# Patient Record
Sex: Male | Born: 1945 | Race: White | Hispanic: No | Smoking: Never smoker
Health system: Southern US, Community
[De-identification: ages and names within clinical notes are randomized; demographics above are authoritative.]

## PROBLEM LIST (undated history)

## (undated) DIAGNOSIS — F32A Depression, unspecified: Secondary | ICD-10-CM

## (undated) DIAGNOSIS — Z22322 Carrier or suspected carrier of Methicillin resistant Staphylococcus aureus: Secondary | ICD-10-CM

## (undated) DIAGNOSIS — R3911 Hesitancy of micturition: Secondary | ICD-10-CM

## (undated) DIAGNOSIS — R600 Localized edema: Secondary | ICD-10-CM

## (undated) DIAGNOSIS — F329 Major depressive disorder, single episode, unspecified: Secondary | ICD-10-CM

## (undated) DIAGNOSIS — F419 Anxiety disorder, unspecified: Secondary | ICD-10-CM

## (undated) DIAGNOSIS — N400 Enlarged prostate without lower urinary tract symptoms: Secondary | ICD-10-CM

## (undated) DIAGNOSIS — E876 Hypokalemia: Secondary | ICD-10-CM

## (undated) DIAGNOSIS — R5381 Other malaise: Secondary | ICD-10-CM

## (undated) DIAGNOSIS — E46 Unspecified protein-calorie malnutrition: Secondary | ICD-10-CM

## (undated) DIAGNOSIS — K5901 Slow transit constipation: Secondary | ICD-10-CM

## (undated) DIAGNOSIS — F1090 Alcohol use, unspecified, uncomplicated: Secondary | ICD-10-CM

## (undated) DIAGNOSIS — D638 Anemia in other chronic diseases classified elsewhere: Secondary | ICD-10-CM

## (undated) DIAGNOSIS — M1990 Unspecified osteoarthritis, unspecified site: Secondary | ICD-10-CM

## (undated) DIAGNOSIS — C801 Malignant (primary) neoplasm, unspecified: Secondary | ICD-10-CM

## (undated) DIAGNOSIS — IMO0002 Reserved for concepts with insufficient information to code with codable children: Secondary | ICD-10-CM

## (undated) DIAGNOSIS — K219 Gastro-esophageal reflux disease without esophagitis: Secondary | ICD-10-CM

## (undated) DIAGNOSIS — Z8739 Personal history of other diseases of the musculoskeletal system and connective tissue: Secondary | ICD-10-CM

## (undated) HISTORY — DX: Anemia in other chronic diseases classified elsewhere: D63.8

## (undated) HISTORY — PX: TONSILLECTOMY: SUR1361

## (undated) HISTORY — DX: Slow transit constipation: K59.01

## (undated) HISTORY — PX: TRIGGER FINGER RELEASE: SHX641

## (undated) HISTORY — DX: Other malaise: R53.81

## (undated) HISTORY — PX: APPENDECTOMY: SHX54

## (undated) HISTORY — DX: Carrier or suspected carrier of methicillin resistant Staphylococcus aureus: Z22.322

## (undated) HISTORY — DX: Unspecified protein-calorie malnutrition: E46

## (undated) HISTORY — PX: KNEE ARTHROSCOPY: SHX127

## (undated) HISTORY — DX: Reserved for concepts with insufficient information to code with codable children: IMO0002

## (undated) HISTORY — PX: CARPAL TUNNEL RELEASE: SHX101

## (undated) HISTORY — PX: PILONIDAL CYST / SINUS EXCISION: SUR543

## (undated) HISTORY — DX: Alcohol use, unspecified, uncomplicated: F10.90

## (undated) HISTORY — PX: KNEE SURGERY: SHX244

## (undated) HISTORY — DX: Benign prostatic hyperplasia without lower urinary tract symptoms: N40.0

## (undated) HISTORY — DX: Localized edema: R60.0

## (undated) HISTORY — DX: Hypokalemia: E87.6

## (undated) HISTORY — PX: JOINT REPLACEMENT: SHX530

## (undated) HISTORY — PX: ENUCLEATION: SHX628

---

## 2002-12-14 ENCOUNTER — Ambulatory Visit (HOSPITAL_BASED_OUTPATIENT_CLINIC_OR_DEPARTMENT_OTHER): Admission: RE | Admit: 2002-12-14 | Discharge: 2002-12-14 | Payer: Self-pay | Admitting: Orthopedic Surgery

## 2002-12-29 ENCOUNTER — Ambulatory Visit (HOSPITAL_BASED_OUTPATIENT_CLINIC_OR_DEPARTMENT_OTHER): Admission: RE | Admit: 2002-12-29 | Discharge: 2002-12-29 | Payer: Self-pay | Admitting: Orthopedic Surgery

## 2003-01-20 ENCOUNTER — Inpatient Hospital Stay (HOSPITAL_COMMUNITY): Admission: RE | Admit: 2003-01-20 | Discharge: 2003-01-26 | Payer: Self-pay | Admitting: Orthopedic Surgery

## 2003-01-20 ENCOUNTER — Encounter (INDEPENDENT_AMBULATORY_CARE_PROVIDER_SITE_OTHER): Payer: Self-pay | Admitting: *Deleted

## 2003-01-20 ENCOUNTER — Encounter: Payer: Self-pay | Admitting: Orthopedic Surgery

## 2003-01-25 ENCOUNTER — Encounter: Payer: Self-pay | Admitting: Orthopedic Surgery

## 2004-09-20 ENCOUNTER — Ambulatory Visit (HOSPITAL_COMMUNITY): Admission: RE | Admit: 2004-09-20 | Discharge: 2004-09-20 | Payer: Self-pay | Admitting: Emergency Medicine

## 2004-11-16 ENCOUNTER — Encounter: Admission: RE | Admit: 2004-11-16 | Discharge: 2004-11-16 | Payer: Self-pay | Admitting: Neurosurgery

## 2005-03-08 ENCOUNTER — Encounter
Admission: RE | Admit: 2005-03-08 | Discharge: 2005-06-06 | Payer: Self-pay | Admitting: Physical Medicine & Rehabilitation

## 2005-03-12 ENCOUNTER — Ambulatory Visit: Payer: Self-pay | Admitting: Physical Medicine & Rehabilitation

## 2005-05-15 ENCOUNTER — Ambulatory Visit: Payer: Self-pay | Admitting: Physical Medicine & Rehabilitation

## 2006-05-08 ENCOUNTER — Ambulatory Visit (HOSPITAL_BASED_OUTPATIENT_CLINIC_OR_DEPARTMENT_OTHER): Admission: RE | Admit: 2006-05-08 | Discharge: 2006-05-08 | Payer: Self-pay | Admitting: Orthopedic Surgery

## 2009-11-15 ENCOUNTER — Encounter: Admission: RE | Admit: 2009-11-15 | Discharge: 2009-11-15 | Payer: Self-pay | Admitting: Emergency Medicine

## 2011-04-26 NOTE — Op Note (Signed)
Jesus Daniel, FITZHENRY                ACCOUNT NO.:  [id number]   MEDICAL RECORD NO.:  [id number]          PATIENT TYPE:  AMB   LOCATION:  DSC                          FACILITY:  [facility]   PHYSICIAN:  Loreta Ave, M.D. DATE OF BIRTH:  07-28-1946   DATE OF PROCEDURE:  05/08/2006  DATE OF DISCHARGE:                                 OPERATIVE REPORT   PREOPERATIVE DIAGNOSIS:  Right knee medial meniscal tear.   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  Grade 2 chondral changes medial and lateral compartment.   OPERATION PERFORMED:  Right knee examination under anesthesia, arthroscopy,  partial medial meniscectomy.  Extensive chondromalacia patella  patellofemoral joint, removal of chondral loose bodies.   SURGEON:  Loreta Ave, M.D.   ASSISTANT:  Genene Churn. Denton Meek.   ANESTHESIA:  General.   BLOOD LOSS:  Minimal.   TOURNIQUET:  Not employed.   SPECIMENS:  None.   CULTURES:  None.   COMPLICATIONS:  None.   DRESSING:  Soft compressive.   DESCRIPTION OF PROCEDURE:  The patient was brought to the operating room and  after adequate anesthesia had been obtained, the right knee examined.  Patellofemoral crepitus but good tracking, no instability, ligaments stable.  Tourniquet and leg holder applied.  Leg prepped and draped in the usual  sterile fashion.  Three portals were created, one superolateral, one each  medial and lateral parapatellar.  Inflow catheter introduced.  Knee  distended.  Arthroscope introduced, knee inspected.  Good patellofemoral  tracking.  Grade 3 change on the patella at the __________ patella debrided.  The trochlea had extensive grade 3 and even some grade 4 changes throughout  full motion.  Chondroplasty to a stable surface.  This was so diffuse that  microfracture is really not indicated.  Hypertrophic synovial tissue,  chondral loose bodies  throughout removed.  Cruciate ligaments intact.  Medial compartment only grade 1 and 2 changes.  Marked complex tearing  entire posterior half medial meniscus.  Really looking at the entire  meniscus was there and had a complex tear.  Posterior half removed.  His  size and the tightness of his knee made this difficult but I could get a  good visualization and removal of posterior half back to stable tissue.  Tapered into remaining meniscus.  Lateral compartment, lateral meniscus all  otherwise looked excellent.  At  completion, the entire knee examined.  No other findings appreciated.  Instruments and fluid removed.  Portals of the knee injected with Marcaine.  Portals were closed with 4-0 nylon.  Sterile compressive dressing applied.  Anesthesia reversed.  Brought to recovery room.  Tolerated surgery well.  No  complications.      Loreta Ave, M.D.  Electronically Signed     DFM/MEDQ  D:  05/08/2006  T:  05/08/2006  Job:  [id number]

## 2011-04-26 NOTE — Procedures (Signed)
Jesus Daniel, Jesus Daniel                ACCOUNT NO.:  [id number]   MEDICAL RECORD NO.:  [id number]          PATIENT TYPE:  REC   LOCATION:  TPC                          FACILITY:  [facility]   PHYSICIAN:  Erick Colace, M.D.DATE OF BIRTH:  Oct 16, 1946   DATE OF PROCEDURE:  04/01/2005  DATE OF DISCHARGE:                                 OPERATIVE REPORT   DATE OF SERVICE:  April 01, 2005.   MEDICAL RECORD NUMBER:  [id number].   DATE OF BIRTH:  1946/08/21.   PROCEDURE:  Right L4 transforaminal epidural sternoid injection under  fluoroscopic guidance.   INDICATION:  Right greater than left-sided low back pain and right thigh  pain.   Only 40% relief with medications, 7/10 pain.  He has had extensive  conservative care.   Informed consent was obtained after describing the risks and benefits of the  procedure to the patient.  These included bleeding, bruising, infection,  loss of bowel and bladder function and temporary or permanent paralysis.  The patient elected to proceed.   DESCRIPTION OF PROCEDURE:  The patient was placed prone on the fluoroscopy  table.  Betadine prep and sterile drape.  A 25 gauge 1-1/4 inch needle was  used to anesthetize the skin and subcutaneous tissues with 1% lidocaine x 2  mL.  Then an 18 gauge 1-1/2 inch introducer needle was utilized and through  this a 22 gauge 5 inch spinal needle was inserted using a bull's eye view in  oblique position.  The needle was advanced and adjusted into the right L4-5  foramen.  The patient experienced some of his typical thigh pain.  The  needle was slightly withdrawn.  AP and lateral views confirmed proper  location.  Omnipaque 180 x 0.5 mL demonstrated good outline of the nerve, as  well as travel up into the epidural area.  Then a solution containing 1 mL  of 40 mg/mL Kenalog plus 1% methylparaben-free lidocaine x 2 mL were  injected.  The patient tolerated the procedure well.  Post injection  instructions given.  The  patient will follow up in three weeks.  If still  good pain relief, will go ahead and not reinject.  If he does have temporary  pain relief, will reinject the same site and if he has suboptimal pain  relief, will have to go down to the L5 level as this appears to be involved  on the MRI.      AEK/MEDQ  D:  04/01/2005 10:32:11  T:  04/01/2005 12:39:11  Job:  [id number]

## 2011-04-26 NOTE — Op Note (Signed)
NAME:  Jesus Daniel, Jesus Daniel                          ACCOUNT NO.:  [id number]   MEDICAL RECORD NO.:  [id number]                   PATIENT TYPE:  OIB   LOCATION:  5015                                 FACILITY:  [facility]   PHYSICIAN:  Dionne Ano. [name], M.D.         DATE OF BIRTH:  [birth year]   DATE OF PROCEDURE:  01/22/2003  DATE OF DISCHARGE:                                 OPERATIVE REPORT   PREOPERATIVE DIAGNOSIS:  Right ring finger peroneal flexor tenosynovitis  with draining sinus, status post incision and drainage January 20, 2003.  The patient presents for secondary wash out and debridement is necessary.   PROCEDURE:  1. Incision and drainage, skin and subcutaneous tissue and flexor tendon     tissue as well as sheath, right ring finger.  2. Synovectomy, flexor digitorum profundus, in the form of a tenosynovectomy     with removal of large amounts of gouty deposits, intratendinous and in     the soft tissues.   SURGEON:  Dionne Ano. Amanda Pea, M.D.   ASSISTANT:  None.   COMPLICATIONS:  None.   ANESTHESIA:  LMA general.   TOURNIQUET TIME:  0.   DRAINS:  Two.   INDICATIONS FOR PROCEDURE:  This patient presents for secondary wash out.  He was incised and drained 48 hours ago secondary to peroneal flexor  tenosynovitis as described in my office note and operative note January 20, 2003.  I have discussed with him all risks and benefits and he desires to  proceed.   OPERATIVE FINDINGS:  This patient had a gouty exudate in the soft tissues  and flexor digitorum profundus.  He had a tense tenosynovitis noted as well.  This was debrided to a clean wound.  I took the tourniquet down during this  and the finger maintained refill.  He still had soft tissue swelling that  was rather exuberant but did not have any signs of extending infection at  this juncture.  The patient does have intratendinous, gouty investments in  the flexor digitorum profundus distally which is worrisome.   The flexor  digitorum profundus is incompetent and was debrided at the first I&D.   OPERATIVE PROCEDURE:  The patient was seen by myself and anesthesia in the  preop holding area.  After a thorough discussion, he was taken to the  operative suite and underwent a smooth induction of general LMA anesthetic.  Following this, the patient was placed supine, appropriately padded, and  prepped and draped in the usual sterile fashion with Betadine scrub and  paint.  Once this was done, I opened the loosely tacked skin and took  intraoperative cultures.  Conditions were more favorable than prior I&D 48  hours ago.  The patient had at this time debridement of skin and  subcutaneous tissue and tendinous tissue.  This was done without difficulty.  Following this, he underwent a tenosynovectomy and tenolysis with removal of  intratendinous gouty deposits distally about the FDP tendon.  Thus, an I&D  was performed as well as a tenosynovectomy with removal of gouty deposits.  I used a combination curette, Therapist, nutritional, and pick ups as well as  scissor tips for the debridement.  I ensured neurovascular bundles were  protected at all times.  The patient tolerated this well and there were no  complications.  He maintained good refill but still had significant swelling  within the soft tissues as expected.  Copious irrigation was applied to the  wound in the form of saline, greater than four liters was placed.  Following  this, I loosely tacked the wound and placed two vessel loop drains.  The  patient was splinted in a position of function and following this, was  splinted with a volar plaster splint.  He was extubated and taken to the  recovery room in stable condition.  He will be continued on IV antibiotics,  elevation, and close observation.  We will plan the final wash out Monday  with definitive closure of a loose nature if conditions are favorable.  We  will continue aggressive approach to try and  return this gentleman to a  quiescent state in terms of the infection and to try and maximize the  potential for use of his ring finger.  All questions have been encouraged  and answered.                                               Dionne Ano. [name], M.D.    [facility]  D:  01/22/2003  T:  01/22/2003  Job:  [id number]   cc:   Almedia Balls. Ranell Patrick, M.D.  [address]  [place]  [place]  [address]  Fax: [phone]

## 2011-04-26 NOTE — Op Note (Signed)
NAME:  LENY, MOROZOV                          ACCOUNT NO.:  [id number]   MEDICAL RECORD NO.:  [id number]                   PATIENT TYPE:  AMB   LOCATION:  DSC                                  FACILITY:  [facility]   PHYSICIAN:  Almedia Balls. Ranell Patrick, M.D.              DATE OF BIRTH:  Apr 29, 1946   DATE OF PROCEDURE:  DATE OF DISCHARGE:  12/29/2002                                 OPERATIVE REPORT   PREOPERATIVE DIAGNOSIS:  Right ring finger infection.   POSTOPERATIVE DIAGNOSIS:  Right ring finger infection.   PROCEDURE:  Right finger irrigation and drainage utilizing Bruner incision  and A-1 pulley incision.   SURGEON:  Almedia Balls. Ranell Patrick, M.D.   FIRST ASSISTANT:  Clarene Reamer, P.A.-C.   ANESTHESIA:  Regional anesthesia with a carpal tunnel block and MAC was  utilized.   ESTIMATED BLOOD LOSS:  None.   TOURNIQUET TIME:  20 minutes.   INSTRUMENT COUNT:  Correct.   COMPLICATIONS:  There were no complications.   ANTIBIOTICS:  Antibiotics were given after cultures were obtained.  Cultures  were sent.   INDICATIONS:  The patient is a 65 year old gentleman who underwent carpal  tunnel release, ring finger and small finger A1 trigger release  approximately one week ago.  The patient presented to the occupational  therapist with some swelling and persistent drainage from his ring finger A1  incision.  The patient also has swelling distally in that finger as well.  He is seen back in orthopedics where he is felt to have an acute infection.  The patient was brought to the operating room for I&D.   DESCRIPTION OF PROCEDURE:  After an adequate level of anesthesia was  achieved, the patient was placed supine on the operating table.  A  nonsterile tourniquet was placed on the forearm.  The right hand was prepped  and draped in the usual sterile fashion.  The hand was elevated for two to  three minutes.  The tourniquet was inflated to 275 mmHg.  A Bruner incision  was created out over the  middle phalanx where there was noted to be redness  and swelling.  There was immediate pus noted.  This was culture, both  anaerobic an aerobic and Gram stain.  Dissection was carried down to the  flexor sheath.  The infection seemed to go directly down to the area just  distal to the A-4 pulley where the tendon was noted to be in good condition  and intact.  The sheath was opened slightly to allow an angiocatheter to be  placed into the sheath for thorough irrigation.  The A-1 pulley incision was  recreated utilizing the knife and blunt dissection with the hemostat.  The  tendon was inspected. There was noted to be some suture material remaining  from the tendon that was repaired.  It was done due to calcific deposits  which developed inside the tendon which  were removed at the initial surgery.  There was noted to be significant purulence noted in the incision which was  thoroughly irrigated out using double antibiotic irrigation.  The tendon  sheath was irrigated using the catheter technique.  Once a complete  irrigation and removal of all foreign material and necrotic material was  performed.  The wounds looked benign.  They were sutured using 4-0 nylon  suture.  The patient was immobilized in a short arm splint, and the  tourniquet was deflated after 25 minutes.  The patient tolerated the surgery  well and was given IV antibiotics, Ancef, following the cultures and will be  placed on oral antibiotics.                                                Almedia Balls. Ranell Patrick, M.D.    SRN/MEDQ  D:  12/31/2002  T:  01/01/2003  Job:  [id number]

## 2011-04-26 NOTE — Op Note (Signed)
NAME:  Jesus Daniel, Jesus Daniel                          ACCOUNT NO.:  [id number]   MEDICAL RECORD NO.:  [id number]                   PATIENT TYPE:  AMB   LOCATION:  DSC                                  FACILITY:  [facility]   PHYSICIAN:  Almedia Balls. Ranell Patrick, M.D.              DATE OF BIRTH:  [birth year]   DATE OF PROCEDURE:  12/14/2002  DATE OF DISCHARGE:                                 OPERATIVE REPORT   PREOPERATIVE DIAGNOSES:  Right hand carpal tunnel syndrome and triggering of  ring finger and small finger.Marland Kitchen   POSTOPERATIVE DIAGNOSES:  Right hand carpal tunnel syndrome and triggering  of ring finger and small finger.   PROCEDURES:  Right carpal tunnel release and A1 pulley release, ring finger  and small finger.   SURGEON:  Almedia Balls. Ranell Patrick, M.D.   ANESTHESIA:  Local anesthesia plus MAC was used.   ESTIMATED BLOOD LOSS:  Minimal.   TOURNIQUET TIME:  41 minutes.   COUNTS:  Instrument count was correct.   COMPLICATIONS:  There were no complications.   MEDICATIONS:  Preoperative antibiotics were given.   INDICATIONS:  The patient is a 65 year old male who presents complaining of  worsening numbness and pain in the right hand.  The patient has a positive  EMG and nerve conduction studies documenting carpal tunnel syndrome.  Despite conservative management, the patient's condition is worse and the  patient desires operative carpal tunnel release.  Informed consent was  obtained.  Risks and benefits were discussed.  Options for management were  discussed.  In addition, the patient has noted worsening triggering in his  ring finger to the point where he cannot fully extend his PIP joint.  He has  developed a slight contracture there.  Occupational therapy has helped  somewhat with that.  In addition, he has had trouble fully flexing the  finger and making a grip.  He has had some small finger triggering as well  and is concerned about that.  In discussing with the patient, he elected to  proceed with A1 pulley release to relieve his triggering in both the ring  and small fingers.  This will all be done at the same procedure.   DESCRIPTION OF PROCEDURE:  After an adequate level of anesthesia was  achieved, the patient was positioned in the supine position and a nonsterile  tourniquet was placed on the right forearm.  The right hand was then prepped  and draped in its entirety in the usual sterile fashion.  A longitudinal  skin incision was made in line with the fourth ray in the palm.  This was  done under loupe magnification.  Dissection was carried sharply down through  the subcutaneous tissues.  The superficial palmar fascia was identified and  divided in line with the skin incision.  Dissection was carried down to the  transverse carpal ligament, which was identified and incised sharply under  direct  loupe magnification.  Care was taken to not injure the median nerve,  which was easily identifiable underneath the transverse carpal ligament and  protected using a Kelly clamp.  Dissection was carried into the midpalmar  space and into the superficial forearm fascia, which was also divided, thus  completing the carpal tunnel release.  The nerve was inspected and noted to  be intact.  Some small fibrous bands were released overlying the nerve,  which seemed to be pressing on the nerve as well.  At this point attention  was directed toward the A1 pulleys of both the ring and small fingers.  Transverse skin incisions were made directly overlying the A1 pulleys.  Again loupe magnification was utilized.  Blunt dissection was carried out  below the skin and the A1 pulleys were visualized and divided under direct  visualization.  There as noted to be extensive tendon degeneration and  fraying at the level of the A1 pulley.  There appeared to be some calcific  deposits present within the tendon itself, and there was a breach in the  tendon on the ring finger.  The calcium was  lavaged out and the rent in the  tendon was repaired using 2-0 Vicryl suture.  The patient was asked while  awake to range his ring and small fingers.  No triggering was noted.  The  wounds were sutured using 4-0 nylon.  Sterile dressings were applied,  followed by a short-arm splint.  The patient was taken to the recovery room,  having tolerated the surgery well.                                               Almedia Balls. Ranell Patrick, M.D.    SRN/MEDQ  D:  12/14/2002  T:  12/14/2002  Job:  [id number]
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
## 2011-04-26 NOTE — Op Note (Signed)
NAME:  Jesus Daniel, Jesus Daniel                          ACCOUNT NO.:  [id number]   MEDICAL RECORD NO.:  [id number]                   PATIENT TYPE:  AMB   LOCATION:  DSC                                  FACILITY:  [facility]   PHYSICIAN:  Almedia Balls. Ranell Patrick, M.D.              DATE OF BIRTH:  Mar 13, 1946   DATE OF PROCEDURE:  12/29/2002  DATE OF DISCHARGE:  12/29/2002                                 OPERATIVE REPORT   PREOPERATIVE DIAGNOSIS:  Infection, right ring finger, post A1 trigger  release.   POSTOPERATIVE DIAGNOSES:  1. Infection, right ring finger.  2. Tophaceous gouty deposits, right ring finger.   PROCEDURE:  Incision and drainage of right ring finger.   SURGEON:  Almedia Balls. Ranell Patrick, M.D.   ASSISTANT:  Clarene Reamer, P.A.-C.   ANESTHESIA:  Regional anesthesia plus MAC was utilized.   ESTIMATED BLOOD LOSS:  Minimal.   TOURNIQUET TIME:  32 minutes.   Instrument count was correct.  There were no complications.   Antibiotics were given after cultures were sent.   INDICATIONS:  The patient is a 65 year old male who is now 2-1/2 weeks out  from carpal tunnel release and A1 release at the right ring finger and small  finger.  The patient has had a postoperative course complicated by drainage  from his A1 incision to the ring finger as well as swelling present along  the flexor sheath near the middle phalanx of his ring finger.  The patient  presents afebrile, complaining of mild-to-moderate pain and swelling in the  right ring finger.  Due to concerns over likely infection with persistent  drainage from his A1 incision and failure of this incision to seal as well  as the swelling and erythema distally, he has decided to proceed to the  operating room for irrigation and debridement of his finger for presumed  infection.   DESCRIPTION OF PROCEDURE:  After an adequate level of anesthesia was  achieved, the patient was positioned supine on the operating table.  A non-  sterile  tourniquet was placed on the right proximal arm.  The right arm was  then prepped and draped in its entirety in the usual sterile fashion.  The  patient's prior A1 incision was re-opened utilizing a sharp knife.  A  hemostat was then used to spread down to the level of the A1 pulley.  Care  was taken to be wary of the digital artery and nerve bundles, passing both  radially and ulnarly to the incision.  Blunt retractors were utilized.  There was noted to be an area of necrosis and significant tophaceous  deposits present in the A1 incision.  These were all debrided sharply  utilizing tenotomy scissors under loupe magnification.  A thorough  debridement of all devitalized tissue was performed.  At this point, a  Bruner incision was created overlying the middle phalanx distally on the  finger.  This was done utilizing a 15-blade scalpel.  Dissection was carried  sharply down through subcutaneous tissues using tenotomy scissors such that  a full-thickness flap could be opened over the flexor sheath.  Upon entering  the level of the flexor sheath, a large amount of tophaceous purulent  material was expressed which was sent for culture, both aerobic and  anaerobic as well as a stat Gram-stain.  The full-thickness flap was  retracted, giving good visualization of the middle phalanx.  Care was taken  to preserve the A4 pulley, and the sheath was opened up allowing for  thorough inspection of the tendon.  There was noted to be tophaceous  deposits within the tendon sheath and within the tendon, the flexor  digitorum superficialis tendon itself.  Some attritional damage to the  tendon was noted; however, the tendon was basically in continuity at this  point.  An 18-gauge Angiocath catheter was utilized to irrigate, with double-  antibiotic irrigation, the tendon sheath until the irrigation was noted to  be clear coming from the palmar incision.  Approximately two liters of  irrigation were utilized  during the surgery.  Again devitalized tissue and  tophaceous material were removed from both incisions.  At the completion of  the irrigation and debridement, the tendons appeared clean with no evidence  of purulent material or tophaceous material.  The patient's A4 pulley was  noted to be intact.  The opening in the patient's tendon sheath was left  open to drain.  The distal incision was closed using nylon suture, and a  gauze wick was placed into the palmar incision to facilitate drainage in the  palmar incision.  The patient's A1 incision was not closed but left open to  drain.  A short-arm splint was applied to immobilize the hand and the ring  finger.  The patient was then awakened and taken to the recovery room after  deflating the tourniquet at 32 minutes.  We will check the cultures and  place him on appropriate IV antibiotics.                                               Almedia Balls. Ranell Patrick, M.D.    SRN/MEDQ  D:  01/25/2003  T:  01/25/2003  Job:  [id number]

## 2011-04-26 NOTE — Op Note (Signed)
NAME:  Jesus Daniel, Jesus Daniel                          ACCOUNT NO.:  [id number]   MEDICAL RECORD NO.:  [id number]                   PATIENT TYPE:  OIB   LOCATION:  5015                                 FACILITY:  [facility]   PHYSICIAN:  Dionne Ano. [name], M.D.         DATE OF BIRTH:  1946/04/07   DATE OF PROCEDURE:  DATE OF DISCHARGE:                                 OPERATIVE REPORT   PREOPERATIVE DIAGNOSES:  1. Right ring finger purulent flexor tenosynovitis with draining sinus and     cultures less than 24 hours ago revealing Gram positive cocci.  2. History of previous A1 pulley release with postoperative course requiring     I&D, 15 days after the index procedure.   POSTOPERATIVE DIAGNOSES:  Purulent flexor tenosynovitis right ring finger  with abundant tophaceous and purulent material note.  Flexor digitorum  profundus was noted to have attritional tearing in it secondary to the  process.  The flexor digitorum superficialis was ruptured secondary to  attritional tearing to the process.   PROCEDURE:  1. Flexor digitorum profundus tenolysis and tenosynovectomy right ring     finger.  2. Flexor digitorum superficialis debridement and tenosynovectomy with     resection of the proximal stump which was allowed to retract.  This was     an incompetent tendon.  3. Irrigation and debridement right ring finger skin, subcutaneous tissue,     muscle and tendinous tissue.  4. Exploration of digital nerve and artery right ring finger.   SURGEON:  Dionne Ano. Amanda Pea, M.D.   ASSISTANT:  Karie Chimera, P.A.-C.   DRAINS:  Two.   COMPLICATIONS:  None.   ESTIMATED BLOOD LOSS:  Minimal.   ANESTHESIA:  Wrist block with heavy IV sedation.   TOURNIQUET TIME:  1 hour.   INDICATIONS:  This patient is a 65 year old male who  underwent carpal  tunnel release and ring finger A1 pulley release December 14, 2002.  His  course was complicated by infection which was noted and a drain December 29, 2002,  by Dr. Ranell Patrick.  The remainder of the procedure was done by Dr. Beverely Low as well.  Following this I&D on December 29, 2002, the patient was  followed by Dr. Ranell Patrick.  I was asked to see the patient today due to the  fact that this patient had greater than 24 hours of worsening swelling pain  and discomfort.  I have evaluated him in my office this morning and have  scheduled him for surgery this afternoon due to positive Kanavel's signs, a  draining sign and a culture taken by Leilani Able, P.A.-C. in the office at  [facility] that revealed Gram-positive cocci, less than 12  hours ago.  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  I have  discussed all issues with the patient and his  family.  The patient is  understandably anxious about his finger as I have discussed with him there  are risks of bleeding, infection, anesthesia, damage to normal structures  and failure of surgery to accomplish intended goals in relieving symptoms  and restoring function.  There is also a risk of finger amputation due to  the infectious process.  The patient and I also discussed possible poor  function in the finger due to the fact that he may have significant  attritional tearing along the flexor tendon system.  I went over risks and  benefits with him at length and all questions were encouraged and answered.  I also discussed all risks and benefits with his wife and family.  Our goal  is to try and eradicate this infection and give him the most functional  finger based upon conditions.  The patient understands that is absolutely no  guarantees.  I have had a very long and detailed conversation, greater than  one hour with the patient both in my office and preoperatively in the  holding area.  I have also singularly discussed all issues with his wife.   OPERATIVE FINDINGS:  This patient had purulent flexor  tenosynovitis.  He had  a large investment of tophaceous gouty deposits along the flexor tendon  sheath.  The FDS was attritionally torn and not functional and was debrided  and resected and allowed to retract proximally.  The flexor digitorum  profundus was intact proximally.  Distally it had a significant fraying,  however, the distal attachments appeared to be somewhat intact although  certainly not normal by any means.  The ulnar digital artery was therefore  explored and due to the proximity and prior incisions.  These were then  tagged carefully and I dissected this.  Multiple cultures were taken.   DESCRIPTION OF PROCEDURE:  The patient was seen by myself and anesthesia.  A  lengthy discussion ensued preoperatively followed by a wrist block by Dr.  Laverle Hobby.  Once this was done, the patient was taken to the operative  suite.  I did previously hang IV antibiotics on this gentleman, and he was  previously on Keflex for less than 16 hours prior to surgery.  The patient  and I discussed the procedure at length preoperatively.  Once in the  operative suite, he was laid supine on the table, appropriately padded,  prepped and draped in the usual sterile fashion with heavy IV sedation.  The  tourniquet was applied to the upper extremity.  Once this was done, I made  outlying marks and examined the finger.  The finger did have a draining  sinus tract overlying the A1 pulley.  Purulent material had drained, and  this was cultured.  Once this was done, the sterile field was secured and  incision was made.  The patient had a draining incision made up the finger.  I based this primarily ulnarly so that I could lengthen this if necessary.  The skin was rather tight and erythematous.  In addition to this, there was  a large amount of scar tissue and edema with proteinaceous exudate noted in  the soft tissue subcu.  I opened the finger proximally.  I first noted purulent and tophaceous  deposits.  I cultured this readily with aerobic and  anaerobic cultures.  Following this, skin flaps were elevated and I  identified the flexor tendon sheaths and the flexor tendons themselves from  the DIP crease and beyond to the mid  palm region well above the A1 pulley.  Given the proximity of the digital nerve and artery I identified these  structures and dissected them under loop magnification and protected them at  all times.  The radial digital nerve and artery were protected with soft  tissue.  There was a large amount of crusting necrotic tissue which I  removed in the A1 pulley region as well as throughout the tendon sheath.  There was a huge amount of synovitis and tophaceous deposits as well as  purulent fluid.  This was cultured to my satisfaction.  I obtained  additional cultures at this time.  Thus, three sets of cultures were taken.  The patient had the previous A1 pulley resection site identified.  This  resection site was filled with synovitis and tophaceous fluid.  This was  debrided and a A3 sheath incision was made and the A3 sheath was resected.  I took care to preserve the A2 and A4 pulleys.  Following this I performed  an extensive debridement of nonviable tissue.  This debridement included the  skin and subcutaneous tissue and muscle type tissue.  An extensive  tenosynovectomy and tenolysis of the SVS tendon was performed removing all  nonviable tissue from it.  There was noted significant fraying of the flexor  digitorum profundus distally.  The flexor digitorum superficialis was  attritionally torn and essentially ruptured.  Thus this was debrided and  allowed to retract proximally.  I pulled the tendon distally until healthy  appearing fibers were present and allowed this to retract proximally after  it was severed.  No purulent material was removed.  Following this, I  performed copious irrigation with greater than 4-5 liters of fluid.  This  was poured into the  wound.  I removed all infectious appearing tissue and  necrotic areas.  Once this was done, the tourniquet was deflated.  I sure  note that the tourniquet was deflated during the lateral half of the  irrigation and the finger pinked up well without difficulty.  The patient  did have some bleeding edges which were controlled with pressure and bipolar  electrocautery.  I then loosely closed the wound over a #17 TLS drain and  placed multiple vessel loops in the finger.  The patient tolerated this  well.  Following this, I then placed Xeroform over the proximal drain site  and Adaptic over the skin.  I made sure that the flexor tendon was covered  but left the wound fairly loosely closed to allow free egress of fluid.  The  patient had wet-to-dry dressing and Adaptic placed and following this a  splint was applied.  The drain was hooked up to suction, and the patient  awoke from anesthesia.  Drapes were removed and he was taken to the recovery room.  We will plan elevation, IV antibiotics, close management and return  him to the operative theatre Saturday for second I&D.  I have discussed with  him that this is likely going to be a rather drawn out process given the  infection and the intraoperative conditions noted.  I could not guarantee  him salvage of his finger, but I assured him that we will do everything in  our power to try and restore function and health to the finger and hand.  I  have discussed with he and the family all issues.  Postoperative plans were  written in the chart.  All questions have been encouraged and answered.  Dionne Ano. [name], M.D.    [facility]  D:  01/20/2003  T:  01/21/2003  Job:  [id number]   cc:   Almedia Balls. Ranell Patrick, M.D.  [address]  [place]  [place]  [address]  Fax: [phone]

## 2011-04-26 NOTE — Procedures (Signed)
[name], Jesus Daniel                ACCOUNT NO.:  [id number]   MEDICAL RECORD NO.:  [id number]          PATIENT TYPE:  REC   LOCATION:  TPC                          FACILITY:  [facility]   PHYSICIAN:  Erick Colace, M.D.DATE OF BIRTH:  March 06, 1946   DATE OF PROCEDURE:  04/22/2005  DATE OF DISCHARGE:                                 OPERATIVE REPORT   PROCEDURE:  Right L4 transforaminal epidural steroid injection under  fluoroscopic guidance.   INDICATIONS:  Right L4 radiculopathy with MRI findings showing L4-5 disk  compressing L4 and to a lesser extent L5 nerve root.   Informed consent was obtained after describing risks and benefits of the  procedure.  These include bleeding, bruising, infection, loss of bowel and  bladder function, death, seizures, with potential benefits of improvement of  pain.  Had previous improvement for several days after the last injection  approximately one month ago.   The patient placed prone on the fluoroscopy table, Betadine prep, sterile  drape.  A 25-gauge 1-1/2 inch needle was used to anesthetize skin and subcu  tissue with 1% lidocaine x2 mL.  Then a 22-gauge five-inch spinal needle was  inserted under fluoroscopic guidance into the right L4-5 foramen on the  right side.  Both AP, lateral and oblique images were utilized.  Once proper  needle position was obtained, then Omnipaque 180 with IV extension tubing  was injected under live fluoroscopy.  Good epidural and nerve root flow was  seen and then using IV extension tubing and keeping needle in the same  position, 1 mL of 40 mg/mL Kenalog with 2 mL 1% methylparaben-free lidocaine  were injected.  The patient tolerated the procedure well.  Post-injection  instructions given.      AEK/MEDQ  D:  04/22/2005 17:15:32  T:  04/23/2005 06:12:14  Job:  [id number]   cc:   Hilda Lias, M.D.  [address]  [place], [place] [address]  Fax: [phone]

## 2011-04-26 NOTE — Procedures (Signed)
Jesus Daniel, Jesus Daniel                ACCOUNT NO.:  [id number]   MEDICAL RECORD NO.:  [id number]          PATIENT TYPE:  REC   LOCATION:  TPC                          FACILITY:  [facility]   PHYSICIAN:  Erick Colace, M.D.DATE OF BIRTH:  1946/04/26   DATE OF PROCEDURE:  05/16/2005  DATE OF DISCHARGE:                                 OPERATIVE REPORT   MEDICAL RECORD NUMBER:  [id number].   PROCEDURE:  Right L5 transforaminal epidural steroid injection under  fluoroscopic guidance.   INDICATIONS:  Right L4 and L5 radiculopathy, MRI findings showing L4-5 disk  compressing L4 and L5 nerve root. These only had minimal relief with  previous two L4 transforaminal epidural steroid injections.   Informed consent was obtained after describing risks and benefits of the  procedure to the patient. These include bleeding, bruising, infection, loss  of bowel and bladder function, or no improvement in pain. The previous  improvement as noted above which was temporary after the infection April 01, 2005.   The patient placed prone on fluoroscopy table, Betadine prep, sterile drape.  A 25-gauge 1-1/4-inch needle was used to anesthetize skin and subcu tissues  with 1% lidocaine x2 cc. A 22-gauge 5-inch spinal needle was inserted under  fluoroscopic guidance into the right L5-S1 foramen. Both AP, lateral, and  oblique images were utilized. Once proper needle position was obtained, then  Omnipaque 180 using IV extension tubing was injected under live fluoroscopy  x0.5 cc. Good epidural nerve root flow was seen, and keeping the needle in  the same position using IV extension tubing, 1 mL of 40 mg/mL of Kenalog and  2 mL of 1% methylparaben-free lidocaine were injected. The patient tolerated  the procedure well. Post injection instructions were given.       AEK/MEDQ  D:  05/16/2005 16:45:43  T:  05/17/2005 07:48:15  Job:  [id number]   cc:   Brett Canales A. Cleta Alberts, M.D.  [address]  [place]  [place] [address]  Fax: [phone]

## 2011-04-26 NOTE — Op Note (Signed)
NAME:  Jesus Daniel, Jesus Daniel                          ACCOUNT NO.:  [id number]   MEDICAL RECORD NO.:  [id number]                   PATIENT TYPE:  OIB   LOCATION:  5015                                 FACILITY:  [facility]   PHYSICIAN:  Dionne Ano. [name], M.D.         DATE OF BIRTH:  1946-01-21   DATE OF PROCEDURE:  01/24/2003  DATE OF DISCHARGE:                                 OPERATIVE REPORT   PREOPERATIVE DIAGNOSIS:  Status post incision and drainage of right ring  finger tenosynovitis with gouty deposition presents for third washout.   POSTOPERATIVE DIAGNOSIS:  Status post incision and drainage of right ring  finger tenosynovitis with gouty deposition presents for third washout.   OPERATION PERFORMED:  1. Incision and drainage of skin and subcutaneous, tendinous tissue, and     sheath tissue of right ring finger.  2. Removal of tophaceous deposits/mass flexor digitorum profundus, with     debridement of flexor digitorum profundus.  3. Repair of flexor digitorum profundus, longitudinal defect with 4-0     Ethibond sutures.   SURGEON:  Dionne Ano. Amanda Pea, M.D.   ASSISTANT:  Karie Chimera, P.A.-C.   ANESTHESIA:  General.   TOURNIQUET TIME:  Zero.   COMPLICATIONS:  None.   CULTURES:  One.   DRAINS:  Two vessel loop drains.   INDICATIONS FOR PROCEDURE:  The patient is a 65 year old male who presents  with the above mentioned diagnosis who presents for a third and hopefully a  final washout if conditions permit.  He understands the risks and benefits  of surgery including the risk of infection, bleeding, anesthesia, damage to  normal structures and failure of surgery to accomplish its intended goals of  relieving symptoms and restoring function.  The patient has been growing  Staphylococcus aureus, which is oxacillin sensitive.  The patient has been  on appropriate Ancef antibiotics.  Lung conditions have continued to  improve.  He unfortunately had significant gouty  arthropathy and is being  maximized in terms of his management for this at this time.  The operative  procedure was discussed with the patient and he understands all risks and  benefits.   OPERATIVE FINDINGS:  The patient had gouty deposition with significantly  less re-accumulation.  I performed a debridement of the FDP tendon where a  longitudinal tophaceous deposit deposition process had been aggressively  treated on the first and second washouts.  I removed diseased tissue back to  normal tendinous tissue and then repaired the tendon about the longitudinal  rent.  The transverse competency was not compromised.  I did perform a  traction check or the flexor tendons and noted that the patient did have A-2  pulley preserved and did have excursion capabilities to the flexor digitorum  profundus.  The flexor digitorum superficialis of course, was incompetent  and previously debrided.   DESCRIPTION OF PROCEDURE:  The patient was seen by myself and anesthesia,  taken to the operative suite and had smooth induction of general anesthetic  and following this was laid supine, appropriately padded and prepped and  draped with Betadine scrub and paint and appropriate sterile field was  secured.  The patient had the wound opened and conditions looked well.  There was no purulence obvious purulent discharge.  The infection seemed to  have cleared nicely.  The tophaceous deposits were much improved in terms of  their accumulation.  At this point, I performed incision and drainage of the  subcutaneous tissue, tendinous tissue, and tendon sheath tissue  about the  finger.  Once this was done, I then debrided tophaceous deposits in the  skin, subcu and intratendinous region.  The longitudinal split in the FDP  previously debrided was re-debrided.  It was meticulously debrided until  adequate tendon ends were available and appeared to viable.   At this point, we then performed a repair with 4-0  Ethibond about the  longitudinal split.  This was a flexor tendon repair in zone-2 for the most  part given longitudinal abnormality.  I then performed a flexor check at the  palm level indicating the A-2 pulley was intact.  The FDP did have good  excursion and FDS, of course, was previously debrided.  Following this, and  performing a manipulation of the finger, I then irrigated the wound once  again copiously.  A total of greater than 3L was used for irrigation.  Following this, the wound was then closed over vessel loop drains.  I closed  this so that the tendon would be totally covered.  The patient maintained  excellent refill, no tourniquet was needed.  We will plan for this to be the  final washout and we will proceed with continued IV antibiotics for four  weeks in the form of Ancef, followed by a p.o. regimen for two months.  I  have asked infectious disease to see this patient and they have kindly seen  him and left their recommendations per Dr. Roxan Hockey.  We will continue an  aggressive approach to this gentleman's care.  All questions were encouraged  and answered.                                                Dionne Ano. [name], M.D.    [facility]  D:  01/24/2003  T:  01/24/2003  Job:  [id number]   cc:   Almedia Balls. Ranell Patrick, M.D.  [address]  [place]  [place]  [address]  Fax: [phone]   Rockey Situ. Flavia Shipper., M.D.  [address]  [place]  [place] [address]  Fax: [phone]

## 2011-04-26 NOTE — Discharge Summary (Signed)
NAME:  Jesus Daniel, Jesus Daniel                          ACCOUNT NO.:  [id number]   MEDICAL RECORD NO.:  [id number]                   PATIENT TYPE:  INP   LOCATION:  5015                                 FACILITY:  [facility]   PHYSICIAN:  Dionne Ano. Amanda Pea, M.D.             DATE OF BIRTH:  12-21-1945   DATE OF ADMISSION:  01/20/2003  DATE OF DISCHARGE:  01/26/2003                                 DISCHARGE SUMMARY   ADMISSION DIAGNOSES:  1. Right ring finger purulent flexor tenosynovitis.  2. History of tophaceous gout.   DISCHARGE DIAGNOSES:  1. Right ring finger purulent flexor tenosynovitis.  2. History of tophaceous gout.   PROCEDURE:  I&D x 3 to right hand.  Please see operative reports for further  details.   SURGEON:  Dionne Ano. Amanda Pea, M.D.   ASSISTANT:  Karie Chimera, P.A.-C.   CONSULTATIONS:  Infectious disease, Dr. Cliffton Asters, M.D.   BRIEF HISTORY:  (Per the patient's family)  The patient is a pleasant 65-  year-old gentleman who presented for evaluation to Dr. Dionne Ano. Gramig for  his right upper extremity predicament.  The patient was noted to have  underwent an A1 pulley release on December 14, 2002 as well as carpal tunnel  release.  Subsequently, he developed infection and underwent an I&D per Dr.  Almedia Balls. Ranell Patrick on December 29, 2002.  He was his original Paramedic.   The patient's right upper extremity unfortunately progressed with swelling,  pain, and discomfort.  He was diagnosed with a cellulitis as well as  purulent flexor tenosynovitis.  Cultures were taken and revealed gram-  positive cocci.  Therefore, the patient was admitted to undergo these  necessary IV antibiotics, close observation, and appropriate antibiotic  regimen with consultation with infectious disease.   HOSPITAL COURSE:  The patient was admitted on January 20, 2003 and  underwent a series of three I&Ds (please see operative reports for details  per Dr. Dionne Ano. Gramig).  The patient  was prophylactically started on an  antibiotic treatment regimen consisting of IV Keflex until final cultures  were obtained.  Infectious disease was consulted and then followed closely  along monitoring the patient's progress as well as his laboratory results.  He did note that group B streptococcus was obtained from the culture as well  as staphylococcus aureus.  The patient continued on Ancef throughout his  postoperative course.   He underwent a total of three I&Ds with loose closure on the third  procedure.  The patient's condition continued to improve.  His cellulitis  did resolve.  The patient was known to have a history of tophaceous gout and  was treated appropriately throughout his hospital course.  Final cultures  did reveal group B streptococcus as well as Staphylococcus aureus.  Appropriate treatment regimen as determined by infectious disease consisted  of Ancef during the hospital course.  Upon discharge, he would have  a PICC  line placed for Rocephin IV q.d. 1 gm for four weeks and then continue a  dose of Tequin 400 mg p.o. q.d. for four weeks.   On January 26, 2003, the patient was progressing quite well.  He was very  eager to go home.  His temperature was 99.1, blood pressure was stable and  vital signs were stable.  His wounds were still without signs of any active  infection.  His cellulitis had resolved.  Due to his course of antibiotics,  home health care was consulted for management of this.  A PICC line was  placed prior to his discharge with appropriate teaching.  Again, his  evaluation of his right hand once the drains and dressing removed, showed  the incision was clean, dry, and intact.  The sutures were intact.  There  was no active discharge or erythema.  His edema had greatly improved.  Sensation was intact and neurovascularly he was intact.  Due to his improved  condition and his final culture results, as well as a home treatment  regimen, the patient  was discharged in an improved condition.   ASSESSMENT:  1. Status post multiple incision and drainages to the right hand x 3     secondary to right ring finger flexor tenosynovitis and history of     tophaceous gout.   CONDITION ON DISCHARGE:  Improved.   DISCHARGE INSTRUCTIONS:  Home health care is consulted for PICC education.  His diet will be a regular diet.  He is to avoid any alcoholic beverages due  to his history of gout.  He will keep his dressing clean, dry, and intact.  He will elevate his upper extremity frequently and move his fingers  frequently as well.  Home health care will provide wound care.   DISCHARGE MEDICATIONS:  1. Rocephin 1 gm IV q.d. per PICC line x 4 weeks.  2. Then he will get a course of Tequin 400 mg for 4 weeks p.o. q.d.  3. Percocet 1-2 p.o. q.4-6h. p.r.n. #40.  4. Robaxin 1 p.o. q.6-8h. p.r.n. spasm #40 dose.  5. Peri-Colace 100 mg p.o. b.i.d. #60.   FOLLOW UP:  The patient will follow up with Dr. Dionne Ano. Gramig the  following Friday.  He is to call [phone] for an appointment.  He will see a  therapist one hour after the above appointment and call [phone] and come by  to schedule this.  He will follow up with Dr. Cliffton Asters per his  recommendations.     Karie Chimera, P.A.-C.                   Dionne Ano. Amanda Pea, M.D.    BB/MEDQ  D:  03/22/2003  T:  03/22/2003  Job:  [id number]

## 2011-05-11 ENCOUNTER — Emergency Department (HOSPITAL_COMMUNITY)
Admission: EM | Admit: 2011-05-11 | Discharge: 2011-05-11 | Disposition: A | Discharge status: Home / Self Care | Payer: 59 | Attending: Emergency Medicine | Admitting: Emergency Medicine

## 2011-05-11 ENCOUNTER — Emergency Department (HOSPITAL_COMMUNITY): Payer: 59

## 2011-05-11 DIAGNOSIS — I10 Essential (primary) hypertension: Secondary | ICD-10-CM | POA: Insufficient documentation

## 2011-05-11 DIAGNOSIS — R002 Palpitations: Secondary | ICD-10-CM | POA: Insufficient documentation

## 2011-05-11 DIAGNOSIS — E7800 Pure hypercholesterolemia, unspecified: Secondary | ICD-10-CM | POA: Insufficient documentation

## 2011-05-11 LAB — COMPREHENSIVE METABOLIC PANEL
BUN: 5 mg/dL — ABNORMAL LOW (ref 6–23)
CO2: 30 mEq/L (ref 19–32)
Chloride: 102 mEq/L (ref 96–112)
Creatinine, Ser: 0.77 mg/dL (ref 0.4–1.5)
GFR calc non Af Amer: >60 mL/min (ref >60)
Total Bilirubin: 0.6 mg/dL (ref 0.3–1.2)

## 2011-05-11 LAB — DIFFERENTIAL
Basophils Absolute: 0 10*3/uL (ref 0.0–0.1)
Basophils Relative: 0 % (ref 0–1)
Eosinophils Absolute: 0.1 10*3/uL (ref 0.0–0.7)
Eosinophils Relative: 1 % (ref 0–5)
Lymphs Abs: 3.7 10*3/uL (ref 0.7–4.0)
Monocytes Absolute: 0.7 10*3/uL (ref 0.1–1.0)
Monocytes Relative: 9 % (ref 3–12)

## 2011-05-11 LAB — CBC
HCT: 44.5 % (ref 39.0–52.0)
Hemoglobin: 14.7 g/dL (ref 13.0–17.0)
MCHC: 33 g/dL (ref 30.0–36.0)
MCV: 103.5 fL — ABNORMAL HIGH (ref 78.0–100.0)
Platelets: 225 10*3/uL (ref 150–400)
RBC: 4.3 MIL/uL (ref 4.22–5.81)
RDW: 12.9 % (ref 11.5–15.5)

## 2012-01-21 ENCOUNTER — Ambulatory Visit: Payer: 59 | Attending: Diagnostic Neuroimaging | Admitting: *Deleted

## 2012-01-21 DIAGNOSIS — M545 Low back pain, unspecified: Secondary | ICD-10-CM | POA: Insufficient documentation

## 2012-01-21 DIAGNOSIS — IMO0001 Reserved for inherently not codable concepts without codable children: Secondary | ICD-10-CM | POA: Insufficient documentation

## 2012-01-28 ENCOUNTER — Ambulatory Visit: Payer: 59 | Admitting: *Deleted

## 2012-01-30 ENCOUNTER — Ambulatory Visit: Payer: 59 | Admitting: *Deleted

## 2012-02-04 ENCOUNTER — Ambulatory Visit: Payer: 59 | Admitting: *Deleted

## 2012-02-06 ENCOUNTER — Ambulatory Visit: Payer: 59 | Admitting: *Deleted

## 2012-02-11 ENCOUNTER — Ambulatory Visit: Payer: 59 | Attending: Diagnostic Neuroimaging | Admitting: *Deleted

## 2012-02-11 DIAGNOSIS — M545 Low back pain, unspecified: Secondary | ICD-10-CM | POA: Insufficient documentation

## 2012-02-11 DIAGNOSIS — IMO0001 Reserved for inherently not codable concepts without codable children: Secondary | ICD-10-CM | POA: Insufficient documentation

## 2012-02-13 ENCOUNTER — Ambulatory Visit: Payer: 59 | Admitting: *Deleted

## 2012-02-18 ENCOUNTER — Ambulatory Visit: Payer: 59 | Admitting: *Deleted

## 2012-02-20 ENCOUNTER — Ambulatory Visit: Payer: 59 | Admitting: *Deleted

## 2012-04-05 ENCOUNTER — Ambulatory Visit (INDEPENDENT_AMBULATORY_CARE_PROVIDER_SITE_OTHER): Payer: 59 | Admitting: Internal Medicine

## 2012-04-05 VITALS — BP 143/84 | HR 103 | Temp 97.6°F | Resp 18 | Ht 71.0 in | Wt 254.0 lb

## 2012-04-05 DIAGNOSIS — I10 Essential (primary) hypertension: Secondary | ICD-10-CM

## 2012-04-05 DIAGNOSIS — S61209A Unspecified open wound of unspecified finger without damage to nail, initial encounter: Secondary | ICD-10-CM

## 2012-04-05 DIAGNOSIS — Z23 Encounter for immunization: Secondary | ICD-10-CM

## 2012-04-05 DIAGNOSIS — S61009A Unspecified open wound of unspecified thumb without damage to nail, initial encounter: Secondary | ICD-10-CM

## 2012-04-05 NOTE — Progress Notes (Signed)
  Subjective:    Patient ID: Margaretha Seeds, male    DOB: Dec 09, 1946, 66 y.o.   MRN: [id number]  HPI At home and cut end of thumb pad, avulsion wound distal   Review of Systems     Objective:   Physical Exam Wound thumb needs repair No NMV loss       Assessment & Plan:  Wound repair Tdap update

## 2012-04-05 NOTE — Progress Notes (Signed)
   Patient ID: MOISES TERPSTRA MRN: [id number], DOB: March 31, 1946, 66 y.o. Date of Encounter: 04/05/2012, 12:45 PM   PROCEDURE NOTE: Verbal consent obtained. Sterile technique employed. Numbing: Anesthesia obtained with 2% plain lidocaine  Cleansed with soap and water. Irrigated. Betadine prep per usual protocol.  Wound explored, no deep structures involved, no foreign bodies.   Wound repaired with # 4.0 prolene 6 simple interrupted sutures Hemostasis obtained. Wound cleansed and dressed.  Wound care instructions including precautions covered with patient. Handout given.  Anticipate suture removal in 7 days  Procedure performed by Rhoderick Moody, PA-C Procedure observed and inspected by myself. Agree with above.  SignedEula Listen, PA-C 04/05/2012 12:45 PM

## 2012-04-05 NOTE — Patient Instructions (Signed)
Wound Care Wound care helps prevent pain and infection.  You may need a tetanus shot if:  You cannot remember when you had your last tetanus shot.   You have never had a tetanus shot.   The injury broke your skin.  If you need a tetanus shot and you choose not to have one, you may get tetanus. Sickness from tetanus can be serious. HOME CARE   Only take medicine as told by your doctor.   Clean the wound daily with mild soap and water.   Change any bandages (dressings) as told by your doctor.   Put medicated cream and a bandage on the wound as told by your doctor.   Change the bandage if it gets wet, dirty, or starts to smell.   Take showers. Do not take baths, swim, or do anything that puts your wound under water.   Rest and raise (elevate) the wound until the pain and puffiness (swelling) are better.   Keep all doctor visits as told.  GET HELP RIGHT AWAY IF:   Yellowish-white fluid (pus) comes from the wound.   Medicine does not lessen your pain.   There is a red streak going away from the wound.   You cannot move your finger or toe.   You have a fever.  MAKE SURE YOU:   Understand these instructions.   Will watch your condition.   Will get help right away if you are not doing well or get worse.  Document Released: 09/03/2008 Document Revised: 11/14/2011 Document Reviewed: 03/31/2011 [employer] Patient Information 2012 [employer]. 

## 2014-03-09 ENCOUNTER — Encounter: Payer: Self-pay | Admitting: Gastroenterology

## 2014-11-09 ENCOUNTER — Other Ambulatory Visit: Payer: Self-pay | Admitting: Orthopaedic Surgery

## 2014-11-09 DIAGNOSIS — W3301XA Accidental discharge of shotgun, initial encounter: Secondary | ICD-10-CM

## 2014-11-09 DIAGNOSIS — M419 Scoliosis, unspecified: Secondary | ICD-10-CM

## 2014-11-18 ENCOUNTER — Ambulatory Visit
Admission: RE | Admit: 2014-11-18 | Discharge: 2014-11-18 | Disposition: A | Discharge status: Home / Self Care | Payer: Commercial Managed Care - HMO | Source: Ambulatory Visit | Attending: Orthopaedic Surgery | Admitting: Orthopaedic Surgery

## 2014-11-18 DIAGNOSIS — W3301XA Accidental discharge of shotgun, initial encounter: Secondary | ICD-10-CM

## 2014-11-18 DIAGNOSIS — M419 Scoliosis, unspecified: Secondary | ICD-10-CM

## 2015-07-04 ENCOUNTER — Other Ambulatory Visit: Payer: Self-pay

## 2015-09-26 ENCOUNTER — Other Ambulatory Visit: Payer: Self-pay | Admitting: Physician Assistant

## 2015-09-26 NOTE — H&P (Signed)
TOTAL HIP ADMISSION H&P  Patient is admitted for right total hip arthroplasty.  Subjective:  Chief Complaint: right hip pain  HPI: Jesus Daniel, 69 y.o. male, has a history of pain and functional disability in the right hip(s) due to arthritis and patient has failed non-surgical conservative treatments for greater than 12 weeks to include NSAID's and/or analgesics and corticosteriod injections.  Onset of symptoms was gradual starting 2 years ago with rapidlly worsening course since that time.The patient noted no past surgery on the right hip(s).  Patient currently rates pain in the right hip at 10 out of 10 with activity. Patient has night pain, worsening of pain with activity and weight bearing, trendelenberg gait, pain that interfers with activities of daily living and pain with passive range of motion. Patient has evidence of subchondral cysts, subchondral sclerosis, periarticular osteophytes and joint space narrowing by imaging studies. This condition presents safety issues increasing the risk of falls.  There is no current active infection.  Patient Active Problem List   Diagnosis Date Noted  . HTN (hypertension) 04/05/2012   No past medical history on file.  No past surgical history on file.   (Not in a hospital admission) No Known Allergies  Social History  Substance Use Topics  . Smoking status: Never Smoker   . Smokeless tobacco: Not on file  . Alcohol Use: Not on file    No family history on file.   Review of Systems  Constitutional: Negative.   HENT: Negative.   Eyes: Negative.   Respiratory: Negative.   Cardiovascular: Negative.   Gastrointestinal: Negative.   Genitourinary: Negative.   Musculoskeletal: Positive for joint pain.  Skin: Negative.   Neurological: Negative.   Endo/Heme/Allergies: Negative.   Psychiatric/Behavioral: Negative.     Objective:  Physical Exam  Constitutional: He is oriented to person, place, and time. He appears well-developed and  well-nourished.  HENT:  Head: Normocephalic and atraumatic.  Eyes: EOM are normal. Pupils are equal, round, and reactive to light.  Neck: Normal range of motion. Neck supple.  Cardiovascular: Normal rate, regular rhythm and normal heart sounds.   Respiratory: Effort normal and breath sounds normal.  GI: Soft. Bowel sounds are normal.  Musculoskeletal:  Marked decrease in internal rotation.    Neurological: He is alert and oriented to person, place, and time.  Skin: Skin is warm and dry.  Psychiatric: He has a normal mood and affect. His behavior is normal. Judgment and thought content normal.    Vital signs in last 24 hours: @VSRANGES @  Labs:   Estimated body mass index is 35.44 kg/(m^2) as calculated from the following:   Height as of 04/05/12: 5\' 11"  (1.803 m).   Weight as of 04/05/12: 115.214 kg (254 lb).   Imaging Review Plain radiographs demonstrate severe degenerative joint disease of the right hip(s). The bone quality appears to be fair for age and reported activity level.  Assessment/Plan:  End stage arthritis, right hip(s)  The patient history, physical examination, clinical judgement of the provider and imaging studies are consistent with end stage degenerative joint disease of the right hip(s) and total hip arthroplasty is deemed medically necessary. The treatment options including medical management, injection therapy, arthroscopy and arthroplasty were discussed at length. The risks and benefits of total hip arthroplasty were presented and reviewed. The risks due to aseptic loosening, infection, stiffness, dislocation/subluxation,  thromboembolic complications and other imponderables were discussed.  The patient acknowledged the explanation, agreed to proceed with the plan and consent was signed.  Patient is being admitted for inpatient treatment for surgery, pain control, PT, OT, prophylactic antibiotics, VTE prophylaxis, progressive ambulation and ADL's and discharge  planning.The patient is planning to be discharged to skilled nursing facility

## 2015-10-07 NOTE — Pre-Procedure Instructions (Signed)
KOLTEN RYBACK  10/07/2015     Your procedure is scheduled on November 9.  Report to [facility] Admitting at 6:30 A.M.  Call this number if you have problems the morning of surgery:  [phone]   Remember:  Do not eat food or drink liquids after midnight.  Take these medicines the morning of surgery with A SIP OF WATER HYDROcodone-acetaminophen (NORCO) if needed, LORazepam (ATIVAN) if needed, pantoprazole (PROTONIX), tamsulosin (FLOMAX),     STOP Vitamin D, Folic Acid, Thiamine, Vitamin B12 November 2   STOP/ Do not take Aspirin, Aleve, Naproxen, Advil, Ibuprofen, Motrin, Vitamins, Herbs, or Supplements starting November 2    Do not wear jewelry, make-up or nail polish.  Do not wear lotions, powders, or perfumes.  You may wear deodorant.  Do not shave 48 hours prior to surgery.  Men may shave face and neck.  Do not bring valuables to the hospital.  [facility] is not responsible for any belongings or valuables.  Contacts, dentures or bridgework may not be worn into surgery.  Leave your suitcase in the car.  After surgery it may be brought to your room.  For patients admitted to the hospital, discharge time will be determined by your treatment team.  Patients discharged the day of surgery will not be allowed to drive home.   [place] - Preparing for Surgery  Before surgery, you can play an important role.  Because skin is not sterile, your skin needs to be as free of germs as possible.  You can reduce the number of germs on you skin by washing with CHG (chlorahexidine gluconate) soap before surgery.  CHG is an antiseptic cleaner which kills germs and bonds with the skin to continue killing germs even after washing.  Please DO NOT use if you have an allergy to CHG or antibacterial soaps.  If your skin becomes reddened/irritated stop using the CHG and inform your nurse when you arrive at Short Stay.  Do not shave (including legs and underarms) for at least 48  hours prior to the first CHG shower.  You may shave your face.  Please follow these instructions carefully:   1.  Shower with CHG Soap the night before surgery and the morning of Surgery.  2.  If you choose to wash your hair, wash your hair first as usual with your normal shampoo.  3.  After you shampoo, rinse your hair and body thoroughly to remove the shampoo.  4.  Use CHG as you would any other liquid soap.  You can apply CHG directly to the skin and wash gently with scrungie or a clean washcloth.  5.  Apply the CHG Soap to your body ONLY FROM THE NECK DOWN.  Do not use on open wounds or open sores.  Avoid contact with your eyes, ears, mouth and genitals (private parts).  Wash genitals (private parts) with your normal soap.  6.  Wash thoroughly, paying special attention to the area where your surgery will be performed.  7.  Thoroughly rinse your body with warm water from the neck down.  8.  DO NOT shower/wash with your normal soap after using and rinsing off the CHG Soap.  9.  Pat yourself dry with a clean towel.            10.  Wear clean pajamas.            11.  Place clean sheets on your bed the night of your  first shower and do not sleep with pets.  Day of Surgery  Do not apply any lotions the morning of surgery.  Please wear clean clothes to the hospital/surgery center.  Please read over the following fact sheets that you were given. Pain Booklet, Coughing and Deep Breathing, Blood Transfusion Information, Total Joint Packet and Surgical Site Infection Prevention

## 2015-10-09 ENCOUNTER — Encounter (HOSPITAL_COMMUNITY)
Admission: RE | Admit: 2015-10-09 | Discharge: 2015-10-09 | Disposition: A | Discharge status: Home / Self Care | Payer: Commercial Managed Care - HMO | Source: Ambulatory Visit | Attending: Orthopedic Surgery | Admitting: Orthopedic Surgery

## 2015-10-09 ENCOUNTER — Encounter (HOSPITAL_COMMUNITY): Payer: Self-pay

## 2015-10-09 DIAGNOSIS — R9431 Abnormal electrocardiogram [ECG] [EKG]: Secondary | ICD-10-CM | POA: Diagnosis not present

## 2015-10-09 DIAGNOSIS — Z01818 Encounter for other preprocedural examination: Secondary | ICD-10-CM | POA: Insufficient documentation

## 2015-10-09 DIAGNOSIS — M1611 Unilateral primary osteoarthritis, right hip: Secondary | ICD-10-CM | POA: Insufficient documentation

## 2015-10-09 DIAGNOSIS — Z79899 Other long term (current) drug therapy: Secondary | ICD-10-CM | POA: Diagnosis not present

## 2015-10-09 DIAGNOSIS — K219 Gastro-esophageal reflux disease without esophagitis: Secondary | ICD-10-CM | POA: Insufficient documentation

## 2015-10-09 DIAGNOSIS — Z0183 Encounter for blood typing: Secondary | ICD-10-CM | POA: Diagnosis not present

## 2015-10-09 DIAGNOSIS — Z01812 Encounter for preprocedural laboratory examination: Secondary | ICD-10-CM | POA: Insufficient documentation

## 2015-10-09 HISTORY — DX: Anxiety disorder, unspecified: F41.9

## 2015-10-09 HISTORY — DX: Gastro-esophageal reflux disease without esophagitis: K21.9

## 2015-10-09 HISTORY — DX: Major depressive disorder, single episode, unspecified: F32.9

## 2015-10-09 HISTORY — DX: Hesitancy of micturition: R39.11

## 2015-10-09 HISTORY — DX: Malignant (primary) neoplasm, unspecified: C80.1

## 2015-10-09 HISTORY — DX: Depression, unspecified: F32.A

## 2015-10-09 HISTORY — DX: Unspecified osteoarthritis, unspecified site: M19.90

## 2015-10-09 LAB — COMPREHENSIVE METABOLIC PANEL
ALBUMIN: 2.8 g/dL — AB (ref 3.5–5.0)
ALT: 11 U/L — AB (ref 17–63)
AST: 34 U/L (ref 15–41)
Alkaline Phosphatase: 101 U/L (ref 38–126)
Anion gap: 11 (ref 5–15)
BUN: 7 mg/dL (ref 6–20)
CHLORIDE: 97 mmol/L — AB (ref 101–111)
CO2: 29 mmol/L (ref 22–32)
CREATININE: 0.83 mg/dL (ref 0.61–1.24)
Calcium: 9.3 mg/dL (ref 8.9–10.3)
GFR calc Af Amer: >60 mL/min (ref >60)
GLUCOSE: 96 mg/dL (ref 65–99)
POTASSIUM: 4.6 mmol/L (ref 3.5–5.1)
SODIUM: 137 mmol/L (ref 135–145)
Total Bilirubin: 0.7 mg/dL (ref 0.3–1.2)
Total Protein: 7 g/dL (ref 6.5–8.1)

## 2015-10-09 LAB — ABO/RH: ABO/RH(D): A POS

## 2015-10-09 LAB — CBC WITH DIFFERENTIAL/PLATELET
BASOS ABS: 0 10*3/uL (ref 0.0–0.1)
BASOS PCT: 0 %
EOS PCT: 1 %
Eosinophils Absolute: 0.1 10*3/uL (ref 0.0–0.7)
HCT: 31.7 % — ABNORMAL LOW (ref 39.0–52.0)
Hemoglobin: 10.1 g/dL — ABNORMAL LOW (ref 13.0–17.0)
LYMPHS PCT: 18 %
Lymphs Abs: 1.4 10*3/uL (ref 0.7–4.0)
MCH: 31.6 pg (ref 26.0–34.0)
MCHC: 31.9 g/dL (ref 30.0–36.0)
MCV: 99.1 fL (ref 78.0–100.0)
MONO ABS: 0.9 10*3/uL (ref 0.1–1.0)
Monocytes Relative: 12 %
Neutro Abs: 5.1 10*3/uL (ref 1.7–7.7)
Neutrophils Relative %: 69 %
PLATELETS: 311 10*3/uL (ref 150–400)
RBC: 3.2 MIL/uL — AB (ref 4.22–5.81)
RDW: 14.4 % (ref 11.5–15.5)
WBC: 7.4 10*3/uL (ref 4.0–10.5)

## 2015-10-09 LAB — APTT: APTT: 39 s — AB (ref 24–37)

## 2015-10-09 LAB — SURGICAL PCR SCREEN
MRSA, PCR: POSITIVE — AB
STAPHYLOCOCCUS AUREUS: POSITIVE — AB

## 2015-10-09 LAB — PROTIME-INR
INR: 1.15 (ref 0.00–1.49)
PROTHROMBIN TIME: 14.8 s (ref 11.6–15.2)

## 2015-10-10 ENCOUNTER — Encounter (HOSPITAL_COMMUNITY): Payer: Self-pay

## 2015-10-10 LAB — URINE CULTURE: CULTURE: NO GROWTH

## 2015-10-10 NOTE — Progress Notes (Signed)
Anesthesia Chart Review:  Pt is 69 year old male scheduled for R total hip arthroplasty on 10/18/2015 with Dr. Maryla Morrow.   PMH includes: GERD. Never smoker. BMI 31.5.   Medications include: lasix, protonix, potassium.   Preoperative labs reviewed.  PTT 39. H/H 10.1/31.7  EKG 10/09/15: NSR. Possible Inferior infarct, age undetermined. No significant change since 05/11/11 per Dr. Kennon Holter interpretation.   If no changes, I anticipate pt can proceed with surgery as scheduled.   Willeen Cass, FNP-BC [facility] Short Stay Surgical Center/Anesthesiology Phone: [phone] 10/10/2015 12:33 PM

## 2015-10-16 ENCOUNTER — Other Ambulatory Visit: Payer: Self-pay | Admitting: Physician Assistant

## 2015-10-17 ENCOUNTER — Encounter (HOSPITAL_COMMUNITY): Payer: Self-pay | Admitting: Certified Registered Nurse Anesthetist

## 2015-10-17 DIAGNOSIS — K219 Gastro-esophageal reflux disease without esophagitis: Secondary | ICD-10-CM | POA: Insufficient documentation

## 2015-10-17 DIAGNOSIS — F32A Depression, unspecified: Secondary | ICD-10-CM | POA: Insufficient documentation

## 2015-10-17 DIAGNOSIS — F419 Anxiety disorder, unspecified: Secondary | ICD-10-CM | POA: Insufficient documentation

## 2015-10-17 DIAGNOSIS — M1990 Unspecified osteoarthritis, unspecified site: Secondary | ICD-10-CM | POA: Diagnosis present

## 2015-10-17 DIAGNOSIS — F329 Major depressive disorder, single episode, unspecified: Secondary | ICD-10-CM | POA: Insufficient documentation

## 2015-10-17 DIAGNOSIS — C439 Malignant melanoma of skin, unspecified: Secondary | ICD-10-CM | POA: Insufficient documentation

## 2015-10-17 MED ORDER — CHLORHEXIDINE GLUCONATE 4 % EX LIQD: 60.0000 mL | Freq: Once | CUTANEOUS | Status: DC

## 2015-10-17 MED ORDER — TRANEXAMIC ACID 1000 MG/10ML IV SOLN
2000.0000 mg | INTRAVENOUS | Status: DC
  Filled 2015-10-17: qty 20

## 2015-10-17 MED ORDER — VANCOMYCIN HCL 10 G IV SOLR
1500.0000 mg | INTRAVENOUS | Status: AC
  Administered 2015-10-18: 1500 mg via INTRAVENOUS
  Filled 2015-10-17: qty 1500

## 2015-10-17 MED ORDER — LACTATED RINGERS IV SOLN
INTRAVENOUS | Status: DC
  Administered 2015-10-18 (×2): via INTRAVENOUS

## 2015-10-17 NOTE — Anesthesia Preprocedure Evaluation (Addendum)
Anesthesia Evaluation  Patient identified by MRN, date of birth, ID band Patient awake  General Assessment Comment: [name] is a pleasant 69 y/o male with history significant for GERD, anxiety/depression, and a BMI of 31.6 who is scheduled for a right total hip arthroplasty (anterior approach) by Dr. Maryla Morrow d/t ongoing DJD.   Reviewed: Allergy & Precautions, NPO status , Patient's Chart, lab work & pertinent test results  History of Anesthesia Complications Negative for: history of anesthetic complications  Airway Mallampati: I  TM Distance: >3 FB Neck ROM: Full    Dental  (+) Missing, Poor Dentition, Dental Advisory Given   Pulmonary neg pulmonary ROS,    breath sounds clear to auscultation       Cardiovascular hypertension, Pt. on medications  Rhythm:Regular Rate:Normal     Neuro/Psych PSYCHIATRIC DISORDERS Anxiety Depression negative neurological ROS     GI/Hepatic Neg liver ROS, GERD  Medicated and Controlled,  Endo/Other  negative endocrine ROS  Renal/GU negative Renal ROS Bladder dysfunction  Hesitancy - taking flomax negative genitourinary   Musculoskeletal  (+) Arthritis , Osteoarthritis,    Abdominal (+) + obese,  Abdomen: soft.    Peds negative pediatric ROS (+)  Hematology negative hematology ROS (+)   Anesthesia Other Findings   Reproductive/Obstetrics negative OB ROS                          EKG 10/09/15: NSR. Possible Inferior infarct, age undetermined. No significant change since 05/11/11 per Dr. Kennon Holter interpretation.   Lab Results  Component Value Date   WBC 7.4 10/09/2015   HGB 10.1* 10/09/2015   HCT 31.7* 10/09/2015   MCV 99.1 10/09/2015   PLT 311 10/09/2015     Chemistry      Component Value Date/Time   NA 137 10/09/2015 1159   K 4.6 10/09/2015 1159   CL 97* 10/09/2015 1159   CO2 29 10/09/2015 1159   BUN 7 10/09/2015 1159   CREATININE 0.83  10/09/2015 1159      Component Value Date/Time   CALCIUM 9.3 10/09/2015 1159   ALKPHOS 101 10/09/2015 1159   AST 34 10/09/2015 1159   ALT 11* 10/09/2015 1159   BILITOT 0.7 10/09/2015 1159       Anesthesia Physical Anesthesia Plan  ASA: II  Anesthesia Plan: General   Post-op Pain Management:    Induction: Intravenous  Airway Management Planned: Oral ETT  Additional Equipment:   Intra-op Plan:   Post-operative Plan: Extubation in OR  Informed Consent: I have reviewed the patients History and Physical, chart, labs and discussed the procedure including the risks, benefits and alternatives for the proposed anesthesia with the patient or authorized representative who has indicated his/her understanding and acceptance.   Dental advisory given  Plan Discussed with: CRNA  Anesthesia Plan Comments:         Anesthesia Quick Evaluation

## 2015-10-18 ENCOUNTER — Encounter (HOSPITAL_COMMUNITY): Payer: Self-pay | Admitting: *Deleted

## 2015-10-18 ENCOUNTER — Inpatient Hospital Stay (HOSPITAL_COMMUNITY): Payer: Commercial Managed Care - HMO | Admitting: Emergency Medicine

## 2015-10-18 ENCOUNTER — Inpatient Hospital Stay (HOSPITAL_COMMUNITY)
Admission: AD | Admit: 2015-10-18 | Discharge: 2015-10-20 | DRG: 470 | Disposition: A | Discharge status: Skilled Nursing Facility | Payer: Commercial Managed Care - HMO | Source: Ambulatory Visit | Attending: Orthopedic Surgery | Admitting: Orthopedic Surgery

## 2015-10-18 ENCOUNTER — Encounter (HOSPITAL_COMMUNITY)
Admission: AD | Disposition: A | Discharge status: Skilled Nursing Facility | Payer: Self-pay | Source: Ambulatory Visit | Attending: Orthopedic Surgery

## 2015-10-18 ENCOUNTER — Inpatient Hospital Stay (HOSPITAL_COMMUNITY): Payer: Commercial Managed Care - HMO | Admitting: Certified Registered Nurse Anesthetist

## 2015-10-18 ENCOUNTER — Inpatient Hospital Stay (HOSPITAL_COMMUNITY): Payer: Commercial Managed Care - HMO

## 2015-10-18 DIAGNOSIS — F419 Anxiety disorder, unspecified: Secondary | ICD-10-CM | POA: Diagnosis present

## 2015-10-18 DIAGNOSIS — K219 Gastro-esophageal reflux disease without esophagitis: Secondary | ICD-10-CM | POA: Diagnosis present

## 2015-10-18 DIAGNOSIS — F329 Major depressive disorder, single episode, unspecified: Secondary | ICD-10-CM | POA: Diagnosis present

## 2015-10-18 DIAGNOSIS — I10 Essential (primary) hypertension: Secondary | ICD-10-CM | POA: Diagnosis present

## 2015-10-18 DIAGNOSIS — D62 Acute posthemorrhagic anemia: Secondary | ICD-10-CM | POA: Diagnosis not present

## 2015-10-18 DIAGNOSIS — M1611 Unilateral primary osteoarthritis, right hip: Principal | ICD-10-CM | POA: Diagnosis present

## 2015-10-18 DIAGNOSIS — M1990 Unspecified osteoarthritis, unspecified site: Secondary | ICD-10-CM | POA: Diagnosis present

## 2015-10-18 DIAGNOSIS — Z96649 Presence of unspecified artificial hip joint: Secondary | ICD-10-CM

## 2015-10-18 DIAGNOSIS — M25551 Pain in right hip: Secondary | ICD-10-CM | POA: Diagnosis present

## 2015-10-18 DIAGNOSIS — M109 Gout, unspecified: Secondary | ICD-10-CM | POA: Diagnosis present

## 2015-10-18 DIAGNOSIS — M1610 Unilateral primary osteoarthritis, unspecified hip: Secondary | ICD-10-CM | POA: Diagnosis present

## 2015-10-18 DIAGNOSIS — Z85828 Personal history of other malignant neoplasm of skin: Secondary | ICD-10-CM | POA: Diagnosis not present

## 2015-10-18 HISTORY — DX: Personal history of other diseases of the musculoskeletal system and connective tissue: Z87.39

## 2015-10-18 HISTORY — PX: TOTAL HIP ARTHROPLASTY: SHX124

## 2015-10-18 LAB — POCT I-STAT 4, (NA,K, GLUC, HGB,HCT)
Glucose, Bld: 127 mg/dL — ABNORMAL HIGH (ref 65–99)
HCT: 30 % — ABNORMAL LOW (ref 39.0–52.0)
Hemoglobin: 10.2 g/dL — ABNORMAL LOW (ref 13.0–17.0)
Potassium: 4.4 mmol/L (ref 3.5–5.1)
Sodium: 133 mmol/L — ABNORMAL LOW (ref 135–145)

## 2015-10-18 LAB — CBC
HCT: 26.7 % — ABNORMAL LOW (ref 39.0–52.0)
HEMOGLOBIN: 8.3 g/dL — AB (ref 13.0–17.0)
MCH: 30.1 pg (ref 26.0–34.0)
MCHC: 31.1 g/dL (ref 30.0–36.0)
MCV: 96.7 fL (ref 78.0–100.0)
PLATELETS: 303 10*3/uL (ref 150–400)
RBC: 2.76 MIL/uL — AB (ref 4.22–5.81)
RDW: 14.1 % (ref 11.5–15.5)
WBC: 12.7 10*3/uL — AB (ref 4.0–10.5)

## 2015-10-18 LAB — CREATININE, SERUM
CREATININE: 0.85 mg/dL (ref 0.61–1.24)
GFR calc non Af Amer: >60 mL/min (ref >60)

## 2015-10-18 SURGERY — ARTHROPLASTY, HIP, TOTAL, ANTERIOR APPROACH
Anesthesia: General | Laterality: Right

## 2015-10-18 MED ORDER — ONDANSETRON HCL 4 MG PO TABS: 4.0000 mg | ORAL_TABLET | Freq: Four times a day (QID) | ORAL | Status: DC | PRN

## 2015-10-18 MED ORDER — MENTHOL 3 MG MT LOZG: 1.0000 | LOZENGE | OROMUCOSAL | Status: DC | PRN

## 2015-10-18 MED ORDER — STERILE WATER FOR INJECTION IJ SOLN
INTRAMUSCULAR | Status: AC
  Filled 2015-10-18: qty 10

## 2015-10-18 MED ORDER — LIDOCAINE HCL (CARDIAC) 20 MG/ML IV SOLN
INTRAVENOUS | Status: DC | PRN
  Administered 2015-10-18: 80 mg via INTRAVENOUS

## 2015-10-18 MED ORDER — POTASSIUM CHLORIDE IN NACL 20-0.9 MEQ/L-% IV SOLN
INTRAVENOUS | Status: DC
  Administered 2015-10-18 – 2015-10-19 (×2): via INTRAVENOUS
  Filled 2015-10-18 (×2): qty 1000

## 2015-10-18 MED ORDER — GLYCOPYRROLATE 0.2 MG/ML IJ SOLN
INTRAMUSCULAR | Status: AC
  Filled 2015-10-18: qty 1

## 2015-10-18 MED ORDER — ONDANSETRON HCL 4 MG/2ML IJ SOLN
INTRAMUSCULAR | Status: AC
  Filled 2015-10-18: qty 2

## 2015-10-18 MED ORDER — ALBUMIN HUMAN 5 % IV SOLN
INTRAVENOUS | Status: DC | PRN
  Administered 2015-10-18: 11:00:00 via INTRAVENOUS

## 2015-10-18 MED ORDER — ONDANSETRON HCL 4 MG PO TABS: 4.0000 mg | ORAL_TABLET | Freq: Three times a day (TID) | ORAL | Status: DC | PRN

## 2015-10-18 MED ORDER — PHENYLEPHRINE HCL 10 MG/ML IJ SOLN
INTRAMUSCULAR | Status: DC | PRN
  Administered 2015-10-18: 80 ug via INTRAVENOUS
  Administered 2015-10-18: 120 ug via INTRAVENOUS
  Administered 2015-10-18: 200 ug via INTRAVENOUS

## 2015-10-18 MED ORDER — MIDAZOLAM HCL 2 MG/2ML IJ SOLN
INTRAMUSCULAR | Status: AC
  Filled 2015-10-18: qty 4

## 2015-10-18 MED ORDER — SENNOSIDES-DOCUSATE SODIUM 8.6-50 MG PO TABS: 1.0000 | ORAL_TABLET | Freq: Every evening | ORAL | Status: DC | PRN

## 2015-10-18 MED ORDER — SUCCINYLCHOLINE CHLORIDE 20 MG/ML IJ SOLN
INTRAMUSCULAR | Status: AC
  Filled 2015-10-18: qty 1

## 2015-10-18 MED ORDER — TRANEXAMIC ACID 1000 MG/10ML IV SOLN
2000.0000 mg | INTRAVENOUS | Status: DC | PRN
  Administered 2015-10-18: 2000 mg via INTRAVENOUS

## 2015-10-18 MED ORDER — FENTANYL CITRATE (PF) 250 MCG/5ML IJ SOLN
INTRAMUSCULAR | Status: AC
  Filled 2015-10-18: qty 5

## 2015-10-18 MED ORDER — ROCURONIUM BROMIDE 100 MG/10ML IV SOLN
INTRAVENOUS | Status: DC | PRN
  Administered 2015-10-18: 5 mg via INTRAVENOUS
  Administered 2015-10-18: 45 mg via INTRAVENOUS

## 2015-10-18 MED ORDER — OXYCODONE HCL 5 MG PO TABS
5.0000 mg | ORAL_TABLET | ORAL | Status: DC | PRN
  Administered 2015-10-18 – 2015-10-20 (×12): 10 mg via ORAL
  Filled 2015-10-18 (×11): qty 2

## 2015-10-18 MED ORDER — METHOCARBAMOL 500 MG PO TABS
ORAL_TABLET | ORAL | Status: AC
  Filled 2015-10-18: qty 1

## 2015-10-18 MED ORDER — MIDAZOLAM HCL 5 MG/5ML IJ SOLN
INTRAMUSCULAR | Status: DC | PRN
  Administered 2015-10-18 (×2): 1 mg via INTRAVENOUS

## 2015-10-18 MED ORDER — HYDROMORPHONE HCL 1 MG/ML IJ SOLN
0.5000 mg | INTRAMUSCULAR | Status: DC | PRN
  Administered 2015-10-18 (×2): 1 mg via INTRAVENOUS
  Filled 2015-10-18 (×2): qty 1

## 2015-10-18 MED ORDER — METHOCARBAMOL 500 MG PO TABS
500.0000 mg | ORAL_TABLET | Freq: Four times a day (QID) | ORAL | Status: DC | PRN
  Administered 2015-10-18 – 2015-10-20 (×2): 500 mg via ORAL
  Filled 2015-10-18: qty 1

## 2015-10-18 MED ORDER — ENOXAPARIN SODIUM 40 MG/0.4ML ~~LOC~~ SOLN
40.0000 mg | SUBCUTANEOUS | Status: DC
  Administered 2015-10-19 – 2015-10-20 (×2): 40 mg via SUBCUTANEOUS
  Filled 2015-10-18 (×2): qty 0.4

## 2015-10-18 MED ORDER — TAMSULOSIN HCL 0.4 MG PO CAPS
0.4000 mg | ORAL_CAPSULE | Freq: Every day | ORAL | Status: DC
  Administered 2015-10-18 – 2015-10-20 (×3): 0.4 mg via ORAL
  Filled 2015-10-18 (×3): qty 1

## 2015-10-18 MED ORDER — PHENYLEPHRINE 40 MCG/ML (10ML) SYRINGE FOR IV PUSH (FOR BLOOD PRESSURE SUPPORT)
PREFILLED_SYRINGE | INTRAVENOUS | Status: AC
  Filled 2015-10-18: qty 10

## 2015-10-18 MED ORDER — PHENYLEPHRINE HCL 10 MG/ML IJ SOLN
10.0000 mg | INTRAVENOUS | Status: DC | PRN
  Administered 2015-10-18: 20 ug/min via INTRAVENOUS

## 2015-10-18 MED ORDER — PROPOFOL 10 MG/ML IV BOLUS
INTRAVENOUS | Status: AC
  Filled 2015-10-18: qty 20

## 2015-10-18 MED ORDER — BUPIVACAINE LIPOSOME 1.3 % IJ SUSP
20.0000 mL | INTRAMUSCULAR | Status: DC
  Filled 2015-10-18: qty 20

## 2015-10-18 MED ORDER — OXYCODONE HCL 5 MG PO TABS
ORAL_TABLET | ORAL | Status: AC
  Filled 2015-10-18: qty 2

## 2015-10-18 MED ORDER — ZOLPIDEM TARTRATE 5 MG PO TABS: 5.0000 mg | ORAL_TABLET | Freq: Every evening | ORAL | Status: DC | PRN

## 2015-10-18 MED ORDER — LORAZEPAM 1 MG PO TABS
1.0000 mg | ORAL_TABLET | Freq: Every day | ORAL | Status: DC | PRN
Start: 2015-10-18 — End: 2015-10-20

## 2015-10-18 MED ORDER — FENTANYL CITRATE (PF) 100 MCG/2ML IJ SOLN
INTRAMUSCULAR | Status: AC
  Administered 2015-10-18: 50 ug via INTRAVENOUS
  Filled 2015-10-18: qty 2

## 2015-10-18 MED ORDER — PHENOL 1.4 % MT LIQD: 1.0000 | OROMUCOSAL | Status: DC | PRN

## 2015-10-18 MED ORDER — BUPIVACAINE LIPOSOME 1.3 % IJ SUSP
INTRAMUSCULAR | Status: DC | PRN
  Administered 2015-10-18: 20 mL

## 2015-10-18 MED ORDER — VITAMIN B-1 100 MG PO TABS
100.0000 mg | ORAL_TABLET | Freq: Every day | ORAL | Status: DC
  Administered 2015-10-19 – 2015-10-20 (×2): 100 mg via ORAL
  Filled 2015-10-18 (×3): qty 1

## 2015-10-18 MED ORDER — METOCLOPRAMIDE HCL 5 MG PO TABS: 5.0000 mg | ORAL_TABLET | Freq: Three times a day (TID) | ORAL | Status: DC | PRN

## 2015-10-18 MED ORDER — ROCURONIUM BROMIDE 50 MG/5ML IV SOLN
INTRAVENOUS | Status: AC
  Filled 2015-10-18: qty 1

## 2015-10-18 MED ORDER — LIDOCAINE HCL (CARDIAC) 20 MG/ML IV SOLN
INTRAVENOUS | Status: AC
  Filled 2015-10-18: qty 5

## 2015-10-18 MED ORDER — 0.9 % SODIUM CHLORIDE (POUR BTL) OPTIME
TOPICAL | Status: DC | PRN
  Administered 2015-10-18: 1000 mL

## 2015-10-18 MED ORDER — BISACODYL 5 MG PO TBEC: 5.0000 mg | DELAYED_RELEASE_TABLET | Freq: Every day | ORAL | Status: DC | PRN

## 2015-10-18 MED ORDER — ONDANSETRON HCL 4 MG/2ML IJ SOLN
INTRAMUSCULAR | Status: DC | PRN
  Administered 2015-10-18: 4 mg via INTRAVENOUS

## 2015-10-18 MED ORDER — FENTANYL CITRATE (PF) 100 MCG/2ML IJ SOLN
25.0000 ug | INTRAMUSCULAR | Status: DC | PRN
  Administered 2015-10-18 (×3): 50 ug via INTRAVENOUS

## 2015-10-18 MED ORDER — BISACODYL 10 MG RE SUPP: 10.0000 mg | Freq: Every day | RECTAL | Status: DC | PRN

## 2015-10-18 MED ORDER — FENTANYL CITRATE (PF) 100 MCG/2ML IJ SOLN
INTRAMUSCULAR | Status: DC | PRN
  Administered 2015-10-18 (×10): 50 ug via INTRAVENOUS

## 2015-10-18 MED ORDER — METOCLOPRAMIDE HCL 5 MG/ML IJ SOLN: 5.0000 mg | Freq: Three times a day (TID) | INTRAMUSCULAR | Status: DC | PRN

## 2015-10-18 MED ORDER — ONDANSETRON HCL 4 MG/2ML IJ SOLN: 4.0000 mg | Freq: Once | INTRAMUSCULAR | Status: DC | PRN

## 2015-10-18 MED ORDER — ENOXAPARIN SODIUM 40 MG/0.4ML ~~LOC~~ SOLN: 40.0000 mg | SUBCUTANEOUS | Status: DC

## 2015-10-18 MED ORDER — VANCOMYCIN HCL IN DEXTROSE 1-5 GM/200ML-% IV SOLN
1000.0000 mg | Freq: Two times a day (BID) | INTRAVENOUS | Status: AC
  Administered 2015-10-18: 1000 mg via INTRAVENOUS
  Filled 2015-10-18: qty 200

## 2015-10-18 MED ORDER — FENTANYL CITRATE (PF) 100 MCG/2ML IJ SOLN
INTRAMUSCULAR | Status: AC
  Filled 2015-10-18: qty 2

## 2015-10-18 MED ORDER — VECURONIUM BROMIDE 10 MG IV SOLR
INTRAVENOUS | Status: DC | PRN
  Administered 2015-10-18 (×2): 2 mg via INTRAVENOUS
  Administered 2015-10-18: 3 mg via INTRAVENOUS
  Administered 2015-10-18: 1 mg via INTRAVENOUS

## 2015-10-18 MED ORDER — FUROSEMIDE 40 MG PO TABS
40.0000 mg | ORAL_TABLET | Freq: Two times a day (BID) | ORAL | Status: DC
  Administered 2015-10-18 – 2015-10-20 (×4): 40 mg via ORAL
  Filled 2015-10-18 (×5): qty 1

## 2015-10-18 MED ORDER — FOLIC ACID 1 MG PO TABS
1.0000 mg | ORAL_TABLET | Freq: Every day | ORAL | Status: DC
  Administered 2015-10-19 – 2015-10-20 (×2): 1 mg via ORAL
  Filled 2015-10-18 (×3): qty 1

## 2015-10-18 MED ORDER — SUGAMMADEX SODIUM 200 MG/2ML IV SOLN
INTRAVENOUS | Status: AC
  Filled 2015-10-18: qty 2

## 2015-10-18 MED ORDER — DEXAMETHASONE SODIUM PHOSPHATE 10 MG/ML IJ SOLN
10.0000 mg | Freq: Once | INTRAMUSCULAR | Status: AC
  Administered 2015-10-19: 10 mg via INTRAVENOUS
  Filled 2015-10-18 (×2): qty 1

## 2015-10-18 MED ORDER — ONDANSETRON HCL 4 MG/2ML IJ SOLN: 4.0000 mg | Freq: Four times a day (QID) | INTRAMUSCULAR | Status: DC | PRN

## 2015-10-18 MED ORDER — METHOCARBAMOL 1000 MG/10ML IJ SOLN
500.0000 mg | Freq: Four times a day (QID) | INTRAVENOUS | Status: DC | PRN
  Filled 2015-10-18: qty 5

## 2015-10-18 MED ORDER — DIPHENHYDRAMINE HCL 12.5 MG/5ML PO ELIX: 12.5000 mg | ORAL_SOLUTION | ORAL | Status: DC | PRN

## 2015-10-18 MED ORDER — SUGAMMADEX SODIUM 200 MG/2ML IV SOLN
INTRAVENOUS | Status: DC | PRN
  Administered 2015-10-18: 200 mg via INTRAVENOUS

## 2015-10-18 MED ORDER — CELECOXIB 200 MG PO CAPS
200.0000 mg | ORAL_CAPSULE | Freq: Two times a day (BID) | ORAL | Status: DC
  Administered 2015-10-18 – 2015-10-20 (×5): 200 mg via ORAL
  Filled 2015-10-18 (×6): qty 1

## 2015-10-18 MED ORDER — PROPOFOL 10 MG/ML IV BOLUS
INTRAVENOUS | Status: DC | PRN
  Administered 2015-10-18: 120 mg via INTRAVENOUS

## 2015-10-18 MED ORDER — DOCUSATE SODIUM 100 MG PO CAPS
100.0000 mg | ORAL_CAPSULE | Freq: Two times a day (BID) | ORAL | Status: DC
  Administered 2015-10-18 – 2015-10-20 (×5): 100 mg via ORAL
  Filled 2015-10-18 (×5): qty 1

## 2015-10-18 MED ORDER — EPHEDRINE SULFATE 50 MG/ML IJ SOLN
INTRAMUSCULAR | Status: AC
  Filled 2015-10-18: qty 1

## 2015-10-18 MED ORDER — MAGNESIUM CITRATE PO SOLN: 1.0000 | Freq: Once | ORAL | Status: DC | PRN

## 2015-10-18 MED ORDER — PANTOPRAZOLE SODIUM 40 MG PO TBEC
40.0000 mg | DELAYED_RELEASE_TABLET | Freq: Every day | ORAL | Status: DC
  Administered 2015-10-18 – 2015-10-20 (×3): 40 mg via ORAL
  Filled 2015-10-18 (×3): qty 1

## 2015-10-18 MED ORDER — POTASSIUM CHLORIDE CRYS ER 20 MEQ PO TBCR
20.0000 meq | EXTENDED_RELEASE_TABLET | Freq: Every day | ORAL | Status: DC
  Administered 2015-10-19 – 2015-10-20 (×2): 20 meq via ORAL
  Filled 2015-10-18 (×3): qty 1

## 2015-10-18 MED ORDER — OXYCODONE HCL 5 MG PO TABS: ORAL_TABLET | ORAL | Status: DC

## 2015-10-18 MED ORDER — VITAMIN B-12 1000 MCG PO TABS
1000.0000 ug | ORAL_TABLET | Freq: Every day | ORAL | Status: DC
  Administered 2015-10-19 – 2015-10-20 (×2): 1000 ug via ORAL
  Filled 2015-10-18 (×3): qty 1

## 2015-10-18 SURGICAL SUPPLY — 55 items
APL SKNCLS STERI-STRIP NONHPOA (GAUZE/BANDAGES/DRESSINGS) ×1
BENZOIN TINCTURE PRP APPL 2/3 (GAUZE/BANDAGES/DRESSINGS) ×2 IMPLANT
BLADE SAW SGTL 18X1.27X75 (BLADE) ×2 IMPLANT
BLADE SAW SGTL 18X1.27X75MM (BLADE) ×1
BLADE SURG ROTATE 9660 (MISCELLANEOUS) IMPLANT
CAPT HIP TOTAL 2 ×2 IMPLANT
CLOSURE WOUND 1/2 X4 (GAUZE/BANDAGES/DRESSINGS) ×1
COVER SURGICAL LIGHT HANDLE (MISCELLANEOUS) ×3 IMPLANT
DRAPE IMP U-DRAPE 54X76 (DRAPES) ×9 IMPLANT
DRAPE INCISE IOBAN 66X45 STRL (DRAPES) ×3 IMPLANT
DRAPE ORTHO SPLIT 77X108 STRL (DRAPES) ×6
DRAPE PROXIMA HALF (DRAPES) ×3 IMPLANT
DRAPE SURG 17X23 STRL (DRAPES) ×3 IMPLANT
DRAPE SURG ORHT 6 SPLT 77X108 (DRAPES) ×2 IMPLANT
DRAPE U-SHAPE 47X51 STRL (DRAPES) ×3 IMPLANT
DRSG AQUACEL AG ADV 3.5X10 (GAUZE/BANDAGES/DRESSINGS) ×3 IMPLANT
DURAPREP 26ML APPLICATOR (WOUND CARE) ×3 IMPLANT
ELECT BLADE 4.0 EZ CLEAN MEGAD (MISCELLANEOUS)
ELECT CAUTERY BLADE 6.4 (BLADE) ×3 IMPLANT
ELECT REM PT RETURN 9FT ADLT (ELECTROSURGICAL) ×3
ELECTRODE BLDE 4.0 EZ CLN MEGD (MISCELLANEOUS) IMPLANT
ELECTRODE REM PT RTRN 9FT ADLT (ELECTROSURGICAL) ×1 IMPLANT
FACESHIELD WRAPAROUND (MASK) ×6 IMPLANT
FACESHIELD WRAPAROUND OR TEAM (MASK) ×2 IMPLANT
GLOVE BIOGEL PI IND STRL 7.0 (GLOVE) IMPLANT
GLOVE BIOGEL PI INDICATOR 7.0 (GLOVE) ×4
GLOVE ECLIPSE 7.0 STRL STRAW (GLOVE) ×6 IMPLANT
GLOVE ORTHO TXT STRL SZ7.5 (GLOVE) ×6 IMPLANT
GOWN STRL REUS W/ TWL LRG LVL3 (GOWN DISPOSABLE) ×3 IMPLANT
GOWN STRL REUS W/ TWL XL LVL3 (GOWN DISPOSABLE) ×1 IMPLANT
GOWN STRL REUS W/TWL LRG LVL3 (GOWN DISPOSABLE) ×9
GOWN STRL REUS W/TWL XL LVL3 (GOWN DISPOSABLE) ×3
KIT BASIN OR (CUSTOM PROCEDURE TRAY) ×3 IMPLANT
KIT ROOM TURNOVER OR (KITS) ×3 IMPLANT
MANIFOLD NEPTUNE II (INSTRUMENTS) ×3 IMPLANT
NDL SAFETY ECLIPSE 18X1.5 (NEEDLE) ×1 IMPLANT
NEEDLE HYPO 18GX1.5 SHARP (NEEDLE) ×3
NS IRRIG 1000ML POUR BTL (IV SOLUTION) ×3 IMPLANT
PACK TOTAL JOINT (CUSTOM PROCEDURE TRAY) ×3 IMPLANT
PACK UNIVERSAL I (CUSTOM PROCEDURE TRAY) ×3 IMPLANT
PAD ARMBOARD 7.5X6 YLW CONV (MISCELLANEOUS) ×6 IMPLANT
STRIP CLOSURE SKIN 1/2X4 (GAUZE/BANDAGES/DRESSINGS) ×1 IMPLANT
SUCTION FRAZIER TIP 10 FR DISP (SUCTIONS) ×3 IMPLANT
SUT MNCRL AB 4-0 PS2 18 (SUTURE) ×3 IMPLANT
SUT VIC AB 0 CT1 27 (SUTURE) ×3
SUT VIC AB 0 CT1 27XBRD ANBCTR (SUTURE) ×1 IMPLANT
SUT VIC AB 1 CT1 27 (SUTURE) ×9
SUT VIC AB 1 CT1 27XBRD ANBCTR (SUTURE) IMPLANT
SUT VIC AB 2-0 CT1 27 (SUTURE) ×6
SUT VIC AB 2-0 CT1 TAPERPNT 27 (SUTURE) ×2 IMPLANT
SYR 50ML LL SCALE MARK (SYRINGE) ×3 IMPLANT
TOWEL OR 17X24 6PK STRL BLUE (TOWEL DISPOSABLE) ×3 IMPLANT
TOWEL OR 17X26 10 PK STRL BLUE (TOWEL DISPOSABLE) ×3 IMPLANT
TRAY FOLEY CATH 14FR (SET/KITS/TRAYS/PACK) IMPLANT
WATER STERILE IRR 1000ML POUR (IV SOLUTION) ×1 IMPLANT

## 2015-10-18 NOTE — Progress Notes (Signed)
Pt arrived from PACU at 1245; Pt oriented to the unit room; IV intact and transfusing; right hip incision dsg slightly stained marked and no new drainage noted; pt dsg remains dry and intact; pt sitting up in chair with family at bedside and call light within reach. Reported off to oncoming RN. Delia Heady RN

## 2015-10-18 NOTE — Evaluation (Signed)
Physical Therapy Evaluation Patient Details Name: Jesus Daniel MRN: [id number] DOB: June 16, 1946 Today's Date: 10/18/2015   History of Present Illness  69 y.o. male s/p Rt. anterior THA  Clinical Impression  Pt is s/p Rt anterior THA resulting in the deficits listed below (see PT Problem List). Pt will benefit from skilled PT to increase their independence and safety with mobility to allow discharge to the venue listed below. Patient requiring +1 moderate assistance with bed mobility and transfers. Patient able to ambulate 5 feet with min assistance. Will continue to progress mobility as tolerated. Based upon the patient's current mobility, anticipate need for SNF at D/C.      Follow Up Recommendations SNF;Supervision for mobility/OOB    Equipment Recommendations  None recommended by PT;Other (comment) (reports having rw at home)    Recommendations for Other Services       Precautions / Restrictions Precautions Precautions: Anterior Hip;Fall Precaution Booklet Issued: Yes (comment) Precaution Comments: reviewed and posted Restrictions Weight Bearing Restrictions: Yes RLE Weight Bearing: Weight bearing as tolerated      Mobility  Bed Mobility Overal bed mobility: Needs Assistance Bed Mobility: Supine to Sit     Supine to sit: Mod assist;HOB elevated (with Rt LE and trunk)     General bed mobility comments: pivot turn to edge of bed, using rail to assist, cues for hip precautions  Transfers Overall transfer level: Needs assistance Equipment used: Rolling walker (2 wheeled) Transfers: Sit to/from Stand Sit to Stand: Mod assist;From elevated surface         General transfer comment: cues for hand placement, surface elevated.   Ambulation/Gait Ambulation/Gait assistance: Min assist Ambulation Distance (Feet): 5 Feet Assistive device: Rolling walker (2 wheeled) Gait Pattern/deviations: Step-to pattern;Decreased weight shift to right;Decreased stance time - right;Trunk  flexed Gait velocity: decreased   General Gait Details: slow step-to pattern, cues for weight bearing through Rt LE  Stairs            Wheelchair Mobility    Modified Rankin (Stroke Patients Only)       Balance Overall balance assessment: Needs assistance Sitting-balance support: Single extremity supported Sitting balance-Leahy Scale: Poor     Standing balance support: Bilateral upper extremity supported Standing balance-Leahy Scale: Poor Standing balance comment: using rw                             Pertinent Vitals/Pain Pain Assessment: 0-10 Pain Score: 6  Pain Location: Rt hip Pain Descriptors / Indicators:  (pain) Pain Intervention(s): Limited activity within patient's tolerance;Monitored during session    Home Living Family/patient expects to be discharged to:: Skilled nursing facility Living Arrangements: Alone Available Help at Discharge: Other (Comment) (uncertain) Type of Home: House Home Access: Stairs to enter Entrance Stairs-Rails: Right Entrance Stairs-Number of Steps: 5-6 Home Layout: One level Home Equipment: Walker - 2 wheels;Cane - single point Additional Comments: states he has slept in his lift chair for months    Prior Function Level of Independence: Independent with assistive device(s)         Comments: reports using SPC     Hand Dominance        Extremity/Trunk Assessment               Lower Extremity Assessment: RLE deficits/detail RLE Deficits / Details: min assist needed for moving LE with bed mobility       Communication   Communication: No difficulties  Cognition Arousal/Alertness: Awake/alert Behavior  During Therapy: WFL for tasks assessed/performed Overall Cognitive Status: Within Functional Limits for tasks assessed                      General Comments General comments (skin integrity, edema, etc.): Patient reports a history of multiple falls.     Exercises         Assessment/Plan    PT Assessment Patient needs continued PT services  PT Diagnosis Difficulty walking;Acute pain   PT Problem List Decreased strength;Decreased range of motion;Decreased activity tolerance;Decreased balance;Decreased mobility  PT Treatment Interventions DME instruction;Gait training;Stair training;Functional mobility training;Therapeutic activities;Therapeutic exercise;Balance training;Patient/family education   PT Goals (Current goals can be found in the Care Plan section) Acute Rehab PT Goals Patient Stated Goal: go for more rehab before going home PT Goal Formulation: With patient Time For Goal Achievement: 11/01/15 Potential to Achieve Goals: Good    Frequency 7X/week   Barriers to discharge Decreased caregiver support      Co-evaluation               End of Session Equipment Utilized During Treatment: Gait belt Activity Tolerance: Patient tolerated treatment well Patient left: in chair;with call bell/phone within reach Nurse Communication: Mobility status;Precautions;Weight bearing status         Time: 2706-2376 PT Time Calculation (min) (ACUTE ONLY): 29 min   Charges:   PT Evaluation $Initial PT Evaluation Tier I: 1 Procedure PT Treatments $Therapeutic Activity: 8-22 mins   PT G Codes:        Cassell Clement, PT, CSCS Pager [phone] Office [phone]  10/18/2015, 4:31 PM

## 2015-10-18 NOTE — Discharge Instructions (Signed)
INSTRUCTIONS AFTER JOINT REPLACEMENT   o Remove items at home which could result in a fall. This includes throw rugs or furniture in walking pathways o ICE to the affected joint every three hours while awake for 30 minutes at a time, for at least the first 3-5 days, and then as needed for pain and swelling.  Continue to use ice for pain and swelling. You may notice swelling that will progress down to the foot and ankle.  This is normal after surgery.  Elevate your leg when you are not up walking on it.   o Continue to use the breathing machine you got in the hospital (incentive spirometer) which will help keep your temperature down.  It is common for your temperature to cycle up and down following surgery, especially at night when you are not up moving around and exerting yourself.  The breathing machine keeps your lungs expanded and your temperature down.  BLOOD THINNER: USE LOVENOX INJECTIONS AS DIRECTED FOR A TOTAL OF 14 DAYS FOLLOWING SURGERY.  ONCE FINISHED WITH THIS, TAKE ASPIRIN 325 MG ONE TABLET ONCE DAILY FOR THE NEXT 14 DAYS.  THESE MEDICATIONS ARE USED TO PREVENT BLOOD CLOTS.  DIET:  As you were doing prior to hospitalization, we recommend a well-balanced diet.  DRESSING / WOUND CARE / SHOWERING  You may change your dressing 3-5 days after surgery.  Then change the dressing every day with sterile gauze.  Please use good hand washing techniques before changing the dressing.  Do not use any lotions or creams on the incision until instructed by your surgeon. and You may shower 3 days after surgery, but keep the wounds dry during showering.  You may use an occlusive plastic wrap (Press'n Seal for example), NO SOAKING/SUBMERGING IN THE BATHTUB.  If the bandage gets wet, change with a clean dry gauze.  If the incision gets wet, pat the wound dry with a clean towel.  ACTIVITY  o Increase activity slowly as tolerated, but follow the weight bearing instructions below.   o No driving for 6 weeks or  until further direction given by your physician.  You cannot drive while taking narcotics.  o No lifting or carrying greater than 10 lbs. until further directed by your surgeon. o Avoid periods of inactivity such as sitting longer than an hour when not asleep. This helps prevent blood clots.  o You may return to work once you are authorized by your doctor.     WEIGHT BEARING   Weight bearing as tolerated with assist device (walker, cane, etc) as directed, use it as long as suggested by your surgeon or therapist, typically at least 4-6 weeks.       CONSTIPATION  Constipation is defined medically as fewer than three stools per week and severe constipation as less than one stool per week.  Even if you have a regular bowel pattern at home, your normal regimen is likely to be disrupted due to multiple reasons following surgery.  Combination of anesthesia, postoperative narcotics, change in appetite and fluid intake all can affect your bowels.   YOU MUST use at least one of the following options; they are listed in order of increasing strength to get the job done.  They are all available over the counter, and you may need to use some, POSSIBLY even all of these options:    Drink plenty of fluids (prune juice may be helpful) and high fiber foods Colace 100 mg by mouth twice a day  Senokot for  constipation as directed and as needed Dulcolax (bisacodyl), take with full glass of water  Miralax (polyethylene glycol) once or twice a day as needed.  If you have tried all these things and are unable to have a bowel movement in the first 3-4 days after surgery call either your surgeon or your primary doctor.    If you experience loose stools or diarrhea, hold the medications until you stool forms back up.  If your symptoms do not get better within 1 week or if they get worse, check with your doctor.  If you experience "the worst abdominal pain ever" or develop nausea or vomiting, please contact the  office immediately for further recommendations for treatment.   ITCHING:  If you experience itching with your medications, try taking only a single pain pill, or even half a pain pill at a time.  You can also use Benadryl over the counter for itching or also to help with sleep.   TED HOSE STOCKINGS:  Use stockings on both legs until for at least 2 weeks or as directed by physician office. They may be removed at night for sleeping.  MEDICATIONS:  See your medication summary on the After Visit Summary that nursing will review with you.  You may have some home medications which will be placed on hold until you complete the course of blood thinner medication.  It is important for you to complete the blood thinner medication as prescribed.  PRECAUTIONS:  If you experience chest pain or shortness of breath - call 911 immediately for transfer to the hospital emergency department.   If you develop a fever greater that 101 F, purulent drainage from wound, increased redness or drainage from wound, foul odor from the wound/dressing, or calf pain - CONTACT YOUR SURGEON.                                                   FOLLOW-UP APPOINTMENTS:  If you do not already have a post-op appointment, please call the office for an appointment to be seen by your surgeon.  Guidelines for how soon to be seen are listed in your After Visit Summary, but are typically between 1-4 weeks after surgery.  OTHER INSTRUCTIONS:   Knee Replacement:  Do not place pillow under knee, focus on keeping the knee straight while resting. CPM instructions: 0-90 degrees, 2 hours in the morning, 2 hours in the afternoon, and 2 hours in the evening. Place foam block, curve side up under heel at all times except when in CPM or when walking.  DO NOT modify, tear, cut, or change the foam block in any way.  MAKE SURE YOU:   Understand these instructions.   Get help right away if you are not doing well or get worse.    Thank you for  letting us be a part of your medical care team.  It is a privilege we respect greatly.  We hope these instructions will help you stay on track for a fast and full recovery!

## 2015-10-18 NOTE — Progress Notes (Signed)
Utilization review completed.  

## 2015-10-18 NOTE — Interval H&P Note (Signed)
History and Physical Interval Note:  10/18/2015 8:28 AM  Jesus Daniel  has presented today for surgery, with the diagnosis of djd right hip  The various methods of treatment have been discussed with the patient and family. After consideration of risks, benefits and other options for treatment, the patient has consented to  Procedure(s): TOTAL HIP ARTHROPLASTY ANTERIOR APPROACH (Right) as a surgical intervention .  The patient's history has been reviewed, patient examined, no change in status, stable for surgery.  I have reviewed the patient's chart and labs.  Questions were answered to the patient's satisfaction.     Ninetta Lights

## 2015-10-18 NOTE — H&P (View-Only) (Signed)
TOTAL HIP ADMISSION H&P  Patient is admitted for right total hip arthroplasty.  Subjective:  Chief Complaint: right hip pain  HPI: Jesus Daniel, 69 y.o. male, has a history of pain and functional disability in the right hip(s) due to arthritis and patient has failed non-surgical conservative treatments for greater than 12 weeks to include NSAID's and/or analgesics and corticosteriod injections.  Onset of symptoms was gradual starting 2 years ago with rapidlly worsening course since that time.The patient noted no past surgery on the right hip(s).  Patient currently rates pain in the right hip at 10 out of 10 with activity. Patient has night pain, worsening of pain with activity and weight bearing, trendelenberg gait, pain that interfers with activities of daily living and pain with passive range of motion. Patient has evidence of subchondral cysts, subchondral sclerosis, periarticular osteophytes and joint space narrowing by imaging studies. This condition presents safety issues increasing the risk of falls.  There is no current active infection.  Patient Active Problem List   Diagnosis Date Noted  . HTN (hypertension) 04/05/2012   No past medical history on file.  No past surgical history on file.   (Not in a hospital admission) No Known Allergies  Social History  Substance Use Topics  . Smoking status: Never Smoker   . Smokeless tobacco: Not on file  . Alcohol Use: Not on file    No family history on file.   Review of Systems  Constitutional: Negative.   HENT: Negative.   Eyes: Negative.   Respiratory: Negative.   Cardiovascular: Negative.   Gastrointestinal: Negative.   Genitourinary: Negative.   Musculoskeletal: Positive for joint pain.  Skin: Negative.   Neurological: Negative.   Endo/Heme/Allergies: Negative.   Psychiatric/Behavioral: Negative.     Objective:  Physical Exam  Constitutional: He is oriented to person, place, and time. He appears well-developed and  well-nourished.  HENT:  Head: Normocephalic and atraumatic.  Eyes: EOM are normal. Pupils are equal, round, and reactive to light.  Neck: Normal range of motion. Neck supple.  Cardiovascular: Normal rate, regular rhythm and normal heart sounds.   Respiratory: Effort normal and breath sounds normal.  GI: Soft. Bowel sounds are normal.  Musculoskeletal:  Marked decrease in internal rotation.    Neurological: He is alert and oriented to person, place, and time.  Skin: Skin is warm and dry.  Psychiatric: He has a normal mood and affect. His behavior is normal. Judgment and thought content normal.    Vital signs in last 24 hours: @VSRANGES @  Labs:   Estimated body mass index is 35.44 kg/(m^2) as calculated from the following:   Height as of 04/05/12: 5\' 11"  (1.803 m).   Weight as of 04/05/12: 115.214 kg (254 lb).   Imaging Review Plain radiographs demonstrate severe degenerative joint disease of the right hip(s). The bone quality appears to be fair for age and reported activity level.  Assessment/Plan:  End stage arthritis, right hip(s)  The patient history, physical examination, clinical judgement of the provider and imaging studies are consistent with end stage degenerative joint disease of the right hip(s) and total hip arthroplasty is deemed medically necessary. The treatment options including medical management, injection therapy, arthroscopy and arthroplasty were discussed at length. The risks and benefits of total hip arthroplasty were presented and reviewed. The risks due to aseptic loosening, infection, stiffness, dislocation/subluxation,  thromboembolic complications and other imponderables were discussed.  The patient acknowledged the explanation, agreed to proceed with the plan and consent was signed.  Patient is being admitted for inpatient treatment for surgery, pain control, PT, OT, prophylactic antibiotics, VTE prophylaxis, progressive ambulation and ADL's and discharge  planning.The patient is planning to be discharged to skilled nursing facility

## 2015-10-18 NOTE — Transfer of Care (Signed)
Immediate Anesthesia Transfer of Care Note  Patient: Jesus Daniel  Procedure(s) Performed: Procedure(s): TOTAL HIP ARTHROPLASTY ANTERIOR APPROACH (Right)  Patient Location: PACU  Anesthesia Type:General  Level of Consciousness: awake, alert , oriented and patient cooperative  Airway & Oxygen Therapy: Patient Spontanous Breathing and Patient connected to face mask oxygen  Post-op Assessment: Report given to RN and Post -op Vital signs reviewed and stable  Post vital signs: Reviewed and stable  Last Vitals:  Filed Vitals:   10/18/15 0650  BP: 145/71  Pulse: 82  Temp: 36.6 C  Resp: 18    Complications: No apparent anesthesia complications

## 2015-10-18 NOTE — Anesthesia Procedure Notes (Signed)
Procedure Name: Intubation Date/Time: 10/18/2015 8:46 AM Performed by: Layla Maw Pre-anesthesia Checklist: Patient identified, Patient being monitored, Timeout performed, Emergency Drugs available and Suction available Patient Re-evaluated:Patient Re-evaluated prior to inductionOxygen Delivery Method: Circle System Utilized Preoxygenation: Pre-oxygenation with 100% oxygen Intubation Type: IV induction Ventilation: Mask ventilation without difficulty Laryngoscope Size: Miller and 3 Grade View: Grade I Tube type: Oral Tube size: 8.0 mm Number of attempts: 1 Airway Equipment and Method: Stylet Placement Confirmation: ETT inserted through vocal cords under direct vision,  positive ETCO2 and breath sounds checked- equal and bilateral Secured at: 23 cm Tube secured with: Tape Dental Injury: Teeth and Oropharynx as per pre-operative assessment

## 2015-10-18 NOTE — Discharge Summary (Addendum)
Patient ID: Jesus Daniel MRN: [id number] DOB/AGE: August 09, 1946 69 y.o.  Admit date: 10/18/2015 Discharge date: 10/20/2015  Admission Diagnoses:  Principal Problem:   DJD (degenerative joint disease) Active Problems:   Arthritis   Primary localized osteoarthrosis of pelvic region   Discharge Diagnoses:  Same  Past Medical History  Diagnosis Date  . GERD (gastroesophageal reflux disease)   . Depression   . Anxiety   . Cancer ([place])     skin  . Urinary hesitancy   . Arthritis     "probably in my knees" (10/18/2015)  . History of gout     Surgeries: Procedure(s): TOTAL HIP ARTHROPLASTY ANTERIOR APPROACH on 10/18/2015   Consultants:    Discharged Condition: Improved  Hospital Course: Jesus Daniel is an 69 y.o. male who was admitted 10/18/2015 for operative treatment ofDJD (degenerative joint disease) of the right knee. Patient has severe unremitting pain that affects sleep, daily activities, and work/hobbies. After pre-op clearance the patient was taken to the operating room on 10/18/2015 and underwent  Procedure(s): TOTAL HIP ARTHROPLASTY ANTERIOR APPROACH.  Patient with a  Pre-op Hb of 10.2 developed ABLA on pod#1 with a Hb of 7.0. We transfused with 2 units of prbc on pod#1, and on pod#2, Hb trended up to 9.3.  Patient is currently asymptomatic but we will continue to follow.  Patient was given perioperative antibiotics:      Anti-infectives    Start     Dose/Rate Route Frequency Ordered Stop   10/18/15 2000  vancomycin (VANCOCIN) IVPB 1000 mg/200 mL premix     1,000 mg 200 mL/hr over 60 Minutes Intravenous Every 12 hours 10/18/15 1248 10/18/15 2145   10/18/15 0700  vancomycin (VANCOCIN) 1,500 mg in sodium chloride 0.9 % 500 mL IVPB     1,500 mg 250 mL/hr over 120 Minutes Intravenous To [facility] 10/17/15 1117 10/18/15 0915       Patient was given sequential compression devices, early ambulation, and chemoprophylaxis to prevent DVT.  Patient benefited  maximally from hospital stay and there were no complications.    Recent vital signs:  Patient Vitals for the past 24 hrs:  BP Temp Temp src Pulse Resp SpO2  10/20/15 0504 112/63 mmHg 99.2 F (37.3 C) Oral 85 18 98 %  10/19/15 2037 131/68 mmHg 98.7 F (37.1 C) Oral (!) 101 16 99 %  10/19/15 1900 125/66 mmHg 97.8 F (36.6 C) Axillary (!) 107 16 98 %  10/19/15 1527 115/66 mmHg 98.4 F (36.9 C) Oral 96 16 98 %  10/19/15 1512 121/63 mmHg 98.5 F (36.9 C) Axillary 100 16 99 %  10/19/15 1413 112/62 mmHg 98.3 F (36.8 C) Oral 97 16 95 %  10/19/15 1024 107/60 mmHg 98.7 F (37.1 C) Axillary 96 16 100 %  10/19/15 1009 100/62 mmHg 98.7 F (37.1 C) Oral 98 14 100 %     Recent laboratory studies:   Recent Labs  10/19/15 0722 10/19/15 2035 10/20/15 0535  WBC 6.3 8.8 9.3  HGB 7.0* 9.0* 9.3*  HCT 21.6* 27.0* 28.5*  PLT 180 193 208  NA 130*  --  134*  K 3.7  --  3.5  CL 98*  --  97*  CO2 26  --  28  BUN 10  --  13  CREATININE 1.04  --  0.96  GLUCOSE 105*  --  109*  CALCIUM 8.1*  --  8.5*     Discharge Medications:     Medication List    STOP  taking these medications        HYDROcodone-acetaminophen 7.5-325 MG tablet  Commonly known as:  NORCO      TAKE these medications        bisacodyl 5 MG EC tablet  Commonly known as:  DULCOLAX  Take 1 tablet (5 mg total) by mouth daily as needed for moderate constipation.     cholecalciferol 1000 UNITS tablet  Commonly known as:  VITAMIN D  Take 1,000 Units by mouth daily.     enoxaparin 40 MG/0.4ML injection  Commonly known as:  LOVENOX  Inject 0.4 mLs (40 mg total) into the skin daily.     folic acid 1 MG tablet  Commonly known as:  FOLVITE  Take 1 mg by mouth daily.     furosemide 40 MG tablet  Commonly known as:  LASIX  Take 40 mg by mouth 2 (two) times daily.     LORazepam 1 MG tablet  Commonly known as:  ATIVAN  Take 1 mg by mouth daily as needed for anxiety.     ondansetron 4 MG tablet  Commonly known as:   ZOFRAN  Take 1 tablet (4 mg total) by mouth every 8 (eight) hours as needed for nausea or vomiting.     oxyCODONE 5 MG immediate release tablet  Commonly known as:  ROXICODONE  Take 1-2 tabs po q4-6 hours prn pain     pantoprazole 40 MG tablet  Commonly known as:  PROTONIX  Take 40 mg by mouth daily.     potassium chloride SA 20 MEQ tablet  Commonly known as:  K-DUR,KLOR-CON  Take 20 mEq by mouth daily.     tamsulosin 0.4 MG Caps capsule  Commonly known as:  FLOMAX  Take 0.4 mg by mouth daily.     thiamine 100 MG tablet  Commonly known as:  VITAMIN B-1  Take 100 mg by mouth daily.     vitamin B-12 1000 MCG tablet  Commonly known as:  CYANOCOBALAMIN  Take 1,000 mcg by mouth daily.        Diagnostic Studies: Dg Pelvis Portable  10/18/2015  CLINICAL DATA:  Status post right hip replacement EXAM: PORTABLE PELVIS 1-2 VIEWS COMPARISON:  09/18/2015 FINDINGS: Components of total right hip replacement in anticipated position. No acute abnormalities. IMPRESSION: Anticipated postoperative appearance Electronically Signed   By: Skipper Cliche M.D.   On: 10/18/2015 13:00    Disposition: 01-Home or Self Care    Follow-up Information    Follow up with Ninetta Lights, MD. Schedule an appointment as soon as possible for a visit in 2 weeks.   Specialty:  Orthopedic Surgery   Contact information:   [address] [place] [address] [phone]        Signed: Fannie Knee 10/20/2015, 7:25 AM

## 2015-10-19 ENCOUNTER — Encounter (HOSPITAL_COMMUNITY): Payer: Self-pay | Admitting: Orthopedic Surgery

## 2015-10-19 LAB — CBC
HCT: 21.6 % — ABNORMAL LOW (ref 39.0–52.0)
HCT: 27 % — ABNORMAL LOW (ref 39.0–52.0)
Hemoglobin: 7 g/dL — ABNORMAL LOW (ref 13.0–17.0)
Hemoglobin: 9 g/dL — ABNORMAL LOW (ref 13.0–17.0)
MCH: 31.3 pg (ref 26.0–34.0)
MCH: 30.4 pg (ref 26.0–34.0)
MCHC: 32.4 g/dL (ref 30.0–36.0)
MCHC: 33.3 g/dL (ref 30.0–36.0)
MCV: 96.4 fL (ref 78.0–100.0)
MCV: 91.2 fL (ref 78.0–100.0)
PLATELETS: 193 10*3/uL (ref 150–400)
Platelets: 180 10*3/uL (ref 150–400)
RBC: 2.24 MIL/uL — ABNORMAL LOW (ref 4.22–5.81)
RBC: 2.96 MIL/uL — ABNORMAL LOW (ref 4.22–5.81)
RDW: 14.1 % (ref 11.5–15.5)
RDW: 14.5 % (ref 11.5–15.5)
WBC: 6.3 10*3/uL (ref 4.0–10.5)
WBC: 8.8 10*3/uL (ref 4.0–10.5)

## 2015-10-19 LAB — BASIC METABOLIC PANEL
Anion gap: 6 (ref 5–15)
BUN: 10 mg/dL (ref 6–20)
CALCIUM: 8.1 mg/dL — AB (ref 8.9–10.3)
CO2: 26 mmol/L (ref 22–32)
CREATININE: 1.04 mg/dL (ref 0.61–1.24)
Chloride: 98 mmol/L — ABNORMAL LOW (ref 101–111)
Glucose, Bld: 105 mg/dL — ABNORMAL HIGH (ref 65–99)
Potassium: 3.7 mmol/L (ref 3.5–5.1)
SODIUM: 130 mmol/L — AB (ref 135–145)

## 2015-10-19 LAB — PREPARE RBC (CROSSMATCH)

## 2015-10-19 MED ORDER — FUROSEMIDE 10 MG/ML IJ SOLN
20.0000 mg | Freq: Once | INTRAMUSCULAR | Status: AC
  Administered 2015-10-19: 20 mg via INTRAVENOUS
  Filled 2015-10-19: qty 2

## 2015-10-19 MED ORDER — SODIUM CHLORIDE 0.9 % IV SOLN
Freq: Once | INTRAVENOUS | Status: AC
  Administered 2015-10-19: 09:00:00 via INTRAVENOUS

## 2015-10-19 NOTE — Care Management Note (Signed)
Case Management Note  Patient Details  Name: Jesus Daniel MRN: [id number] Date of Birth: 09-29-1946  Subjective/Objective:    Total right hip athroplasty                Action/Plan: Scheduled dc to SNF-rehab. CSW following for SNF placement. No NCM needs identified.   Expected Discharge Date:  10/21/2015               Expected Discharge Plan:  [place]  In-House Referral:  Clinical Social Work  Discharge planning Services  CM Consult   Status of Service:  Completed, signed off  Medicare Important Message Given:    Date Medicare IM Given:    Medicare IM give by:    Date Additional Medicare IM Given:    Additional Medicare Important Message give by:     If discussed at [place] of Stay Meetings, dates discussed:    Additional Comments:  Erenest Rasher, RN 10/19/2015, 1:16 PM

## 2015-10-19 NOTE — Progress Notes (Signed)
Subjective: 1 Day Post-Op Procedure(s) (LRB): TOTAL HIP ARTHROPLASTY ANTERIOR APPROACH (Right) Patient reports pain as mild.  No nausea/vomiting, lightheadedness/dizziness, chest pain/sob.  Positive flatus but no bm.  Mild urinary retention.  Objective: Vital signs in last 24 hours: Temp:  [97.7 F (36.5 C)-98.4 F (36.9 C)] 98.2 F (36.8 C) (11/10 0433) Pulse Rate:  [82-111] 100 (11/10 0433) Resp:  [16-22] 16 (11/10 0433) BP: (92-145)/(52-81) 117/66 mmHg (11/10 0433) SpO2:  [95 %-100 %] 98 % (11/10 0433) Weight:  [99.791 kg (220 lb)] 99.791 kg (220 lb) (11/09 0650)  Intake/Output from previous day: 11/09 0701 - 11/10 0700 In: 2950 [I.V.:2000; IV Piggyback:950] Out: 1050 [Urine:300; Blood:750] Intake/Output this shift: Total I/O In: 900 [I.V.:700; IV Piggyback:200] Out: -    Recent Labs  10/18/15 1032 10/18/15 1400  HGB 10.2* 8.3*    Recent Labs  10/18/15 1032 10/18/15 1400  WBC  --  12.7*  RBC  --  2.76*  HCT 30.0* 26.7*  PLT  --  303    Recent Labs  10/18/15 1032 10/18/15 1400  NA 133*  --   K 4.4  --   CREATININE  --  0.85  GLUCOSE 127*  --    No results for input(s): LABPT, INR in the last 72 hours.  Neurologically intact Neurovascular intact Sensation intact distally Intact pulses distally Dorsiflexion/Plantar flexion intact Incision: moderate drainage No cellulitis present Compartment soft  Dressing changed by me today  Assessment/Plan: 1 Day Post-Op Procedure(s) (LRB): TOTAL HIP ARTHROPLASTY ANTERIOR APPROACH (Right) Advance diet Up with therapy Discharge to SNF once approved by insurance WBAT RLE anterior hip precautions Dry dressing change prn ABLA-stable and asymptomatic.  Will continue to follow  Fannie Knee 10/19/2015, 6:42 AM

## 2015-10-19 NOTE — Clinical Social Work Placement (Signed)
   CLINICAL SOCIAL WORK PLACEMENT  NOTE  Date:  10/19/2015  Patient Details  Name: Jesus Daniel MRN: [id number] Date of Birth: 1946/01/09  Clinical Social Work is seeking post-discharge placement for this patient at the [place] level of care (*CSW will initial, date and re-position this form in  chart as items are completed):  Yes   Patient/family provided with [place] Work Department's list of facilities offering this level of care within the geographic area requested by the patient (or if unable, by the patient's family).  Yes   Patient/family informed of their freedom to choose among providers that offer the needed level of care, that participate in Medicare, Medicaid or managed care program needed by the patient, have an available bed and are willing to accept the patient.  Yes   Patient/family informed of [facility]'s ownership interest in [facility] and [facility], as well as of the fact that they are under no obligation to receive care at these facilities.  PASRR submitted to EDS on       PASRR number received on       Existing PASRR number confirmed on 10/19/15     FL2 transmitted to all facilities in geographic area requested by pt/family on       FL2 transmitted to all facilities within larger geographic area on 10/19/15     Patient informed that his/her managed care company has contracts with or will negotiate with certain facilities, including the following:            Patient/family informed of bed offers received.  Patient chooses bed at       Physician recommends and patient chooses bed at      Patient to be transferred to   on  .  Patient to be transferred to facility by       Patient family notified on   of transfer.  Name of family member notified:        PHYSICIAN Please sign FL2, Please prepare prescriptions     Additional Comment:    _______________________________________________ Dulcy Fanny, LCSW 10/19/2015, 11:07 AM

## 2015-10-19 NOTE — Clinical Social Work Note (Signed)
Clinical Social Work Assessment  Patient Details  Name: Jesus Daniel MRN: [id number] Date of Birth: September 05, 1946  Date of referral:  10/19/15               Reason for consult:  Facility Placement, Discharge Planning                Permission sought to share information with:  Chartered certified accountant granted to share information::  Yes, Verbal Permission Granted  Name::        Agency::  [facility] (prefers [employer])  Relationship::     Contact Information:     Housing/Transportation Living arrangements for the past 2 months:  [place] of Information:  Patient Patient Interpreter Needed:  None Criminal Activity/Legal Involvement Pertinent to Current Situation/Hospitalization:  No - Comment as needed Significant Relationships:  Adult Children Lives with:  Self Do you feel safe going back to the place where you live?  No (High fall risk.) Need for family participation in patient care:  No (Coment) (Patient able to make own decision.)  Care giving concerns:  Patient expressed no concerns at this time.   Social Worker assessment / plan:  CSW received referral for possible SNF placement at time of discharge. CSW met with patient to discuss discharge disposition. Patient informed CSW patient agreeable to PT recommendation for SNF placement at time of discharge. Patient expressed preference for [facility] once medically stable for discharge. CSW to continue to follow and assist with discharge planning needs.  Employment status:  Retired Glass blower/designer) PT Recommendations:  [place] / Referral to community resources:  [place]  Patient/Family's Response to care:  Patient understanding and agreeable to CSW plan of care.  Patient/Family's Understanding of and Emotional Response to Diagnosis, Current Treatment, and Prognosis:  Patient understanding and agreeable  to CSW plan of care.  Emotional Assessment Appearance:  Appears stated age Attitude/Demeanor/Rapport:  Other (Pleasant.) Affect (typically observed):  Accepting, Restless, Anxious, Pleasant Orientation:  Oriented to Self, Oriented to Place, Oriented to  Time, Oriented to Situation Alcohol / Substance use:  Not Applicable Psych involvement (Current and /or in the community):  No (Comment) (Not appropriate on this admission.)  Discharge Needs  Concerns to be addressed:  No discharge needs identified Readmission within the last 30 days:  No Current discharge risk:  None Barriers to Discharge:  No Barriers Identified   Caroline Sauger, LCSW 10/19/2015, 2:31 PM [phone]

## 2015-10-19 NOTE — NC FL2 (Signed)
[place] LEVEL OF CARE SCREENING TOOL     IDENTIFICATION  Patient Name: Jesus Daniel Birthdate: 1946-03-09 Sex: male Admission Date (Current Location): 10/18/2015  [facility] and [place] Number:     Facility and Address:  The [facility], [place] [address], [place], [place] [address]      Provider Number: [id number]  Attending Physician Name and Address:  Ninetta Lights, MD  Relative Name and Phone Number:       Current Level of Care: Hospital Recommended Level of Care: [place] Prior Approval Number:    Date Approved/Denied:   PASRR Number: [id number]  Discharge Plan: SNF    Current Diagnoses: Patient Active Problem List   Diagnosis Date Noted  . Primary localized osteoarthrosis of pelvic region 10/18/2015  . DJD (degenerative joint disease) 10/17/2015  . Anxiety 10/17/2015  . Depression 10/17/2015  . GERD (gastroesophageal reflux disease) 10/17/2015  . Arthritis 10/17/2015  . Melanoma of skin ([place]) 10/17/2015  . HTN (hypertension) 04/05/2012    Orientation ACTIVITIES/SOCIAL BLADDER RESPIRATION    Self, Time, Situation, Place    Continent Normal  BEHAVIORAL SYMPTOMS/MOOD NEUROLOGICAL BOWEL NUTRITION STATUS      Continent    PHYSICIAN VISITS COMMUNICATION OF NEEDS Height & Weight Skin    Verbally 5\' 10"  (177.8 cm) 220 lbs. Surgical wounds          AMBULATORY STATUS RESPIRATION    Assist independent Normal      Personal Care Assistance Level of Assistance  Bathing, Dressing Bathing Assistance: Limited assistance   Dressing Assistance: Limited assistance      Functional Limitations Info                SPECIAL CARE FACTORS FREQUENCY  PT (By licensed PT), OT (By licensed OT)     PT Frequency: daily OT Frequency: daily           Additional Factors Info  Isolation Precautions (MRSA)         Isolation Precautions Info: MRSA     Current Medications (10/19/2015): Current  Facility-Administered Medications  Medication Dose Route Frequency Provider Last Rate Last Dose  . 0.9 %  sodium chloride infusion   Intravenous Once Aundra Dubin, PA-C      . 0.9 % NaCl with KCl 20 mEq/ L  infusion   Intravenous Continuous Aundra Dubin, PA-C 100 mL/hr at 10/19/15 0129    . bisacodyl (DULCOLAX) suppository 10 mg  10 mg Rectal Daily PRN Aundra Dubin, PA-C      . celecoxib (CELEBREX) capsule 200 mg  200 mg Oral Q12H Aundra Dubin, PA-C   200 mg at 10/19/15 0957  . diphenhydrAMINE (BENADRYL) 12.5 MG/5ML elixir 12.5-25 mg  12.5-25 mg Oral Q4H PRN Aundra Dubin, PA-C      . docusate sodium (COLACE) capsule 100 mg  100 mg Oral BID Aundra Dubin, PA-C   100 mg at 10/19/15 0957  . enoxaparin (LOVENOX) injection 40 mg  40 mg Subcutaneous Q24H Aundra Dubin, PA-C   40 mg at 10/19/15 0855  . folic acid (FOLVITE) tablet 1 mg  1 mg Oral Daily Aundra Dubin, PA-C   1 mg at 10/19/15 [id number]  . furosemide (LASIX) injection 20 mg  20 mg Intravenous Once Aundra Dubin, PA-C      . furosemide (LASIX) tablet 40 mg  40 mg Oral BID Aundra Dubin, PA-C   40 mg at 10/19/15 0855  .  HYDROmorphone (DILAUDID) injection 0.5-1 mg  0.5-1 mg Intravenous Q2H PRN Aundra Dubin, PA-C   1 mg at 10/18/15 2045  . LORazepam (ATIVAN) tablet 1 mg  1 mg Oral Daily PRN Aundra Dubin, PA-C      . magnesium citrate solution 1 Bottle  1 Bottle Oral Once PRN Aundra Dubin, PA-C      . menthol-cetylpyridinium (CEPACOL) lozenge 3 mg  1 lozenge Oral PRN Aundra Dubin, PA-C       Or  . phenol (CHLORASEPTIC) mouth spray 1 spray  1 spray Mouth/Throat PRN Aundra Dubin, PA-C      . methocarbamol (ROBAXIN) tablet 500 mg  500 mg Oral Q6H PRN Aundra Dubin, PA-C   500 mg at 10/18/15 1130   Or  . methocarbamol (ROBAXIN) 500 mg in dextrose 5 % 50 mL IVPB  500 mg Intravenous Q6H PRN Aundra Dubin, PA-C      . metoCLOPramide (REGLAN) tablet 5-10 mg  5-10 mg Oral Q8H PRN Aundra Dubin, PA-C       Or  .  metoCLOPramide (REGLAN) injection 5-10 mg  5-10 mg Intravenous Q8H PRN Aundra Dubin, PA-C      . ondansetron [facility]) tablet 4 mg  4 mg Oral Q6H PRN Aundra Dubin, PA-C       Or  . ondansetron [facility]) injection 4 mg  4 mg Intravenous Q6H PRN Aundra Dubin, PA-C      . oxyCODONE (Oxy IR/ROXICODONE) immediate release tablet 5-10 mg  5-10 mg Oral Q3H PRN Aundra Dubin, PA-C   10 mg at 10/19/15 1024  . pantoprazole (PROTONIX) EC tablet 40 mg  40 mg Oral Daily Aundra Dubin, PA-C   40 mg at 10/19/15 0957  . potassium chloride SA (K-DUR,KLOR-CON) CR tablet 20 mEq  20 mEq Oral Daily Aundra Dubin, PA-C   20 mEq at 10/19/15 0957  . senna-docusate (Senokot-S) tablet 1 tablet  1 tablet Oral QHS PRN Aundra Dubin, PA-C      . tamsulosin [facility]) capsule 0.4 mg  0.4 mg Oral Daily Aundra Dubin, PA-C   0.4 mg at 10/19/15 0957  . thiamine (VITAMIN B-1) tablet 100 mg  100 mg Oral Daily Aundra Dubin, PA-C   100 mg at 10/19/15 0957  . vitamin B-12 (CYANOCOBALAMIN) tablet 1,000 mcg  1,000 mcg Oral Daily Aundra Dubin, PA-C   1,000 mcg at 10/19/15 0957  . zolpidem (AMBIEN) tablet 5 mg  5 mg Oral QHS PRN Aundra Dubin, PA-C       Do not use this list as official medication orders. Please verify with discharge summary.  Discharge Medications:   Medication List    STOP taking these medications        HYDROcodone-acetaminophen 7.5-325 MG tablet  Commonly known as:  NORCO      TAKE these medications        bisacodyl 5 MG EC tablet  Commonly known as:  DULCOLAX  Take 1 tablet (5 mg total) by mouth daily as needed for moderate constipation.     cholecalciferol 1000 UNITS tablet  Commonly known as:  VITAMIN D  Take 1,000 Units by mouth daily.     enoxaparin 40 MG/0.4ML injection  Commonly known as:  LOVENOX  Inject 0.4 mLs (40 mg total) into the skin daily.     folic acid 1 MG tablet  Commonly known as:  FOLVITE  Take 1 mg by mouth daily.  furosemide 40 MG tablet   Commonly known as:  LASIX  Take 40 mg by mouth 2 (two) times daily.     LORazepam 1 MG tablet  Commonly known as:  ATIVAN  Take 1 mg by mouth daily as needed for anxiety.     ondansetron 4 MG tablet  Commonly known as:  ZOFRAN  Take 1 tablet (4 mg total) by mouth every 8 (eight) hours as needed for nausea or vomiting.     oxyCODONE 5 MG immediate release tablet  Commonly known as:  ROXICODONE  Take 1-2 tabs po q4-6 hours prn pain     pantoprazole 40 MG tablet  Commonly known as:  PROTONIX  Take 40 mg by mouth daily.     potassium chloride SA 20 MEQ tablet  Commonly known as:  K-DUR,KLOR-CON  Take 20 mEq by mouth daily.     tamsulosin 0.4 MG Caps capsule  Commonly known as:  FLOMAX  Take 0.4 mg by mouth daily.     thiamine 100 MG tablet  Commonly known as:  VITAMIN B-1  Take 100 mg by mouth daily.     vitamin B-12 1000 MCG tablet  Commonly known as:  CYANOCOBALAMIN  Take 1,000 mcg by mouth daily.        Relevant Imaging Results:  Relevant Lab Results:  Recent Labs    Additional Information SSN [id number]  Dulcy Fanny, LCSW

## 2015-10-19 NOTE — Progress Notes (Signed)
Physical Therapy Treatment Patient Details Name: Jesus Daniel MRN: [id number] DOB: [birth year] Today's Date: 10/19/2015    History of Present Illness 69 y.o. male s/p Rt. anterior THA    PT Comments    Patient seen for PT session. Noted decreased blood labs today and patient receiving blood at this time. Session focusing on HEP. Will continue to follow and attempt ambulation as time allows. Continue to recommend SNF at D/C.   Follow Up Recommendations  SNF;Supervision for mobility/OOB     Equipment Recommendations  None recommended by PT    Recommendations for Other Services       Precautions / Restrictions Precautions Precautions: Anterior Hip;Fall Precaution Booklet Issued: Yes (comment) Precaution Comments: anterior precautions reviewed and posted, patient 0/3 recall Restrictions Weight Bearing Restrictions: Yes RLE Weight Bearing: Weight bearing as tolerated    Mobility  Bed Mobility               General bed mobility comments: session performed in bed.   Transfers                    Ambulation/Gait                 Stairs            Wheelchair Mobility    Modified Rankin (Stroke Patients Only)       Balance                                    Cognition Arousal/Alertness: Awake/alert Behavior During Therapy: WFL for tasks assessed/performed Overall Cognitive Status: Within Functional Limits for tasks assessed                      Exercises Total Joint Exercises Ankle Circles/Pumps: AROM;Both;15 reps Quad Sets: Right;Strengthening;10 reps Gluteal Sets: Strengthening;Both;10 reps Short Arc Quad: Strengthening;Right;10 reps Heel Slides: AAROM;Right;10 reps    General Comments        Pertinent Vitals/Pain Pain Assessment: 0-10 Pain Score: 5  Pain Location: Rt hip Pain Descriptors / Indicators: Sore Pain Intervention(s): Limited activity within patient's tolerance;Monitored during session     Home Living                      Prior Function            PT Goals (current goals can now be found in the care plan section) Acute Rehab PT Goals Patient Stated Goal: go for more rehab before going home PT Goal Formulation: With patient Time For Goal Achievement: 11/01/15 Potential to Achieve Goals: Good Progress towards PT goals: Progressing toward goals    Frequency  7X/week    PT Plan Current plan remains appropriate    Co-evaluation             End of Session   Activity Tolerance: Patient tolerated treatment well Patient left: in bed;with call bell/phone within reach;with SCD's reapplied     Time: [id number] PT Time Calculation (min) (ACUTE ONLY): 13 min  Charges:  $Therapeutic Exercise: 8-22 mins                    G Codes:      Cassell Clement, PT, CSCS Pager [phone] Office [phone]  10/19/2015, 12:32 PM

## 2015-10-19 NOTE — Evaluation (Signed)
Occupational Therapy Evaluation Patient Details Name: Jesus Daniel MRN: [id number] DOB: August 08, 1946 Today's Date: 10/19/2015    History of Present Illness 69 y.o. male s/p Rt. anterior THA   Clinical Impression   Pt reports he was independent with ADLs but required assist with mobility PTA. Currently pt is overall mod A for ADLs and mobility. Began ADL and safety education with pt; pt verbalized understanding. Recommending SNF for further rehab prior to returning home in order to maximize independence and safety with ADLs and functional mobility. All further OT needs can be met at the next venue of care; signing off at this time. Please re-consult if change in medical status occurs. Thank you for this referral.    Follow Up Recommendations  SNF;Supervision - Intermittent    Equipment Recommendations  Other (comment) (TBD at next venue )    Recommendations for Other Services       Precautions / Restrictions Precautions Precautions: Anterior Hip;Fall Precaution Booklet Issued: No Precaution Comments: Pt able to recall 2/3 precautions. Reviewed all precautions with pt.  Restrictions Weight Bearing Restrictions: Yes RLE Weight Bearing: Weight bearing as tolerated      Mobility Bed Mobility Overal bed mobility: Needs Assistance Bed Mobility: Supine to Sit;Sit to Supine     Supine to sit: Min assist Sit to supine: Mod assist   General bed mobility comments: Assist with R LE. Required stabalization at trunk when coming to sitting position  Transfers Overall transfer level: Needs assistance Equipment used: Rolling walker (2 wheeled) Transfers: Sit to/from Stand Sit to Stand: Mod assist;From elevated surface         General transfer comment: Mod A to boost up from EOB at elevated height. VC for hand placement and technique. Pt attempted to come to standing x 1 prior to achieving sit <> stand    Balance Overall balance assessment: Needs assistance Sitting-balance  support: Single extremity supported Sitting balance-Leahy Scale: Poor     Standing balance support: Bilateral upper extremity supported Standing balance-Leahy Scale: Poor Standing balance comment: RW for support                             ADL Overall ADL's : Needs assistance/impaired Eating/Feeding: Set up;Sitting   Grooming: Set up;Sitting       Lower Body Bathing: Moderate assistance;Sit to/from stand   Upper Body Dressing : Min guard;Sitting Upper Body Dressing Details (indicate cue type and reason): Able to doff/don hospital gown Lower Body Dressing: Moderate assistance;Sit to/from stand   Toilet Transfer: BSC;RW;Moderate Scientist, forensic Details (indicate cue type and reason): simulated toilet transfer   Toileting - Clothing Manipulation Details (indicate cue type and reason): Pt able to use urinal at bed level with set up     Functional mobility during ADLs: Rolling walker;Moderate assistance General ADL Comments: No family present for OT eval. Educated pt on precautions, safety with RW; pt verbalized understanding.     Vision     Perception     Praxis      Pertinent Vitals/Pain Pain Assessment: Faces Pain Score: 5  Faces Pain Scale: Hurts even more Pain Location: R hip with movement Pain Descriptors / Indicators: Grimacing;Guarding Pain Intervention(s): Limited activity within patient's tolerance;Monitored during session;Repositioned;Ice applied     Hand Dominance     Extremity/Trunk Assessment Upper Extremity Assessment Upper Extremity Assessment: Overall WFL for tasks assessed   Lower Extremity Assessment Lower Extremity Assessment: Defer to PT evaluation  Communication Communication Communication: No difficulties   Cognition Arousal/Alertness: Awake/alert Behavior During Therapy: WFL for tasks assessed/performed Overall Cognitive Status: Within Functional Limits for tasks assessed       Memory:  Decreased recall of precautions             General Comments       Exercises Exercises: Total Joint     Shoulder Instructions      Home Living Family/patient expects to be discharged to:: Skilled nursing facility                                        Prior Functioning/Environment Level of Independence: Independent with assistive device(s);Needs assistance    ADL's / Homemaking Assistance Needed: Pt reports he needed assistance getting in/out of the shower PTA and getting to the bathroom. He was able to complete ADLs on his own but required assist with functional mobility.   Comments: reports using SPC    OT Diagnosis: Generalized weakness;Acute pain   OT Problem List:     OT Treatment/Interventions:      OT Goals(Current goals can be found in the care plan section) Acute Rehab OT Goals Patient Stated Goal: to go to rehab before going home OT Goal Formulation: With patient  OT Frequency:     Barriers to D/C:            Co-evaluation              End of Session Equipment Utilized During Treatment: Gait belt;Rolling walker Nurse Communication: Other (comment) (check IV, emptied 100 urine )  Activity Tolerance: Patient tolerated treatment well Patient left: in bed;with call bell/phone within reach;with nursing/sitter in room   Time: 1321-1344 OT Time Calculation (min): 23 min Charges:  OT General Charges $OT Visit: 1 Procedure OT Evaluation $Initial OT Evaluation Tier I: 1 Procedure OT Treatments $Self Care/Home Management : 8-22 mins G-Codes:     Binnie Kand M.S., OTR/L Pager: [phone]  10/19/2015, 1:58 PM

## 2015-10-19 NOTE — Progress Notes (Signed)
Physical Therapy Treatment Patient Details Name: Jesus Daniel MRN: [id number] DOB: 12-09-1946 Today's Date: 10/19/2015    History of Present Illness 69 y.o. male s/p Rt. anterior THA    PT Comments    Jesus Daniel progressed well today, ambulating 40 ft w/ RW into hallway w/ min assist at times for managing RW. Pt will benefit from continued skilled PT services to increase functional independence and safety.  Follow Up Recommendations  SNF;Supervision for mobility/OOB     Equipment Recommendations  None recommended by PT    Recommendations for Other Services       Precautions / Restrictions Precautions Precautions: Anterior Hip;Fall Precaution Booklet Issued: Yes (comment) Precaution Comments: anterior precautions reviewed, patient 2/3 recall, omitting ER precaution Restrictions Weight Bearing Restrictions: Yes RLE Weight Bearing: Weight bearing as tolerated    Mobility  Bed Mobility Overal bed mobility: Needs Assistance Bed Mobility: Supine to Sit;Sit to Supine     Supine to sit: Min guard Sit to supine: Min assist   General bed mobility comments: Pt uses bed rail and HOB elevated for supine>sit w/ increased time.  Min assist to manage Rt LE w/ return to supine in bed.  Transfers Overall transfer level: Needs assistance Equipment used: Rolling walker (2 wheeled) Transfers: Sit to/from Stand Sit to Stand: Min assist;From elevated surface         General transfer comment: Min assist to power up to standing.  Cues for hand placement and to stand upright once up.  Ambulation/Gait Ambulation/Gait assistance: Min assist Ambulation Distance (Feet): 40 Feet Assistive device: Rolling walker (2 wheeled) Gait Pattern/deviations: Step-to pattern;Trunk flexed;Antalgic;Decreased weight shift to right;Decreased stride length Gait velocity: decreased Gait velocity interpretation: <1.8 ft/sec, indicative of risk for recurrent falls General Gait Details: Pt requires max  reminders to stand upright as he has tendency to push RW too far ahead of him.  Min assist at times to prevent RW from moving too far ahead.     Stairs            Wheelchair Mobility    Modified Rankin (Stroke Patients Only)       Balance Overall balance assessment: Needs assistance Sitting-balance support: Feet supported;Bilateral upper extremity supported Sitting balance-Leahy Scale: Good     Standing balance support: Bilateral upper extremity supported;During functional activity Standing balance-Leahy Scale: Poor Standing balance comment: Relies on RW and min assist at times for support                    Cognition Arousal/Alertness: Awake/alert Behavior During Therapy: WFL for tasks assessed/performed Overall Cognitive Status: Within Functional Limits for tasks assessed       Memory: Decreased recall of precautions              Exercises Total Joint Exercises Ankle Circles/Pumps: AROM;Both;Seated;10 reps General Exercises - Lower Extremity Long Arc Quad: AROM;Right;10 reps;Seated    General Comments        Pertinent Vitals/Pain Pain Assessment: 0-10 Pain Score: 5  Faces Pain Scale: Hurts even more Pain Location: Rt hip Pain Descriptors / Indicators: Aching;Discomfort Pain Intervention(s): Limited activity within patient's tolerance;Monitored during session    Home Living Family/patient expects to be discharged to:: Skilled nursing facility                    Prior Function Level of Independence: Independent with assistive device(s);Needs assistance    ADL's / Homemaking Assistance Needed: Pt reports he needed assistance getting in/out of the shower PTA  and getting to the bathroom. He was able to complete ADLs on his own but required assist with functional mobility. Comments: reports using SPC   PT Goals (current goals can now be found in the care plan section) Acute Rehab PT Goals Patient Stated Goal: go for more rehab before  going home PT Goal Formulation: With patient Time For Goal Achievement: 11/01/15 Potential to Achieve Goals: Good Progress towards PT goals: Progressing toward goals    Frequency  7X/week    PT Plan Current plan remains appropriate    Co-evaluation             End of Session Equipment Utilized During Treatment: Gait belt Activity Tolerance: Patient tolerated treatment well;Patient limited by fatigue Patient left: in bed;with call bell/phone within reach;with SCD's reapplied     Time: [id number] PT Time Calculation (min) (ACUTE ONLY): 22 min  Charges:  $Gait Training: 8-22 mins                    G Codes:      Joslyn Hy PT, [place] [id number] Pager: [phone] 10/19/2015, 5:12 PM

## 2015-10-19 NOTE — Op Note (Signed)
NAMEDERYCK, SWOPES NO.:  [id number]  MEDICAL RECORD NO.:  [id number]  LOCATION:  [id number]                        FACILITY:  [facility]  PHYSICIAN:  Ninetta Lights, M.D. DATE OF BIRTH:  27-May-1946  DATE OF PROCEDURE:  10/18/2015 DATE OF DISCHARGE:                              OPERATIVE REPORT   PREOPERATIVE DIAGNOSES:  Right hip end-stage arthritis, marked destructive changes, superior lateral migration of the femoral head.  POSTOPERATIVE DIAGNOSES:  Right hip end-stage arthritis, marked destructive changes, superior lateral migration of the femoral head with marked scarring adhesions, loss of motion.  PROCEDURE:  Direct anterior right total hip replacement for primary localized end-stage arthritis.  Stryker prosthesis.  Press-fit 56 mm acetabular component screw fixation x2.  A 36 mm internal diameter polyethylene liner.  Press-fit #6 Accolade stem 36+ 0 Bio-Lock head.  SURGEON:  Ninetta Lights, M.D.  ASSISTANT:  Elmyra Ricks, PA, present throughout the entire case and necessary for timely completion of procedure.  ANESTHESIA:  General.  BLOOD LOSS:  500 mL.  BLOOD GIVEN:  None.  SPECIMENS:  None.  COUNTS:  None.  COMPLICATIONS:  None.  DRESSINGS:  Soft compressive.  DESCRIPTION OF PROCEDURE:  The patient was brought to the operating room, placed on the operating table in supine position.  After adequate anesthesia had been obtained, prepped and draped in usual sterile fashion.  His hip had no extension about a 10 degree flexion contracture.  He had  external rotation contracture.  I could not even bring him to neutral.  Direct anterior approach overlying the tensor. Skin and subcutaneous tissue divided.  Fascia of the tensor incised, muscles retracted anteriorly.  I then did a large resection of the anterior capsule.  All adhesions and scar tissue throughout.  Hip exposed.  Femoral neck had 1 fingerbreadth above the lesser  trochanter. Napkin ring taken out, then the head removed.  Marked destructive changes throughout the acetabulum.  Redundant labrum debrided.  Brought up to good sizing and fitting for a 36 mm component which was seated at 40 degrees of abduction, minimal anteversion.  Good capturing and fixation, augmented with 2 screws through the cup.  36 mm internal diameter liner.  With a lot of work, the femur was then freed up.  The broaches and rasps were used to bring in good fitting.  After appropriate trials, I utilized a #6 Accolade stem.  36+ 0 Bio-Lock head. With this construct, the hip reduced, had great motion, equal leg lengths.  Good stability.  Wound thoroughly irrigated, injected with Exparel.  Deep fascia closed with Vicryl.  Subcutaneous and subcuticular closure.  Margins were injected with Marcaine.  Sterile compressive dressing applied.  Anesthesia reversed.  Brought to recovery room. Tolerated the surgery well.  No complications.     Ninetta Lights, M.D.     DFM/MEDQ  D:  10/18/2015  T:  10/19/2015  Job:  [id number]

## 2015-10-19 NOTE — Anesthesia Postprocedure Evaluation (Signed)
  Anesthesia Post-op Note  Patient: Jesus Daniel  Procedure(s) Performed: Procedure(s) (LRB): TOTAL HIP ARTHROPLASTY ANTERIOR APPROACH (Right)  Patient Location: PACU  Anesthesia Type: General  Level of Consciousness: awake and alert   Airway and Oxygen Therapy: Patient Spontanous Breathing  Post-op Pain: mild  Post-op Assessment: Post-op Vital signs reviewed, Patient's Cardiovascular Status Stable, Respiratory Function Stable, Patent Airway and No signs of Nausea or vomiting  Last Vitals:  Filed Vitals:   10/19/15 1024  BP: 107/60  Pulse: 96  Temp: 37.1 C  Resp: 16    Post-op Vital Signs: stable   Complications: No apparent anesthesia complications

## 2015-10-20 LAB — BASIC METABOLIC PANEL
ANION GAP: 9 (ref 5–15)
BUN: 13 mg/dL (ref 6–20)
CALCIUM: 8.5 mg/dL — AB (ref 8.9–10.3)
CO2: 28 mmol/L (ref 22–32)
Chloride: 97 mmol/L — ABNORMAL LOW (ref 101–111)
Creatinine, Ser: 0.96 mg/dL (ref 0.61–1.24)
GFR calc Af Amer: >60 mL/min (ref >60)
GFR calc non Af Amer: >60 mL/min (ref >60)
GLUCOSE: 109 mg/dL — AB (ref 65–99)
Potassium: 3.5 mmol/L (ref 3.5–5.1)
Sodium: 134 mmol/L — ABNORMAL LOW (ref 135–145)

## 2015-10-20 LAB — TYPE AND SCREEN
ABO/RH(D): A POS
ANTIBODY SCREEN: NEGATIVE
UNIT DIVISION: 0
Unit division: 0

## 2015-10-20 LAB — CBC
HCT: 28.5 % — ABNORMAL LOW (ref 39.0–52.0)
Hemoglobin: 9.3 g/dL — ABNORMAL LOW (ref 13.0–17.0)
MCH: 29.9 pg (ref 26.0–34.0)
MCHC: 32.6 g/dL (ref 30.0–36.0)
MCV: 91.6 fL (ref 78.0–100.0)
PLATELETS: 208 10*3/uL (ref 150–400)
RBC: 3.11 MIL/uL — ABNORMAL LOW (ref 4.22–5.81)
RDW: 15 % (ref 11.5–15.5)
WBC: 9.3 10*3/uL (ref 4.0–10.5)

## 2015-10-20 NOTE — Discharge Planning (Signed)
Patient to be discharged to [facility]. Patient updated at bedside.  [employer] authorization: [id number]  Facility: [facility] RN report number: [phone] Transportation: Patient's son via car  Lubertha Sayres, [facility]: [id number] Surgical: [phone]

## 2015-10-20 NOTE — Progress Notes (Signed)
Physical Therapy Treatment Patient Details Name: Jesus Daniel MRN: [id number] DOB: 1946/11/22 Today's Date: November 01, 2015    History of Present Illness 69 y.o. male s/p Rt. anterior THA    PT Comments    Pt very pleasant and able to recall 2/3 precautions. Pt with difficulty rising from low surfaces and had been using lift chair at home. Pt with natural tendency for external rotation of RLE with cues to prevent and blocked with foam roll in chair end of session. Educated for [employer] and encouraged. Will continue to follow.   Follow Up Recommendations  SNF;Supervision for mobility/OOB     Equipment Recommendations       Recommendations for Other Services       Precautions / Restrictions Precautions Precautions: Anterior Hip;Fall Precaution Comments: anterior precautions reviewed, patient 2/3 recall, omitting ER precaution Restrictions RLE Weight Bearing: Weight bearing as tolerated    Mobility  Bed Mobility Overal bed mobility: Needs Assistance Bed Mobility: Supine to Sit     Supine to sit: Supervision     General bed mobility comments: cues for sequence with supervision to maintain precautions  Transfers Overall transfer level: Needs assistance   Transfers: Sit to/from Stand Sit to Stand: Min assist         General transfer comment: cues for hand placement with difficulty rising from surface and assist for anterior translation  Ambulation/Gait Ambulation/Gait assistance: Min guard Ambulation Distance (Feet): 120 Feet Assistive device: Rolling walker (2 wheeled) Gait Pattern/deviations: Step-through pattern;Decreased stride length;Trunk flexed   Gait velocity interpretation: Below normal speed for age/gender General Gait Details: cues for posture and internal rotation of RLE. Baseline with bil ER    Stairs            Wheelchair Mobility    Modified Rankin (Stroke Patients Only)       Balance                                     Cognition Arousal/Alertness: Awake/alert Behavior During Therapy: WFL for tasks assessed/performed Overall Cognitive Status: Within Functional Limits for tasks assessed       Memory: Decreased recall of precautions              Exercises Total Joint Exercises Heel Slides: AROM;Seated;Right;15 reps Long Arc Quad: AROM;Seated;Right;15 reps Marching in Standing: AROM;Standing;Both;15 reps    General Comments        Pertinent Vitals/Pain Pain Assessment: No/denies pain    Home Living                      Prior Function            PT Goals (current goals can now be found in the care plan section) Progress towards PT goals: Progressing toward goals    Frequency       PT Plan Current plan remains appropriate    Co-evaluation             End of Session Equipment Utilized During Treatment: Gait belt Activity Tolerance: Patient tolerated treatment well Patient left: in chair;with call bell/phone within reach     Time: 1133-1153 PT Time Calculation (min) (ACUTE ONLY): 20 min  Charges:  $Gait Training: 8-22 mins                    G Codes:      Melford Aase 2015/11/01, 12:09 PM  Malichi Palardy  Pam Drown, [place]

## 2015-10-20 NOTE — Progress Notes (Signed)
Pt ready for discharge. Report called to Tanya--RN at [facility] and all questions answered/clarified. IV removed, and pt's belongings and hard copy of his prescriptions will be sent with his son who will be transporting him to [facility]. Pt will be transported out via wheelchair. Will continue to monitor. Zola Button, RN

## 2015-10-20 NOTE — Clinical Social Work Placement (Signed)
   CLINICAL SOCIAL WORK PLACEMENT  NOTE  Date:  10/20/2015  Patient Details  Name: Jesus Daniel MRN: [id number] Date of Birth: [birth year]  Clinical Social Work is seeking post-discharge placement for this patient at the [place] level of care (*CSW will initial, date and re-position this form in  chart as items are completed):  Yes   Patient/family provided with [facility] Work Department's list of facilities offering this level of care within the geographic area requested by the patient (or if unable, by the patient's family).  Yes   Patient/family informed of their freedom to choose among providers that offer the needed level of care, that participate in Medicare, Medicaid or managed care program needed by the patient, have an available bed and are willing to accept the patient.  Yes   Patient/family informed of [facility]'s ownership interest in [facility] and [facility], as well as of the fact that they are under no obligation to receive care at these facilities.  PASRR submitted to EDS on       PASRR number received on       Existing PASRR number confirmed on 10/19/15     FL2 transmitted to all facilities in geographic area requested by pt/family on       FL2 transmitted to all facilities within larger geographic area on 10/19/15     Patient informed that his/her managed care company has contracts with or will negotiate with certain facilities, including the following:        Yes   Patient/family informed of bed offers received.  Patient chooses bed at [facility]     Physician recommends and patient chooses bed at      Patient to be transferred to [facility] on 10/20/15.  Patient to be transferred to facility by Car     Patient family notified on 10/20/15 of transfer.  Name of family member notified:  Patient at bedside.     PHYSICIAN       Additional Comment:     _______________________________________________ Caroline Sauger, LCSW 10/20/2015, 2:10 PM

## 2015-10-20 NOTE — Progress Notes (Signed)
Subjective: 2 Days Post-Op Procedure(s) (LRB): TOTAL HIP ARTHROPLASTY ANTERIOR APPROACH (Right) Patient reports pain as mild.  No nausea/vomiting, lightheadedness/dizziness, chest pain/sob.  Positive flatus but no bm.  Tolerating diet.  Objective: Vital signs in last 24 hours: Temp:  [97.8 F (36.6 C)-99.2 F (37.3 C)] 99.2 F (37.3 C) (11/11 0504) Pulse Rate:  [85-107] 85 (11/11 0504) Resp:  [14-18] 18 (11/11 0504) BP: (100-131)/(60-68) 112/63 mmHg (11/11 0504) SpO2:  [95 %-100 %] 98 % (11/11 0504)  Intake/Output from previous day: 11/10 0701 - 11/11 0700 In: 1390 [P.O.:720; Blood:670] Out: 1700 [Urine:1650; Blood:50] Intake/Output this shift:     Recent Labs  10/18/15 1032 10/18/15 1400 10/19/15 0722 10/19/15 2035 10/20/15 0535  HGB 10.2* 8.3* 7.0* 9.0* 9.3*    Recent Labs  10/19/15 2035 10/20/15 0535  WBC 8.8 9.3  RBC 2.96* 3.11*  HCT 27.0* 28.5*  PLT 193 208    Recent Labs  10/19/15 0722 10/20/15 0535  NA 130* 134*  K 3.7 3.5  CL 98* 97*  CO2 26 28  BUN 10 13  CREATININE 1.04 0.96  GLUCOSE 105* 109*  CALCIUM 8.1* 8.5*   No results for input(s): LABPT, INR in the last 72 hours.  Neurologically intact Neurovascular intact Sensation intact distally Intact pulses distally Dorsiflexion/Plantar flexion intact Compartment soft  Assessment/Plan: 2 Days Post-Op Procedure(s) (LRB): TOTAL HIP ARTHROPLASTY ANTERIOR APPROACH (Right) Advance diet Up with therapy Discharge to SNF today vs. Tomorrow (pending insurance approval) wbat RLE-anterior hip precautions Dry dressing change prn ABLA-trending up after transfusion with 2 units PRBC yesterday  Fannie Knee 10/20/2015, 7:46 AM

## 2015-10-23 ENCOUNTER — Encounter: Payer: Self-pay | Admitting: Adult Health

## 2015-10-23 ENCOUNTER — Non-Acute Institutional Stay (SKILLED_NURSING_FACILITY): Payer: Commercial Managed Care - HMO | Admitting: Adult Health

## 2015-10-23 DIAGNOSIS — M1611 Unilateral primary osteoarthritis, right hip: Secondary | ICD-10-CM | POA: Diagnosis not present

## 2015-10-23 DIAGNOSIS — K219 Gastro-esophageal reflux disease without esophagitis: Secondary | ICD-10-CM

## 2015-10-23 DIAGNOSIS — I10 Essential (primary) hypertension: Secondary | ICD-10-CM

## 2015-10-23 DIAGNOSIS — E43 Unspecified severe protein-calorie malnutrition: Secondary | ICD-10-CM

## 2015-10-23 DIAGNOSIS — R3911 Hesitancy of micturition: Secondary | ICD-10-CM | POA: Diagnosis not present

## 2015-10-23 DIAGNOSIS — D62 Acute posthemorrhagic anemia: Secondary | ICD-10-CM

## 2015-10-23 DIAGNOSIS — R609 Edema, unspecified: Secondary | ICD-10-CM | POA: Diagnosis not present

## 2015-10-23 NOTE — Progress Notes (Signed)
Patient ID: ANANTH MAISONET, male   DOB: 06/17/1946, 69 y.o.   MRN: [id number]    DATE:  10/23/2015   MRN:  [id number]  BIRTHDAY: 03-30-1946  Facility:  Nursing Home Location:  [facility] Room Number: 508-P  LEVEL OF CARE:  SNF (31)  Contact Information    Name Relation Home Work Rice "Sheridan" Son   [phone]   Keionte, Pflanz   [phone]       Chief Complaint  Patient presents with  . Hospitalization Follow-up    Osteoarthritis S/P right total hip arthroplasty, anxiety, GERD, urinary hesitancy, anemia, protein calorie malnutrition and edema    HISTORY OF PRESENT ILLNESS:  This is a 69 year old male who has been admitted to [facility] on 10/20/15 from [facility] He has PMH of GERD, depression, anxiety, skin cancer, urinary hesitancy, arthritis and gout. He has osteoarthritis of right hip for which she had right total hip arthroplasty on 10/18/15.  He has been admitted for a short-term rehabilitation.  PAST MEDICAL HISTORY:  Past Medical History  Diagnosis Date  . GERD (gastroesophageal reflux disease)   . Depression   . Anxiety   . Cancer ([place])     skin  . Urinary hesitancy   . Arthritis     "probably in my knees" (10/18/2015)  . History of gout      CURRENT MEDICATIONS: Reviewed  Patient's Medications  New Prescriptions   No medications on file  Previous Medications   BISACODYL (DULCOLAX) 5 MG EC TABLET    Take 1 tablet (5 mg total) by mouth daily as needed for moderate constipation.   CHOLECALCIFEROL (VITAMIN D) 1000 UNITS TABLET    Take 1,000 Units by mouth daily.   ENOXAPARIN (LOVENOX) 40 MG/0.4ML INJECTION    Inject 0.4 mLs (40 mg total) into the skin daily.   FOLIC ACID (FOLVITE) 1 MG TABLET    Take 1 mg by mouth daily.   FUROSEMIDE (LASIX) 40 MG TABLET    Take 40 mg by mouth 2 (two) times daily.   LORAZEPAM (ATIVAN) 1 MG TABLET    Take 1 mg by mouth daily as needed for anxiety.    ONDANSETRON  (ZOFRAN) 4 MG TABLET    Take 1 tablet (4 mg total) by mouth every 8 (eight) hours as needed for nausea or vomiting.   OXYCODONE (ROXICODONE) 5 MG IMMEDIATE RELEASE TABLET    Take 1-2 tabs po q4-6 hours prn pain   PANTOPRAZOLE (PROTONIX) 40 MG TABLET    Take 40 mg by mouth daily.   POTASSIUM CHLORIDE SA (K-DUR,KLOR-CON) 20 MEQ TABLET    Take 20 mEq by mouth daily.   TAMSULOSIN (FLOMAX) 0.4 MG CAPS CAPSULE    Take 0.4 mg by mouth daily.   THIAMINE (VITAMIN B-1) 100 MG TABLET    Take 100 mg by mouth daily.   VITAMIN B-12 (CYANOCOBALAMIN) 1000 MCG TABLET    Take 1,000 mcg by mouth daily.  Modified Medications   No medications on file  Discontinued Medications   No medications on file     No Known Allergies   REVIEW OF SYSTEMS:  GENERAL: no change in appetite, no fatigue, no weight changes, no fever, chills or weakness EYES: Denies change in vision, dry eyes, eye pain, itching or discharge EARS: Denies change in hearing, ringing in ears, or earache NOSE: Denies nasal congestion or epistaxis MOUTH and THROAT: Denies oral discomfort, gingival pain or bleeding, pain from  teeth or hoarseness   RESPIRATORY: no cough, SOB, DOE, wheezing, hemoptysis CARDIAC: no chest pain, edema or palpitations GI: no abdominal pain, diarrhea, constipation, heart burn, nausea or vomiting GU: Denies dysuria, frequency, hematuria, incontinence, or discharge PSYCHIATRIC: Denies feeling of depression or anxiety. No report of hallucinations, insomnia, paranoia, or agitation   PHYSICAL EXAMINATION  GENERAL APPEARANCE: Well nourished. In no acute distress. Normal body habitus SKIN:  Right hip surgical incision has steri-strip and dressing, wound is wet with serosanguinous drainage, wound is intact HEAD: Normal in size and contour. No evidence of trauma EYES: Lids open and close normally. No blepharitis, entropion or ectropion. PERRL. Conjunctivae are clear and sclerae are white. Lenses are without opacity EARS:  Pinnae are normal. Patient hears normal voice tunes of the examiner MOUTH and THROAT: Lips are without lesions. Oral mucosa is moist and without lesions. Tongue is normal in shape, size, and color and without lesions NECK: supple, trachea midline, no neck masses, no thyroid tenderness, no thyromegaly LYMPHATICS: no LAN in the neck, no supraclavicular LAN RESPIRATORY: breathing is even & unlabored, BS CTAB CARDIAC: RRR, no murmur,no extra heart sounds, RLE edema 2+ GI: abdomen soft, normal BS, no masses, no tenderness, no hepatomegaly, no splenomegaly PSYCHIATRIC: Alert and oriented X 3. Affect and behavior are appropriate  LABS/RADIOLOGY: Labs reviewed: Basic Metabolic Panel:  Recent Labs  10/09/15 1159 10/18/15 1032 10/18/15 1400 10/19/15 0722 10/20/15 0535  NA 137 133*  --  130* 134*  K 4.6 4.4  --  3.7 3.5  CL 97*  --   --  98* 97*  CO2 29  --   --  26 28  GLUCOSE 96 127*  --  105* 109*  BUN 7  --   --  10 13  CREATININE 0.83  --  0.85 1.04 0.96  CALCIUM 9.3  --   --  8.1* 8.5*   Liver Function Tests:  Recent Labs  10/09/15 1159  AST 34  ALT 11*  ALKPHOS 101  BILITOT 0.7  PROT 7.0  ALBUMIN 2.8*   CBC:  Recent Labs  10/09/15 1159  10/19/15 0722 10/19/15 2035 10/20/15 0535  WBC 7.4  < > 6.3 8.8 9.3  NEUTROABS 5.1  --   --   --   --   HGB 10.1*  < > 7.0* 9.0* 9.3*  HCT 31.7*  < > 21.6* 27.0* 28.5*  MCV 99.1  < > 96.4 91.2 91.6  PLT 311  < > 180 193 208  < > = values in this interval not displayed.   Dg Pelvis Portable  10/18/2015  CLINICAL DATA:  Status post right hip replacement EXAM: PORTABLE PELVIS 1-2 VIEWS COMPARISON:  09/18/2015 FINDINGS: Components of total right hip replacement in anticipated position. No acute abnormalities. IMPRESSION: Anticipated postoperative appearance Electronically Signed   By: Skipper Cliche M.D.   On: 10/18/2015 13:00    ASSESSMENT/PLAN:  Osteoarthritis S/P right total hip arthroplasty - for rehabilitation; continue  Lovenox 40 mg subcutaneous daily for DVT prophylaxis; oxycodone 5 mg IR 1-2 tabs by mouth every 4 hours when necessary for pain; follow-up with Dr. Percell Miller, orthopedic surgeon, in 2 weeks  Anxiety  - mood this is stable; continue Ativan 1 mg by mouth daily when necessary   GERD - continue Protonix 40 mg 1 tab by mouth daily  Hypertension - well controlled; continue Lasix 40 mg by mouth twice a day; check BMP  Edema of lower extremity - continue Lasix and KCl supplementation  Urinary  hesitancy - continue Flomax 0.4 mg 1 capsule by mouth daily  Anemia, acute blood loss -  S/P transfusion of 2 units packed RBC; hemoglobin drop to 7 and now 9.3; check CBC   Protein calorie malnutrition, severe - albumin 2.8; start Procel 2 scoops by mouth twice a day; RD consultation     Goals of care:  Short-term rehabilitation    [place], NP [place]

## 2015-10-24 ENCOUNTER — Non-Acute Institutional Stay (SKILLED_NURSING_FACILITY): Payer: Commercial Managed Care - HMO | Admitting: Internal Medicine

## 2015-10-24 DIAGNOSIS — K219 Gastro-esophageal reflux disease without esophagitis: Secondary | ICD-10-CM

## 2015-10-24 DIAGNOSIS — M1990 Unspecified osteoarthritis, unspecified site: Secondary | ICD-10-CM

## 2015-10-24 DIAGNOSIS — D62 Acute posthemorrhagic anemia: Secondary | ICD-10-CM

## 2015-10-24 DIAGNOSIS — R600 Localized edema: Secondary | ICD-10-CM | POA: Diagnosis not present

## 2015-10-24 DIAGNOSIS — F411 Generalized anxiety disorder: Secondary | ICD-10-CM

## 2015-10-24 DIAGNOSIS — M1611 Unilateral primary osteoarthritis, right hip: Secondary | ICD-10-CM | POA: Insufficient documentation

## 2015-10-24 DIAGNOSIS — R2681 Unsteadiness on feet: Secondary | ICD-10-CM

## 2015-10-24 DIAGNOSIS — N400 Enlarged prostate without lower urinary tract symptoms: Secondary | ICD-10-CM | POA: Diagnosis not present

## 2015-10-24 DIAGNOSIS — I10 Essential (primary) hypertension: Secondary | ICD-10-CM | POA: Diagnosis not present
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
## 2015-10-31 ENCOUNTER — Encounter: Payer: Self-pay | Admitting: Adult Health

## 2015-10-31 ENCOUNTER — Non-Acute Institutional Stay: Payer: Commercial Managed Care - HMO | Admitting: Adult Health

## 2015-10-31 DIAGNOSIS — R2681 Unsteadiness on feet: Secondary | ICD-10-CM | POA: Diagnosis not present

## 2015-10-31 DIAGNOSIS — N400 Enlarged prostate without lower urinary tract symptoms: Secondary | ICD-10-CM | POA: Diagnosis not present

## 2015-10-31 DIAGNOSIS — F411 Generalized anxiety disorder: Secondary | ICD-10-CM | POA: Diagnosis not present

## 2015-10-31 DIAGNOSIS — M1611 Unilateral primary osteoarthritis, right hip: Secondary | ICD-10-CM

## 2015-10-31 DIAGNOSIS — I10 Essential (primary) hypertension: Secondary | ICD-10-CM | POA: Diagnosis not present

## 2015-10-31 DIAGNOSIS — D62 Acute posthemorrhagic anemia: Secondary | ICD-10-CM

## 2015-10-31 DIAGNOSIS — R600 Localized edema: Secondary | ICD-10-CM

## 2015-10-31 DIAGNOSIS — E43 Unspecified severe protein-calorie malnutrition: Secondary | ICD-10-CM | POA: Diagnosis not present

## 2015-10-31 DIAGNOSIS — M1990 Unspecified osteoarthritis, unspecified site: Secondary | ICD-10-CM | POA: Diagnosis not present

## 2015-10-31 DIAGNOSIS — K219 Gastro-esophageal reflux disease without esophagitis: Secondary | ICD-10-CM

## 2015-12-12 DIAGNOSIS — M1611 Unilateral primary osteoarthritis, right hip: Secondary | ICD-10-CM | POA: Diagnosis not present

## 2015-12-14 DIAGNOSIS — F419 Anxiety disorder, unspecified: Secondary | ICD-10-CM | POA: Diagnosis not present

## 2015-12-14 DIAGNOSIS — Z96641 Presence of right artificial hip joint: Secondary | ICD-10-CM | POA: Diagnosis not present

## 2015-12-14 DIAGNOSIS — Z79891 Long term (current) use of opiate analgesic: Secondary | ICD-10-CM | POA: Diagnosis not present

## 2015-12-14 DIAGNOSIS — Z9181 History of falling: Secondary | ICD-10-CM | POA: Diagnosis not present

## 2015-12-14 DIAGNOSIS — M1990 Unspecified osteoarthritis, unspecified site: Secondary | ICD-10-CM | POA: Diagnosis not present

## 2015-12-14 DIAGNOSIS — Z471 Aftercare following joint replacement surgery: Secondary | ICD-10-CM | POA: Diagnosis not present

## 2015-12-14 DIAGNOSIS — Z96653 Presence of artificial knee joint, bilateral: Secondary | ICD-10-CM | POA: Diagnosis not present

## 2015-12-19 DIAGNOSIS — Z96641 Presence of right artificial hip joint: Secondary | ICD-10-CM | POA: Diagnosis not present

## 2015-12-19 DIAGNOSIS — Z79891 Long term (current) use of opiate analgesic: Secondary | ICD-10-CM | POA: Diagnosis not present

## 2015-12-19 DIAGNOSIS — Z9181 History of falling: Secondary | ICD-10-CM | POA: Diagnosis not present

## 2015-12-19 DIAGNOSIS — F419 Anxiety disorder, unspecified: Secondary | ICD-10-CM | POA: Diagnosis not present

## 2015-12-19 DIAGNOSIS — Z471 Aftercare following joint replacement surgery: Secondary | ICD-10-CM | POA: Diagnosis not present

## 2015-12-19 DIAGNOSIS — M1990 Unspecified osteoarthritis, unspecified site: Secondary | ICD-10-CM | POA: Diagnosis not present

## 2015-12-19 DIAGNOSIS — Z96653 Presence of artificial knee joint, bilateral: Secondary | ICD-10-CM | POA: Diagnosis not present

## 2015-12-26 DIAGNOSIS — Z96653 Presence of artificial knee joint, bilateral: Secondary | ICD-10-CM | POA: Diagnosis not present

## 2015-12-26 DIAGNOSIS — M1990 Unspecified osteoarthritis, unspecified site: Secondary | ICD-10-CM | POA: Diagnosis not present

## 2015-12-26 DIAGNOSIS — F419 Anxiety disorder, unspecified: Secondary | ICD-10-CM | POA: Diagnosis not present

## 2015-12-26 DIAGNOSIS — Z471 Aftercare following joint replacement surgery: Secondary | ICD-10-CM | POA: Diagnosis not present

## 2015-12-26 DIAGNOSIS — Z79891 Long term (current) use of opiate analgesic: Secondary | ICD-10-CM | POA: Diagnosis not present

## 2015-12-26 DIAGNOSIS — Z96641 Presence of right artificial hip joint: Secondary | ICD-10-CM | POA: Diagnosis not present

## 2015-12-26 DIAGNOSIS — Z9181 History of falling: Secondary | ICD-10-CM | POA: Diagnosis not present

## 2016-01-01 DIAGNOSIS — Z96653 Presence of artificial knee joint, bilateral: Secondary | ICD-10-CM | POA: Diagnosis not present

## 2016-01-01 DIAGNOSIS — Z9181 History of falling: Secondary | ICD-10-CM | POA: Diagnosis not present

## 2016-01-01 DIAGNOSIS — F419 Anxiety disorder, unspecified: Secondary | ICD-10-CM | POA: Diagnosis not present

## 2016-01-01 DIAGNOSIS — Z471 Aftercare following joint replacement surgery: Secondary | ICD-10-CM | POA: Diagnosis not present

## 2016-01-01 DIAGNOSIS — M1990 Unspecified osteoarthritis, unspecified site: Secondary | ICD-10-CM | POA: Diagnosis not present

## 2016-01-01 DIAGNOSIS — Z79891 Long term (current) use of opiate analgesic: Secondary | ICD-10-CM | POA: Diagnosis not present

## 2016-01-01 DIAGNOSIS — Z96641 Presence of right artificial hip joint: Secondary | ICD-10-CM | POA: Diagnosis not present

## 2016-01-08 DIAGNOSIS — M1990 Unspecified osteoarthritis, unspecified site: Secondary | ICD-10-CM | POA: Diagnosis not present

## 2016-01-08 DIAGNOSIS — Z9181 History of falling: Secondary | ICD-10-CM | POA: Diagnosis not present

## 2016-01-08 DIAGNOSIS — Z471 Aftercare following joint replacement surgery: Secondary | ICD-10-CM | POA: Diagnosis not present

## 2016-01-08 DIAGNOSIS — Z79891 Long term (current) use of opiate analgesic: Secondary | ICD-10-CM | POA: Diagnosis not present

## 2016-01-08 DIAGNOSIS — Z96653 Presence of artificial knee joint, bilateral: Secondary | ICD-10-CM | POA: Diagnosis not present

## 2016-01-08 DIAGNOSIS — Z96641 Presence of right artificial hip joint: Secondary | ICD-10-CM | POA: Diagnosis not present

## 2016-01-08 DIAGNOSIS — F419 Anxiety disorder, unspecified: Secondary | ICD-10-CM | POA: Diagnosis not present

## 2016-01-09 DIAGNOSIS — Z96641 Presence of right artificial hip joint: Secondary | ICD-10-CM | POA: Diagnosis not present

## 2016-01-15 DIAGNOSIS — Z96653 Presence of artificial knee joint, bilateral: Secondary | ICD-10-CM | POA: Diagnosis not present

## 2016-01-15 DIAGNOSIS — Z79891 Long term (current) use of opiate analgesic: Secondary | ICD-10-CM | POA: Diagnosis not present

## 2016-01-15 DIAGNOSIS — Z471 Aftercare following joint replacement surgery: Secondary | ICD-10-CM | POA: Diagnosis not present

## 2016-01-15 DIAGNOSIS — F419 Anxiety disorder, unspecified: Secondary | ICD-10-CM | POA: Diagnosis not present

## 2016-01-15 DIAGNOSIS — M1990 Unspecified osteoarthritis, unspecified site: Secondary | ICD-10-CM | POA: Diagnosis not present

## 2016-01-15 DIAGNOSIS — Z96641 Presence of right artificial hip joint: Secondary | ICD-10-CM | POA: Diagnosis not present

## 2016-01-15 DIAGNOSIS — Z9181 History of falling: Secondary | ICD-10-CM | POA: Diagnosis not present

## 2016-01-22 ENCOUNTER — Ambulatory Visit: Payer: PPO | Attending: Orthopedic Surgery | Admitting: Physical Therapy

## 2016-01-22 ENCOUNTER — Encounter: Payer: Self-pay | Admitting: Physical Therapy

## 2016-01-22 DIAGNOSIS — R262 Difficulty in walking, not elsewhere classified: Secondary | ICD-10-CM | POA: Diagnosis not present

## 2016-01-22 DIAGNOSIS — R5381 Other malaise: Secondary | ICD-10-CM | POA: Insufficient documentation

## 2016-01-22 DIAGNOSIS — Z9660 Presence of unspecified orthopedic joint implant: Secondary | ICD-10-CM | POA: Diagnosis not present

## 2016-01-22 DIAGNOSIS — R531 Weakness: Secondary | ICD-10-CM | POA: Diagnosis not present

## 2016-01-22 DIAGNOSIS — Z96641 Presence of right artificial hip joint: Secondary | ICD-10-CM
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
## 2016-01-24 ENCOUNTER — Ambulatory Visit: Payer: PPO | Admitting: Physical Therapy

## 2016-01-24 ENCOUNTER — Encounter: Payer: Self-pay | Admitting: Physical Therapy

## 2016-01-24 DIAGNOSIS — R262 Difficulty in walking, not elsewhere classified: Secondary | ICD-10-CM

## 2016-01-24 DIAGNOSIS — R5381 Other malaise: Secondary | ICD-10-CM

## 2016-01-24 DIAGNOSIS — R531 Weakness: Secondary | ICD-10-CM

## 2016-01-24 DIAGNOSIS — Z9660 Presence of unspecified orthopedic joint implant: Secondary | ICD-10-CM | POA: Diagnosis not present

## 2016-01-24 DIAGNOSIS — Z96641 Presence of right artificial hip joint: Secondary | ICD-10-CM
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
## 2016-01-26 ENCOUNTER — Encounter: Payer: Self-pay | Admitting: Physical Therapy

## 2016-01-26 ENCOUNTER — Ambulatory Visit: Payer: PPO | Admitting: Physical Therapy

## 2016-01-26 DIAGNOSIS — Z96641 Presence of right artificial hip joint: Secondary | ICD-10-CM

## 2016-01-26 DIAGNOSIS — R262 Difficulty in walking, not elsewhere classified: Secondary | ICD-10-CM

## 2016-01-26 DIAGNOSIS — Z9660 Presence of unspecified orthopedic joint implant: Secondary | ICD-10-CM | POA: Diagnosis not present

## 2016-01-26 DIAGNOSIS — R531 Weakness: Secondary | ICD-10-CM

## 2016-01-26 DIAGNOSIS — R5381 Other malaise: Secondary | ICD-10-CM
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
## 2016-01-29 ENCOUNTER — Ambulatory Visit: Payer: PPO | Admitting: Physical Therapy

## 2016-01-29 ENCOUNTER — Encounter: Payer: Self-pay | Admitting: Physical Therapy

## 2016-01-29 DIAGNOSIS — Z9660 Presence of unspecified orthopedic joint implant: Secondary | ICD-10-CM | POA: Diagnosis not present

## 2016-01-29 DIAGNOSIS — R5381 Other malaise: Secondary | ICD-10-CM

## 2016-01-29 DIAGNOSIS — R531 Weakness: Secondary | ICD-10-CM

## 2016-01-29 DIAGNOSIS — R262 Difficulty in walking, not elsewhere classified: Secondary | ICD-10-CM

## 2016-01-29 DIAGNOSIS — Z96641 Presence of right artificial hip joint: Secondary | ICD-10-CM
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
## 2016-01-31 ENCOUNTER — Encounter: Payer: Self-pay | Admitting: Physical Therapy

## 2016-01-31 ENCOUNTER — Ambulatory Visit: Payer: PPO | Admitting: Physical Therapy

## 2016-01-31 DIAGNOSIS — R5381 Other malaise: Secondary | ICD-10-CM

## 2016-01-31 DIAGNOSIS — Z96641 Presence of right artificial hip joint: Secondary | ICD-10-CM

## 2016-01-31 DIAGNOSIS — Z9660 Presence of unspecified orthopedic joint implant: Secondary | ICD-10-CM | POA: Diagnosis not present

## 2016-01-31 DIAGNOSIS — R531 Weakness: Secondary | ICD-10-CM

## 2016-01-31 DIAGNOSIS — R262 Difficulty in walking, not elsewhere classified: Secondary | ICD-10-CM
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
## 2016-02-02 ENCOUNTER — Encounter: Payer: Self-pay | Admitting: Physical Therapy

## 2016-02-02 ENCOUNTER — Ambulatory Visit: Payer: PPO | Admitting: Physical Therapy

## 2016-02-02 DIAGNOSIS — R531 Weakness: Secondary | ICD-10-CM

## 2016-02-02 DIAGNOSIS — R5381 Other malaise: Secondary | ICD-10-CM

## 2016-02-02 DIAGNOSIS — Z96641 Presence of right artificial hip joint: Secondary | ICD-10-CM

## 2016-02-02 DIAGNOSIS — R262 Difficulty in walking, not elsewhere classified: Secondary | ICD-10-CM

## 2016-02-02 DIAGNOSIS — Z9660 Presence of unspecified orthopedic joint implant: Secondary | ICD-10-CM | POA: Diagnosis not present
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
## 2016-02-05 ENCOUNTER — Encounter: Payer: Self-pay | Admitting: Physical Therapy

## 2016-02-05 ENCOUNTER — Ambulatory Visit: Payer: PPO | Admitting: Physical Therapy

## 2016-02-05 DIAGNOSIS — Z9660 Presence of unspecified orthopedic joint implant: Secondary | ICD-10-CM | POA: Diagnosis not present

## 2016-02-05 DIAGNOSIS — R262 Difficulty in walking, not elsewhere classified: Secondary | ICD-10-CM

## 2016-02-05 DIAGNOSIS — R531 Weakness: Secondary | ICD-10-CM

## 2016-02-05 DIAGNOSIS — R5381 Other malaise: Secondary | ICD-10-CM

## 2016-02-05 DIAGNOSIS — Z96641 Presence of right artificial hip joint: Secondary | ICD-10-CM
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
## 2016-02-07 ENCOUNTER — Ambulatory Visit: Payer: PPO | Attending: Orthopedic Surgery | Admitting: Physical Therapy

## 2016-02-07 ENCOUNTER — Encounter: Payer: Self-pay | Admitting: Physical Therapy

## 2016-02-07 DIAGNOSIS — Z96641 Presence of right artificial hip joint: Secondary | ICD-10-CM

## 2016-02-07 DIAGNOSIS — R531 Weakness: Secondary | ICD-10-CM | POA: Diagnosis not present

## 2016-02-07 DIAGNOSIS — R262 Difficulty in walking, not elsewhere classified: Secondary | ICD-10-CM | POA: Diagnosis not present

## 2016-02-07 DIAGNOSIS — Z9660 Presence of unspecified orthopedic joint implant: Secondary | ICD-10-CM | POA: Diagnosis not present

## 2016-02-07 DIAGNOSIS — R5381 Other malaise: Secondary | ICD-10-CM | POA: Insufficient documentation
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
## 2016-02-09 ENCOUNTER — Encounter: Payer: Self-pay | Admitting: Physical Therapy

## 2016-02-09 ENCOUNTER — Ambulatory Visit: Payer: PPO | Admitting: Physical Therapy

## 2016-02-09 DIAGNOSIS — R531 Weakness: Secondary | ICD-10-CM

## 2016-02-09 DIAGNOSIS — R262 Difficulty in walking, not elsewhere classified: Secondary | ICD-10-CM

## 2016-02-09 DIAGNOSIS — Z9660 Presence of unspecified orthopedic joint implant: Secondary | ICD-10-CM | POA: Diagnosis not present

## 2016-02-09 DIAGNOSIS — R5381 Other malaise: Secondary | ICD-10-CM

## 2016-02-09 DIAGNOSIS — Z96641 Presence of right artificial hip joint: Secondary | ICD-10-CM
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
## 2016-02-09 NOTE — Patient Instructions (Signed)
Cross friction massage on scar.

## 2016-02-12 ENCOUNTER — Encounter: Payer: Self-pay | Admitting: Physical Therapy

## 2016-02-12 ENCOUNTER — Ambulatory Visit: Payer: PPO | Admitting: Physical Therapy

## 2016-02-12 DIAGNOSIS — Z9660 Presence of unspecified orthopedic joint implant: Secondary | ICD-10-CM | POA: Diagnosis not present

## 2016-02-12 DIAGNOSIS — R5381 Other malaise: Secondary | ICD-10-CM

## 2016-02-12 DIAGNOSIS — R531 Weakness: Secondary | ICD-10-CM

## 2016-02-12 DIAGNOSIS — Z96641 Presence of right artificial hip joint: Secondary | ICD-10-CM

## 2016-02-12 DIAGNOSIS — R262 Difficulty in walking, not elsewhere classified: Secondary | ICD-10-CM
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
## 2016-02-14 ENCOUNTER — Ambulatory Visit: Payer: PPO | Admitting: Physical Therapy

## 2016-02-14 ENCOUNTER — Encounter: Payer: Self-pay | Admitting: Physical Therapy

## 2016-02-14 DIAGNOSIS — R262 Difficulty in walking, not elsewhere classified: Secondary | ICD-10-CM

## 2016-02-14 DIAGNOSIS — R5381 Other malaise: Secondary | ICD-10-CM

## 2016-02-14 DIAGNOSIS — R531 Weakness: Secondary | ICD-10-CM

## 2016-02-14 DIAGNOSIS — Z9660 Presence of unspecified orthopedic joint implant: Secondary | ICD-10-CM | POA: Diagnosis not present

## 2016-02-14 DIAGNOSIS — Z96641 Presence of right artificial hip joint: Secondary | ICD-10-CM
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
## 2016-02-16 ENCOUNTER — Ambulatory Visit: Payer: PPO | Admitting: Physical Therapy

## 2016-02-16 ENCOUNTER — Encounter: Payer: Self-pay | Admitting: Physical Therapy

## 2016-02-16 DIAGNOSIS — Z96641 Presence of right artificial hip joint: Secondary | ICD-10-CM

## 2016-02-16 DIAGNOSIS — R5381 Other malaise: Secondary | ICD-10-CM

## 2016-02-16 DIAGNOSIS — R262 Difficulty in walking, not elsewhere classified: Secondary | ICD-10-CM

## 2016-02-16 DIAGNOSIS — Z9660 Presence of unspecified orthopedic joint implant: Secondary | ICD-10-CM | POA: Diagnosis not present

## 2016-02-16 DIAGNOSIS — R531 Weakness: Secondary | ICD-10-CM
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
## 2016-02-19 ENCOUNTER — Encounter: Payer: Self-pay | Admitting: Physical Therapy

## 2016-02-19 ENCOUNTER — Ambulatory Visit: Payer: PPO | Admitting: Physical Therapy

## 2016-02-19 DIAGNOSIS — Z9660 Presence of unspecified orthopedic joint implant: Secondary | ICD-10-CM | POA: Diagnosis not present

## 2016-02-19 DIAGNOSIS — Z96641 Presence of right artificial hip joint: Secondary | ICD-10-CM

## 2016-02-19 DIAGNOSIS — R262 Difficulty in walking, not elsewhere classified: Secondary | ICD-10-CM

## 2016-02-19 DIAGNOSIS — R531 Weakness: Secondary | ICD-10-CM

## 2016-02-19 DIAGNOSIS — R5381 Other malaise: Secondary | ICD-10-CM
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
## 2016-02-20 DIAGNOSIS — Z96641 Presence of right artificial hip joint: Secondary | ICD-10-CM | POA: Diagnosis not present

## 2016-02-21 ENCOUNTER — Ambulatory Visit: Payer: PPO | Admitting: Physical Therapy

## 2016-02-21 ENCOUNTER — Encounter: Payer: Self-pay | Admitting: Physical Therapy

## 2016-02-21 DIAGNOSIS — Z96641 Presence of right artificial hip joint: Secondary | ICD-10-CM

## 2016-02-21 DIAGNOSIS — R262 Difficulty in walking, not elsewhere classified: Secondary | ICD-10-CM

## 2016-02-21 DIAGNOSIS — Z9660 Presence of unspecified orthopedic joint implant: Secondary | ICD-10-CM | POA: Diagnosis not present

## 2016-02-21 DIAGNOSIS — R5381 Other malaise: Secondary | ICD-10-CM

## 2016-02-21 DIAGNOSIS — R531 Weakness: Secondary | ICD-10-CM
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
## 2016-02-23 ENCOUNTER — Encounter: Payer: Self-pay | Admitting: Physical Therapy

## 2016-02-23 ENCOUNTER — Ambulatory Visit: Payer: PPO | Admitting: Physical Therapy

## 2016-02-23 DIAGNOSIS — R262 Difficulty in walking, not elsewhere classified: Secondary | ICD-10-CM

## 2016-02-23 DIAGNOSIS — Z96641 Presence of right artificial hip joint: Secondary | ICD-10-CM

## 2016-02-23 DIAGNOSIS — R5381 Other malaise: Secondary | ICD-10-CM

## 2016-02-23 DIAGNOSIS — Z9660 Presence of unspecified orthopedic joint implant: Secondary | ICD-10-CM | POA: Diagnosis not present

## 2016-02-23 DIAGNOSIS — R531 Weakness: Secondary | ICD-10-CM
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
## 2016-02-26 ENCOUNTER — Ambulatory Visit: Payer: PPO | Admitting: Physical Therapy

## 2016-02-26 ENCOUNTER — Encounter: Payer: Self-pay | Admitting: Physical Therapy

## 2016-02-26 DIAGNOSIS — R531 Weakness: Secondary | ICD-10-CM

## 2016-02-26 DIAGNOSIS — Z96641 Presence of right artificial hip joint: Secondary | ICD-10-CM

## 2016-02-26 DIAGNOSIS — Z9660 Presence of unspecified orthopedic joint implant: Secondary | ICD-10-CM | POA: Diagnosis not present

## 2016-02-26 DIAGNOSIS — R5381 Other malaise: Secondary | ICD-10-CM

## 2016-02-26 DIAGNOSIS — R262 Difficulty in walking, not elsewhere classified: Secondary | ICD-10-CM
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
## 2016-02-28 ENCOUNTER — Ambulatory Visit: Payer: PPO | Admitting: Physical Therapy

## 2016-02-28 ENCOUNTER — Encounter: Payer: Self-pay | Admitting: Physical Therapy

## 2016-02-28 DIAGNOSIS — Z9660 Presence of unspecified orthopedic joint implant: Secondary | ICD-10-CM | POA: Diagnosis not present

## 2016-02-28 DIAGNOSIS — Z96641 Presence of right artificial hip joint: Secondary | ICD-10-CM

## 2016-02-28 DIAGNOSIS — R262 Difficulty in walking, not elsewhere classified: Secondary | ICD-10-CM

## 2016-02-28 DIAGNOSIS — R531 Weakness: Secondary | ICD-10-CM

## 2016-02-28 DIAGNOSIS — R5381 Other malaise: Secondary | ICD-10-CM
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
## 2016-03-01 ENCOUNTER — Ambulatory Visit: Payer: PPO | Admitting: Physical Therapy

## 2016-03-01 ENCOUNTER — Encounter: Payer: Self-pay | Admitting: Physical Therapy

## 2016-03-01 DIAGNOSIS — Z96641 Presence of right artificial hip joint: Secondary | ICD-10-CM

## 2016-03-01 DIAGNOSIS — R5381 Other malaise: Secondary | ICD-10-CM

## 2016-03-01 DIAGNOSIS — R531 Weakness: Secondary | ICD-10-CM

## 2016-03-01 DIAGNOSIS — R262 Difficulty in walking, not elsewhere classified: Secondary | ICD-10-CM

## 2016-03-01 DIAGNOSIS — Z9660 Presence of unspecified orthopedic joint implant: Secondary | ICD-10-CM | POA: Diagnosis not present
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
## 2016-03-04 ENCOUNTER — Encounter: Payer: Self-pay | Admitting: Physical Therapy

## 2016-03-04 ENCOUNTER — Ambulatory Visit: Payer: PPO | Admitting: Physical Therapy

## 2016-03-04 DIAGNOSIS — Z9660 Presence of unspecified orthopedic joint implant: Secondary | ICD-10-CM | POA: Diagnosis not present

## 2016-03-04 DIAGNOSIS — R262 Difficulty in walking, not elsewhere classified: Secondary | ICD-10-CM

## 2016-03-04 DIAGNOSIS — R5381 Other malaise: Secondary | ICD-10-CM

## 2016-03-04 DIAGNOSIS — R531 Weakness: Secondary | ICD-10-CM

## 2016-03-04 DIAGNOSIS — Z96641 Presence of right artificial hip joint: Secondary | ICD-10-CM
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
## 2016-03-06 ENCOUNTER — Encounter: Payer: Self-pay | Admitting: Physical Therapy

## 2016-03-06 ENCOUNTER — Ambulatory Visit: Payer: PPO | Admitting: Physical Therapy

## 2016-03-06 DIAGNOSIS — R262 Difficulty in walking, not elsewhere classified: Secondary | ICD-10-CM

## 2016-03-06 DIAGNOSIS — Z9660 Presence of unspecified orthopedic joint implant: Secondary | ICD-10-CM | POA: Diagnosis not present

## 2016-03-06 DIAGNOSIS — R531 Weakness: Secondary | ICD-10-CM

## 2016-03-06 DIAGNOSIS — Z96641 Presence of right artificial hip joint: Secondary | ICD-10-CM

## 2016-03-06 DIAGNOSIS — R5381 Other malaise: Secondary | ICD-10-CM
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
## 2016-03-08 ENCOUNTER — Ambulatory Visit: Payer: PPO | Admitting: Physical Therapy

## 2016-03-08 ENCOUNTER — Encounter: Payer: Self-pay | Admitting: Physical Therapy

## 2016-03-08 DIAGNOSIS — R5381 Other malaise: Secondary | ICD-10-CM

## 2016-03-08 DIAGNOSIS — Z9660 Presence of unspecified orthopedic joint implant: Secondary | ICD-10-CM | POA: Diagnosis not present

## 2016-03-08 DIAGNOSIS — Z96641 Presence of right artificial hip joint: Secondary | ICD-10-CM

## 2016-03-08 DIAGNOSIS — R531 Weakness: Secondary | ICD-10-CM

## 2016-03-08 DIAGNOSIS — R262 Difficulty in walking, not elsewhere classified: Secondary | ICD-10-CM
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
## 2016-03-09 DIAGNOSIS — Z22322 Carrier or suspected carrier of Methicillin resistant Staphylococcus aureus: Secondary | ICD-10-CM

## 2016-03-09 HISTORY — DX: Carrier or suspected carrier of methicillin resistant Staphylococcus aureus: Z22.322

## 2016-03-11 ENCOUNTER — Ambulatory Visit: Payer: PPO | Attending: Orthopedic Surgery | Admitting: Physical Therapy

## 2016-03-11 ENCOUNTER — Encounter: Payer: Self-pay | Admitting: Physical Therapy

## 2016-03-11 DIAGNOSIS — M6281 Muscle weakness (generalized): Secondary | ICD-10-CM | POA: Diagnosis not present

## 2016-03-11 DIAGNOSIS — R5381 Other malaise: Secondary | ICD-10-CM | POA: Diagnosis not present

## 2016-03-11 DIAGNOSIS — Z9660 Presence of unspecified orthopedic joint implant: Secondary | ICD-10-CM | POA: Insufficient documentation

## 2016-03-11 DIAGNOSIS — R531 Weakness: Secondary | ICD-10-CM | POA: Diagnosis not present

## 2016-03-11 DIAGNOSIS — R262 Difficulty in walking, not elsewhere classified: Secondary | ICD-10-CM | POA: Diagnosis not present
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
## 2016-03-13 ENCOUNTER — Encounter: Payer: Self-pay | Admitting: Physical Therapy

## 2016-03-13 ENCOUNTER — Ambulatory Visit: Payer: PPO | Admitting: Physical Therapy

## 2016-03-13 DIAGNOSIS — R5381 Other malaise: Secondary | ICD-10-CM

## 2016-03-13 DIAGNOSIS — R531 Weakness: Secondary | ICD-10-CM

## 2016-03-13 DIAGNOSIS — R262 Difficulty in walking, not elsewhere classified: Secondary | ICD-10-CM | POA: Diagnosis not present

## 2016-03-13 DIAGNOSIS — Z96641 Presence of right artificial hip joint: Secondary | ICD-10-CM
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
## 2016-03-15 ENCOUNTER — Ambulatory Visit: Payer: PPO | Admitting: Physical Therapy

## 2016-03-15 NOTE — Addendum Note (Signed)
Addended by: Sumner Boast on: 03/15/2016 10:03 AM   Modules accepted: Orders

## 2016-03-18 ENCOUNTER — Ambulatory Visit: Payer: PPO | Admitting: Physical Therapy

## 2016-03-18 ENCOUNTER — Encounter: Payer: Self-pay | Admitting: Physical Therapy

## 2016-03-18 DIAGNOSIS — R262 Difficulty in walking, not elsewhere classified: Secondary | ICD-10-CM | POA: Diagnosis not present

## 2016-03-18 DIAGNOSIS — M6281 Muscle weakness (generalized): Secondary | ICD-10-CM
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
## 2016-03-19 DIAGNOSIS — Z96641 Presence of right artificial hip joint: Secondary | ICD-10-CM | POA: Diagnosis not present

## 2016-03-20 ENCOUNTER — Ambulatory Visit: Payer: PPO | Admitting: Physical Therapy

## 2016-03-22 ENCOUNTER — Ambulatory Visit: Payer: PPO | Admitting: Physical Therapy

## 2016-03-25 ENCOUNTER — Encounter: Payer: Self-pay | Admitting: Physical Therapy

## 2016-03-25 ENCOUNTER — Ambulatory Visit: Payer: PPO | Admitting: Physical Therapy

## 2016-03-25 DIAGNOSIS — M6281 Muscle weakness (generalized): Secondary | ICD-10-CM

## 2016-03-25 DIAGNOSIS — R262 Difficulty in walking, not elsewhere classified: Secondary | ICD-10-CM | POA: Diagnosis not present
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
## 2016-03-27 ENCOUNTER — Ambulatory Visit: Payer: PPO | Admitting: Physical Therapy

## 2016-03-29 ENCOUNTER — Ambulatory Visit: Payer: PPO | Admitting: Physical Therapy

## 2016-03-31 ENCOUNTER — Emergency Department (HOSPITAL_COMMUNITY): Payer: PPO

## 2016-03-31 ENCOUNTER — Inpatient Hospital Stay (HOSPITAL_COMMUNITY)
Admission: EM | Admit: 2016-03-31 | Discharge: 2016-04-05 | DRG: 500 | Disposition: A | Discharge status: Skilled Nursing Facility | Payer: PPO | Attending: Internal Medicine | Admitting: Internal Medicine

## 2016-03-31 DIAGNOSIS — L02415 Cutaneous abscess of right lower limb: Secondary | ICD-10-CM | POA: Diagnosis not present

## 2016-03-31 DIAGNOSIS — Y831 Surgical operation with implant of artificial internal device as the cause of abnormal reaction of the patient, or of later complication, without mention of misadventure at the time of the procedure: Secondary | ICD-10-CM | POA: Diagnosis present

## 2016-03-31 DIAGNOSIS — Y838 Other surgical procedures as the cause of abnormal reaction of the patient, or of later complication, without mention of misadventure at the time of the procedure: Secondary | ICD-10-CM | POA: Diagnosis not present

## 2016-03-31 DIAGNOSIS — M109 Gout, unspecified: Secondary | ICD-10-CM | POA: Diagnosis not present

## 2016-03-31 DIAGNOSIS — N400 Enlarged prostate without lower urinary tract symptoms: Secondary | ICD-10-CM | POA: Diagnosis not present

## 2016-03-31 DIAGNOSIS — Z9181 History of falling: Secondary | ICD-10-CM

## 2016-03-31 DIAGNOSIS — T84030A Mechanical loosening of internal right hip prosthetic joint, initial encounter: Secondary | ICD-10-CM | POA: Diagnosis not present

## 2016-03-31 DIAGNOSIS — M1990 Unspecified osteoarthritis, unspecified site: Secondary | ICD-10-CM | POA: Diagnosis present

## 2016-03-31 DIAGNOSIS — F1020 Alcohol dependence, uncomplicated: Secondary | ICD-10-CM | POA: Diagnosis not present

## 2016-03-31 DIAGNOSIS — F1010 Alcohol abuse, uncomplicated: Secondary | ICD-10-CM | POA: Insufficient documentation

## 2016-03-31 DIAGNOSIS — M00051 Staphylococcal arthritis, right hip: Secondary | ICD-10-CM | POA: Diagnosis not present

## 2016-03-31 DIAGNOSIS — F17290 Nicotine dependence, other tobacco product, uncomplicated: Secondary | ICD-10-CM | POA: Diagnosis not present

## 2016-03-31 DIAGNOSIS — W109XXA Fall (on) (from) unspecified stairs and steps, initial encounter: Secondary | ICD-10-CM | POA: Diagnosis not present

## 2016-03-31 DIAGNOSIS — M25559 Pain in unspecified hip: Secondary | ICD-10-CM

## 2016-03-31 DIAGNOSIS — W19XXXA Unspecified fall, initial encounter: Secondary | ICD-10-CM | POA: Insufficient documentation

## 2016-03-31 DIAGNOSIS — Z951 Presence of aortocoronary bypass graft: Secondary | ICD-10-CM | POA: Diagnosis not present

## 2016-03-31 DIAGNOSIS — K219 Gastro-esophageal reflux disease without esophagitis: Secondary | ICD-10-CM | POA: Diagnosis present

## 2016-03-31 DIAGNOSIS — M25551 Pain in right hip: Secondary | ICD-10-CM | POA: Diagnosis present

## 2016-03-31 DIAGNOSIS — T8459XA Infection and inflammatory reaction due to other internal joint prosthesis, initial encounter: Secondary | ICD-10-CM | POA: Insufficient documentation

## 2016-03-31 DIAGNOSIS — R262 Difficulty in walking, not elsewhere classified: Secondary | ICD-10-CM | POA: Diagnosis not present

## 2016-03-31 DIAGNOSIS — F419 Anxiety disorder, unspecified: Secondary | ICD-10-CM | POA: Diagnosis present

## 2016-03-31 DIAGNOSIS — B9562 Methicillin resistant Staphylococcus aureus infection as the cause of diseases classified elsewhere: Secondary | ICD-10-CM | POA: Diagnosis not present

## 2016-03-31 DIAGNOSIS — F329 Major depressive disorder, single episode, unspecified: Secondary | ICD-10-CM | POA: Diagnosis not present

## 2016-03-31 DIAGNOSIS — K6812 Psoas muscle abscess: Secondary | ICD-10-CM | POA: Diagnosis present

## 2016-03-31 DIAGNOSIS — T8451XA Infection and inflammatory reaction due to internal right hip prosthesis, initial encounter: Principal | ICD-10-CM | POA: Diagnosis present

## 2016-03-31 DIAGNOSIS — R296 Repeated falls: Secondary | ICD-10-CM | POA: Diagnosis present

## 2016-03-31 DIAGNOSIS — Z471 Aftercare following joint replacement surgery: Secondary | ICD-10-CM | POA: Diagnosis not present

## 2016-03-31 DIAGNOSIS — S7001XA Contusion of right hip, initial encounter: Secondary | ICD-10-CM | POA: Diagnosis present

## 2016-03-31 DIAGNOSIS — Z96649 Presence of unspecified artificial hip joint: Secondary | ICD-10-CM

## 2016-03-31 DIAGNOSIS — Z5189 Encounter for other specified aftercare: Secondary | ICD-10-CM | POA: Diagnosis not present

## 2016-03-31 DIAGNOSIS — I872 Venous insufficiency (chronic) (peripheral): Secondary | ICD-10-CM | POA: Diagnosis present

## 2016-03-31 DIAGNOSIS — A4901 Methicillin susceptible Staphylococcus aureus infection, unspecified site: Secondary | ICD-10-CM | POA: Insufficient documentation

## 2016-03-31 DIAGNOSIS — M6281 Muscle weakness (generalized): Secondary | ICD-10-CM | POA: Diagnosis not present

## 2016-03-31 DIAGNOSIS — B9561 Methicillin susceptible Staphylococcus aureus infection as the cause of diseases classified elsewhere: Secondary | ICD-10-CM | POA: Diagnosis not present

## 2016-03-31 DIAGNOSIS — T148 Other injury of unspecified body region: Secondary | ICD-10-CM | POA: Diagnosis not present

## 2016-03-31 DIAGNOSIS — Z96641 Presence of right artificial hip joint: Secondary | ICD-10-CM | POA: Diagnosis not present

## 2016-03-31 DIAGNOSIS — K651 Peritoneal abscess: Secondary | ICD-10-CM | POA: Diagnosis not present

## 2016-03-31 LAB — BASIC METABOLIC PANEL
Anion gap: 16 — ABNORMAL HIGH (ref 5–15)
BUN: 7 mg/dL (ref 6–20)
CHLORIDE: 91 mmol/L — AB (ref 101–111)
CO2: 25 mmol/L (ref 22–32)
Calcium: 9 mg/dL (ref 8.9–10.3)
Creatinine, Ser: 0.83 mg/dL (ref 0.61–1.24)
GFR calc Af Amer: >60 mL/min (ref >60)
GFR calc non Af Amer: >60 mL/min (ref >60)
Glucose, Bld: 98 mg/dL (ref 65–99)
POTASSIUM: 3.3 mmol/L — AB (ref 3.5–5.1)
SODIUM: 132 mmol/L — AB (ref 135–145)

## 2016-03-31 LAB — CBC WITH DIFFERENTIAL/PLATELET
Basophils Absolute: 0 10*3/uL (ref 0.0–0.1)
Basophils Relative: 0 %
EOS ABS: 0 10*3/uL (ref 0.0–0.7)
Eosinophils Relative: 1 %
HCT: 36.6 % — ABNORMAL LOW (ref 39.0–52.0)
HEMOGLOBIN: 12.1 g/dL — AB (ref 13.0–17.0)
LYMPHS ABS: 1.7 10*3/uL (ref 0.7–4.0)
LYMPHS PCT: 21 %
MCH: 31.3 pg (ref 26.0–34.0)
MCHC: 33.1 g/dL (ref 30.0–36.0)
MCV: 94.6 fL (ref 78.0–100.0)
Monocytes Absolute: 1.2 10*3/uL — ABNORMAL HIGH (ref 0.1–1.0)
Monocytes Relative: 15 %
NEUTROS PCT: 63 %
Neutro Abs: 5.3 10*3/uL (ref 1.7–7.7)
Platelets: 281 10*3/uL (ref 150–400)
RBC: 3.87 MIL/uL — AB (ref 4.22–5.81)
RDW: 15.4 % (ref 11.5–15.5)
WBC: 8.2 10*3/uL (ref 4.0–10.5)

## 2016-03-31 MED ORDER — TAMSULOSIN HCL 0.4 MG PO CAPS
0.4000 mg | ORAL_CAPSULE | Freq: Every day | ORAL | Status: DC
  Administered 2016-03-31 – 2016-04-04 (×5): 0.4 mg via ORAL
  Filled 2016-03-31 (×5): qty 1

## 2016-03-31 MED ORDER — LORAZEPAM 1 MG PO TABS: 1.0000 mg | ORAL_TABLET | Freq: Four times a day (QID) | ORAL | Status: AC | PRN

## 2016-03-31 MED ORDER — HYDROMORPHONE HCL 1 MG/ML IJ SOLN
1.0000 mg | Freq: Once | INTRAMUSCULAR | Status: AC
  Administered 2016-03-31: 1 mg via INTRAVENOUS
  Filled 2016-03-31: qty 1

## 2016-03-31 MED ORDER — THIAMINE HCL 100 MG/ML IJ SOLN
Freq: Once | INTRAVENOUS | Status: AC
  Administered 2016-03-31: 19:00:00 via INTRAVENOUS
  Filled 2016-03-31: qty 1000

## 2016-03-31 MED ORDER — HYDROCODONE-ACETAMINOPHEN 10-325 MG PO TABS
1.0000 | ORAL_TABLET | Freq: Four times a day (QID) | ORAL | Status: DC | PRN
  Administered 2016-03-31 – 2016-04-01 (×5): 1 via ORAL
  Filled 2016-03-31 (×5): qty 1

## 2016-03-31 MED ORDER — ENOXAPARIN SODIUM 40 MG/0.4ML ~~LOC~~ SOLN
40.0000 mg | SUBCUTANEOUS | Status: DC
  Administered 2016-03-31: 40 mg via SUBCUTANEOUS
  Filled 2016-03-31: qty 0.4

## 2016-03-31 MED ORDER — POLYETHYLENE GLYCOL 3350 17 G PO PACK
17.0000 g | PACK | Freq: Every day | ORAL | Status: DC
  Administered 2016-04-01 – 2016-04-05 (×4): 17 g via ORAL
  Filled 2016-03-31 (×5): qty 1

## 2016-03-31 MED ORDER — LORAZEPAM 2 MG/ML IJ SOLN: 1.0000 mg | Freq: Four times a day (QID) | INTRAMUSCULAR | Status: AC | PRN

## 2016-03-31 MED ORDER — HYDROCODONE-ACETAMINOPHEN 10-325 MG PO TABS: 1.0000 | ORAL_TABLET | Freq: Four times a day (QID) | ORAL | Status: DC | PRN

## 2016-03-31 NOTE — ED Notes (Addendum)
PT presents from home via [facility] with c/o right hip pain s/p falling 6 days ago.  Right hip replacement Nov 2016.  RLE is shortened and externally rotated. Pt is A&O x4. +Pedal Pulse, bilateral pitting edema (takes Lasix). Give 240mcg Fentanyl PTA without relief of pain

## 2016-03-31 NOTE — Care Management Note (Signed)
Case Management Note  Patient Details  Name: Jesus Daniel MRN: [id number] Date of Birth: Feb 24, 1946  Subjective/Objective:   71 y.o. M seen in the ED today for evaluation ongoing R hip pain. S/p R THA 10/2015 with subsequent STSNF stay, HHPT and most recent Outpatient PT follow-up. Patient reports he has increased pain and is no longer able to function at home alone. Desires return to ST-SNF for rehab. CM consulted by MD for availability of Rehab vs HH options. Pt has all DME. Upon interview patient under the impression he should be recovering at a much faster rate? Pain not improving. No acute fractures or reason for inpatient admission. Discussed Intractable pain issues with MD. Pt will also need PT/OT eval for skilled need. Jodie, CSW made aware of placement needs and  [name] will be a priority for 04/01/2016. (Will be included in written handoff for am staff).                    Action/Plan: Referred to CSW for SNF placement. CM will continue to follow for additional disposition discharge needs as they arise.     Expected Discharge Date:                  Expected Discharge Plan:  Skilled Nursing Facility  In-House Referral:  Clinical Social Work  Discharge planning Services  CM Consult  Post Acute Care Choice:    Choice offered to:  Patient  DME Arranged:    DME Agency:     HH Arranged:    [place] Agency:     Status of Service:  In process, will continue to follow  Medicare Important Message Given:    Date Medicare IM Given:    Medicare IM give by:    Date Additional Medicare IM Given:    Additional Medicare Important Message give by:     If discussed at [place] of Stay Meetings, dates discussed:    Additional Comments:  Delrae Sawyers, RN 03/31/2016, 4:09 PM
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
## 2016-03-31 NOTE — ED Notes (Signed)
Pt states that he lives alone and does not feel safe going home in his condition and recent hx of falls. MD made aware - social work to come see.

## 2016-03-31 NOTE — ED Notes (Signed)
Attempted to get patient up to ambulate - even with 2 person assist, pt had trouble swinging legs over side of bed and began shaking, reporting worsening pain just sitting up. Attempted to stand with walker, but pt states he can not stand. MD made aware.
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
## 2016-03-31 NOTE — Progress Notes (Signed)
ED report received at 1713 and pt arrived to the unit via stretcher with belongings to the side at 1800. Pt A&O x4; pt oriented to the unit and room; VSS; MD paged and notified of pt arrival to the unit. Pt sacrum intact except slight raised purple spot found between buttocks and thigh and pt report he had a cyst which was taken out but spot grew inwards and caused that spot. Pink foam applied to site. Skin assessed with second RN Roland Rack. Pt in bed with call light within reach. Will closely monitor pt. Delia Heady RN

## 2016-04-01 ENCOUNTER — Observation Stay (HOSPITAL_COMMUNITY): Payer: PPO

## 2016-04-01 ENCOUNTER — Ambulatory Visit: Payer: PPO | Admitting: Physical Therapy

## 2016-04-01 DIAGNOSIS — B9561 Methicillin susceptible Staphylococcus aureus infection as the cause of diseases classified elsewhere: Secondary | ICD-10-CM | POA: Diagnosis not present

## 2016-04-01 DIAGNOSIS — F1010 Alcohol abuse, uncomplicated: Secondary | ICD-10-CM

## 2016-04-01 DIAGNOSIS — R296 Repeated falls: Secondary | ICD-10-CM | POA: Diagnosis not present

## 2016-04-01 DIAGNOSIS — Z96641 Presence of right artificial hip joint: Secondary | ICD-10-CM

## 2016-04-01 DIAGNOSIS — Z9181 History of falling: Secondary | ICD-10-CM | POA: Diagnosis not present

## 2016-04-01 DIAGNOSIS — M25551 Pain in right hip: Secondary | ICD-10-CM | POA: Diagnosis not present

## 2016-04-01 DIAGNOSIS — L02415 Cutaneous abscess of right lower limb: Secondary | ICD-10-CM | POA: Diagnosis not present

## 2016-04-01 DIAGNOSIS — B9562 Methicillin resistant Staphylococcus aureus infection as the cause of diseases classified elsewhere: Secondary | ICD-10-CM | POA: Diagnosis not present

## 2016-04-01 LAB — MRSA PCR SCREENING: MRSA by PCR: NEGATIVE

## 2016-04-01 LAB — MAGNESIUM: MAGNESIUM: 1.3 mg/dL — AB (ref 1.7–2.4)

## 2016-04-01 MED ORDER — MAGNESIUM SULFATE 4 GM/100ML IV SOLN
4.0000 g | Freq: Once | INTRAVENOUS | Status: AC
  Administered 2016-04-01: 4 g via INTRAVENOUS
  Filled 2016-04-01: qty 100

## 2016-04-01 MED ORDER — HYDROMORPHONE HCL 2 MG PO TABS
2.0000 mg | ORAL_TABLET | ORAL | Status: DC | PRN
  Administered 2016-04-01: 2 mg via ORAL
  Filled 2016-04-01: qty 1

## 2016-04-01 MED ORDER — POTASSIUM CHLORIDE CRYS ER 20 MEQ PO TBCR
40.0000 meq | EXTENDED_RELEASE_TABLET | Freq: Once | ORAL | Status: AC
  Administered 2016-04-01: 40 meq via ORAL
  Filled 2016-04-01: qty 2

## 2016-04-01 MED ORDER — HYDROMORPHONE HCL 1 MG/ML IJ SOLN
1.0000 mg | INTRAMUSCULAR | Status: DC | PRN
  Administered 2016-04-01 – 2016-04-02 (×5): 1 mg via INTRAVENOUS
  Filled 2016-04-01 (×5): qty 1

## 2016-04-01 MED ORDER — POTASSIUM CHLORIDE CRYS ER 20 MEQ PO TBCR
40.0000 meq | EXTENDED_RELEASE_TABLET | Freq: Once | ORAL | Status: AC
  Administered 2016-04-01: 40 meq via ORAL

## 2016-04-01 NOTE — NC FL2 (Signed)
  [place] LEVEL OF CARE SCREENING TOOL     IDENTIFICATION  Patient Name: Jesus Daniel Birthdate: Jun 12, 1946 Sex: male Admission Date (Current Location): 03/31/2016  [facility] and [place] Number:  Herbalist and Address:  [facility], [place] [address], [place], [place] [address]      Provider Number: [id number]  Attending Physician Name and Address:  [name], MD  Relative Name and Phone Number:       Current Level of Care: Hospital Recommended Level of Care: [place] Prior Approval Number:    Date Approved/Denied:   PASRR Number:    Discharge Plan: SNF    Current Diagnoses: Patient Active Problem List   Diagnosis Date Noted  . Right hip pain 03/31/2016  . Arthritis of right hip 10/24/2015  . Edema 10/23/2015  . Primary localized osteoarthrosis of pelvic region 10/18/2015  . DJD (degenerative joint disease) 10/17/2015  . Anxiety 10/17/2015  . Depression 10/17/2015  . GERD (gastroesophageal reflux disease) 10/17/2015  . Arthritis 10/17/2015  . Melanoma of skin ([place]) 10/17/2015  . HTN (hypertension) 04/05/2012    Orientation RESPIRATION BLADDER Height & Weight     Self, Time, Situation, Place  Normal Continent Weight: 220 lb (99.791 kg) Height:  5\' 9"  (175.3 cm)  BEHAVIORAL SYMPTOMS/MOOD NEUROLOGICAL BOWEL NUTRITION STATUS      Continent Diet (Please see discharge summary.)  AMBULATORY STATUS COMMUNICATION OF NEEDS Skin   Limited Assist Verbally Normal                       Personal Care Assistance Level of Assistance  Bathing, Feeding, Dressing Bathing Assistance: Limited assistance Feeding assistance: Limited assistance Dressing Assistance: Limited assistance     Functional Limitations Info             SPECIAL CARE FACTORS FREQUENCY  PT (By licensed PT), OT (By licensed OT)     PT Frequency: 5 OT Frequency: 5            Contractures      Additional Factors Info   Code Status, Allergies, Isolation Precautions Code Status Info: FULL Allergies Info: No known allergies     Isolation Precautions Info: MRSA     Current Medications (04/01/2016):  This is the current hospital active medication list Current Facility-Administered Medications  Medication Dose Route Frequency Provider Last Rate Last Dose  . HYDROcodone-acetaminophen (NORCO) 10-325 MG per tablet 1 tablet  1 tablet Oral Q6H PRN Riccardo Dubin, MD   1 tablet at 04/01/16 1013  . HYDROmorphone (DILAUDID) injection 1 mg  1 mg Intravenous Q2H PRN Riccardo Dubin, MD   1 mg at 04/01/16 1444  . LORazepam (ATIVAN) tablet 1 mg  1 mg Oral Q6H PRN Rushil Sherrye Payor, MD       Or  . LORazepam (ATIVAN) injection 1 mg  1 mg Intravenous Q6H PRN Rushil Patel V, MD      . polyethylene glycol (MIRALAX / GLYCOLAX) packet 17 g  17 g Oral Daily Rushil Sherrye Payor, MD   17 g at 04/01/16 0754  . tamsulosin (FLOMAX) capsule 0.4 mg  0.4 mg Oral QPC supper Rushil Sherrye Payor, MD   0.4 mg at 03/31/16 1844     Discharge Medications: Please see discharge summary for a list of discharge medications.  Relevant Imaging Results:  Relevant Lab Results:   Additional Information SSN: [id number]  Caroline Sauger, LCSW

## 2016-04-01 NOTE — Care Management Obs Status (Signed)
[place] NOTIFICATION   Patient Details  Name: Jesus Daniel MRN: [id number] Date of Birth: 1946-07-22   Medicare Observation Status Notification Given:  Yes    Ninfa Meeker, RN 04/01/2016, 10:38 AM

## 2016-04-01 NOTE — Clinical Social Work Note (Signed)
[facility]: [id number] Jesus Daniel, [place] [facility]: [phone] Surgical: [phone]

## 2016-04-01 NOTE — Evaluation (Signed)
Physical Therapy Evaluation Patient Details Name: Jesus Daniel MRN: [id number] DOB: 02/07/1946 Today's Date: 04/01/2016   History of Present Illness  70 yo admitted with Rt hip pain after 2 recent falls. PMHx: Rt Anterior THA Nov 2016, ETOH, GERD, depression, anxiety, skin CA  Clinical Impression  Pt willing to work with therapy. Pt terse at beginning of session, but apologetic towards end of session. Pt reported having 5 falls in the last six months. Pt able to mobilize and ambulate at min guard level for safety and management of lines, but required assistance for balance during transfers. Pt reported that he would've been able to ambulate further if he was wearing shoes. Pt has decreased strength, balance, and activity tolerance, and would benefit from therapy to maximize his independence and decrease caregiver burden. Discussed follow up recommendations with pt, and pt was agreeable to recommendation.    Follow Up Recommendations SNF;Supervision for mobility/OOB    Equipment Recommendations  None recommended by PT    Recommendations for Other Services       Precautions / Restrictions Precautions Precautions: Fall Restrictions Weight Bearing Restrictions: Yes RLE Weight Bearing: Weight bearing as tolerated      Mobility  Bed Mobility Overal bed mobility: Needs Assistance Bed Mobility: Supine to Sit     Supine to sit: HOB elevated;Min guard     General bed mobility comments: pt used rail. cues to push off from bed to raise trunk.  Transfers Overall transfer level: Needs assistance Equipment used: None Transfers: Sit to/from Stand Sit to Stand: +2 physical assistance;From elevated surface;Mod assist         General transfer comment: cues to reach for walker upon standing. assist for anterior translation, rise from surface, and balance because pt leaned backwards upon standing.  Ambulation/Gait Ambulation/Gait assistance: Min guard;+2 safety/equipment Ambulation  Distance (Feet): 10 Feet Assistive device: Rolling walker (2 wheeled) Gait Pattern/deviations: Step-to pattern;Trunk flexed;Decreased stride length   Gait velocity interpretation: Below normal speed for age/gender General Gait Details: pt unsteady during gait. guard for safety. chair to follow  Stairs            Wheelchair Mobility    Modified Rankin (Stroke Patients Only)       Balance Overall balance assessment: Needs assistance   Sitting balance-Leahy Scale: Good       Standing balance-Leahy Scale: Poor                               Pertinent Vitals/Pain Pain Assessment: 0-10 Pain Score: 7  Pain Location: right hip Pain Descriptors / Indicators: Sore;Aching Pain Intervention(s): Limited activity within patient's tolerance;Premedicated before session;Repositioned    Home Living Family/patient expects to be discharged to:: Skilled nursing facility Living Arrangements: Alone   Type of Home: House Home Access: Stairs to enter Entrance Stairs-Rails: Right Entrance Stairs-Number of Steps: 5 Home Layout: One level Home Equipment: Walker - 2 wheels;Cane - single point;Other (comment) Additional Comments: pt states he still sleeps in lift chair    Prior Function Level of Independence: Independent with assistive device(s)         Comments: pt was using RW but with recent falls has been having difficulty moving and staying in his chair     Hand Dominance        Extremity/Trunk Assessment               Lower Extremity Assessment: RLE deficits/detail RLE Deficits / Details: limited strength  and ROM due to pain    Cervical / Trunk Assessment: Kyphotic  Communication   Communication: No difficulties  Cognition Arousal/Alertness: Awake/alert Behavior During Therapy: WFL for tasks assessed/performed Overall Cognitive Status: Within Functional Limits for tasks assessed                      General Comments      Exercises         Assessment/Plan    PT Assessment Patient needs continued PT services  PT Diagnosis Difficulty walking;Acute pain   PT Problem List Decreased strength;Decreased range of motion;Decreased balance;Decreased activity tolerance;Decreased mobility;Decreased safety awareness  PT Treatment Interventions DME instruction;Gait training;Stair training;Functional mobility training;Therapeutic activities;Therapeutic exercise;Balance training;Patient/family education   PT Goals (Current goals can be found in the Care Plan section) Acute Rehab PT Goals Patient Stated Goal: pt agreeable to increase mobility PT Goal Formulation: With patient Time For Goal Achievement: 04/08/16 Potential to Achieve Goals: Good    Frequency Min 3X/week   Barriers to discharge Decreased caregiver support      Co-evaluation               End of Session Equipment Utilized During Treatment: Gait belt Activity Tolerance: Patient limited by pain (pt said he could walk more if he was wearing shoes) Patient left: in chair;with call bell/phone within reach;with chair alarm set      Functional Assessment Tool Used: clinical judgement Functional Limitation: Mobility: Walking and moving around Mobility: Walking and Moving Around Current Status [id number]): At least 40 percent but less than 60 percent impaired, limited or restricted Mobility: Walking and Moving Around Goal Status [phone]): At least 1 percent but less than 20 percent impaired, limited or restricted    Time: 1127-1147 PT Time Calculation (min) (ACUTE ONLY): 20 min   Charges:   PT Evaluation $PT Eval Moderate Complexity: 1 Procedure     PT G Codes:   PT G-Codes **NOT FOR INPATIENT CLASS** Functional Assessment Tool Used: clinical judgement Functional Limitation: Mobility: Walking and moving around Mobility: Walking and Moving Around Current Status [id number]): At least 40 percent but less than 60 percent impaired, limited or restricted Mobility:  Walking and Moving Around Goal Status [phone]): At least 1 percent but less than 20 percent impaired, limited or restricted    Haze Justin 04/01/2016, 12:51 PM   Haze Justin, [place]
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
## 2016-04-01 NOTE — Progress Notes (Signed)
Subjective:  Jesus Daniel's hip pain is still present, but much better controlled today. He denies any fevers, chest pain, or dyspnea.  Objective: Vital signs in last 24 hours: Filed Vitals:   03/31/16 1545 03/31/16 1804 03/31/16 2017 04/01/16 0230  BP: 122/74 149/79 130/74 106/60  Pulse: 94 102 107 99  Temp:  97.4 F (36.3 C) 97.9 F (36.6 C) 98.8 F (37.1 C)  TempSrc:  Oral Oral Oral  Resp: 24 21 19 18   Height:      Weight:      SpO2: 97% 97% 97% 93%   Weight change:   Intake/Output Summary (Last 24 hours) at 04/01/16 1130 Last data filed at 04/01/16 0841  Gross per 24 hour  Intake    240 ml  Output    100 ml  Net    140 ml   Physical Exam: General: Lying in bed, NAD Cardiovascular: Systolic murmur, RRR Pulmonary: CTAB in anterior fields, breathing unlabored Abdominal: Soft, NT/ND. Normal Bowel sounds Extremities: Chronic venous stasis changes with non-pitting edema. No clubbing, cyanosis. Skin: No overlying ecchymosis on right hip MSK: Right hip movement limited by pain.  Neurological: Speech normal. Not tremulous today  Lab Results: Basic Metabolic Panel:  Recent Labs Lab 03/31/16 1807 04/01/16 0702  NA 132*  --   K 3.3*  --   CL 91*  --   CO2 25  --   GLUCOSE 98  --   BUN 7  --   CREATININE 0.83  --   CALCIUM 9.0  --   MG  --  1.3*   CBC:  Recent Labs Lab 03/31/16 1807  WBC 8.2  NEUTROABS 5.3  HGB 12.1*  HCT 36.6*  MCV 94.6  PLT 281   Micro Results: No results found for this or any previous visit (from the past 240 hour(s)). Studies/Results: Dg Pelvis 1-2 Views  03/31/2016  CLINICAL DATA:  Right hip pain, multiple recent falls, prior right hip replacement in November 2016 EXAM: PELVIS - 1-2 VIEW COMPARISON:  10/18/2015 FINDINGS: Right hip arthroplasty in satisfactory position. No evidence of hardware fracture or loosening. Associated heterotopic calcification. Mild degenerative changes of the left hip. No fracture or dislocation is seen.  Visualized bony pelvis appears intact. Mild degenerative changes the lower lumbar spine IMPRESSION: Right hip arthroplasty in satisfactory position. No evidence of complication. No fracture or dislocation is seen. Electronically Signed   By: Julian Hy M.D.   On: 03/31/2016 14:29   Ct Hip Right Wo Contrast  03/31/2016  CLINICAL DATA:  Status post fall 6 days ago. Severe right hip pain. The patient is unable to walk. EXAM: CT OF THE RIGHT HIP WITHOUT CONTRAST TECHNIQUE: Multidetector CT imaging of the right hip was performed according to the standard protocol. Multiplanar CT image reconstructions were also generated. COMPARISON:  Plain film of the pelvis earlier today. Scratch the single view of the pelvis earlier today and 10/18/2015. CT pelvis 07/04/2015. FINDINGS: Right total hip arthroplasty is in place. There is extensive osteolysis about the acetabular component which is new since the prior study. The medial wall of the acetabulum is markedly thinned and fragmented, new since the most recent examination. Extensive heterotopic calcification is present about the device. Lucency is seen about both anchoring screws in the acetabulum. There is also some lucency about the proximal aspect of the femoral component. The device is located and no fracture is identified. Extensive stranding is present in subcutaneous fatty tissues about the right hip. The right iliacus  muscle is markedly swollen and there appears to be a fluid collection within it measuring approximately 3.7 cm transverse by 4.8 cm AP. The craniocaudal dimension of the collection cannot be assessed on this study as it is not entirely included. A collection in the shallow subcutaneous soft tissues measures approximately 2.2 cm AP x 1.7 cm transverse by 3.8 cm craniocaudal. Imaged musculature of the pelvis demonstrates some atrophy. Extensive atherosclerotic vascular disease is noted. IMPRESSION: Right hip arthroplasty in place with extensive  ostial license, heterotopic calcification and loosening of the acetabular component. Fluid collection in the right iliacus muscle is incompletely visualized. Findings are most worrisome for infection and abscess formation in the iliacus. Particle disease is possible but thought less likely. Extensive stranding in subcutaneous fatty tissues over the right hip with a likely subcutaneous fluid collection worrisome for abscess as described above. Negative for fracture. Electronically Signed   By: Inge Rise M.D.   On: 03/31/2016 17:07   Dg Femur, Min 2 Views Right  03/31/2016  CLINICAL DATA:  Right hip pain, multiple falls, status post right hip arthroplasty in 10/2015 EXAM: RIGHT FEMUR 2 VIEWS COMPARISON:  None. FINDINGS: Right hip arthroplasty in satisfactory position. No evidence of complication. Heterotopic calcification on the right hip prosthesis. No fracture or dislocation is seen. Mild tricompartmental degenerative changes of the knee. No suprapatellar knee joint effusion. Vascular calcifications. IMPRESSION: Right hip arthroplasty, without evidence of complication. No fracture or dislocation is seen. Electronically Signed   By: Julian Hy M.D.   On: 03/31/2016 14:31   Medications: I have reviewed the patient's current medications. Scheduled Meds: . magnesium sulfate 1 - 4 g bolus IVPB  4 g Intravenous Once  . polyethylene glycol  17 g Oral Daily  . tamsulosin  0.4 mg Oral QPC supper   Continuous Infusions:  PRN Meds:.HYDROcodone-acetaminophen, HYDROmorphone (DILAUDID) injection, LORazepam **OR** LORazepam Assessment/Plan:  Right Hip Pain with Fluid Collection: CT right hip is concerning for a developing abscess, however, patient has no systemic signs of infection. This could represent a hematoma. Orthopedic surgery has seen him and recommend fluid aspiration.  - Awaiting joint aspiration today or tomorrow - Synovial cell counts and culture - PT consult - Vicodin 10-325 1 tablet  q6h prn pain, Hydromorphone 1 mg IV q2h prn pain for breakthorugh  Alcohol Use Disorder: Patient shows now withdrawal symptoms this AM. No CIWAs recorded.  - CIWA protocol - Banana bag  BPH: Tamsulosin 0.4 daily  DVT PPx: Lovenox [place] Dispo: Disposition is deferred at this time, awaiting improvement of current medical problems.  Anticipated discharge in approximately 1-2 day(s).   The patient does not have a current PCP (No primary care provider on file.) and does not need an [facility] hospital follow-up appointment after discharge.  The patient does have transportation limitations that hinder transportation to clinic appointments.  .Services Needed at time of discharge: Y = Yes, Blank = No PT:   OT:   RN:   Equipment:   Other:       Liberty Handy, MD 04/01/2016, 11:30 AM

## 2016-04-02 ENCOUNTER — Observation Stay (HOSPITAL_COMMUNITY): Payer: PPO

## 2016-04-02 DIAGNOSIS — R296 Repeated falls: Secondary | ICD-10-CM | POA: Diagnosis not present

## 2016-04-02 DIAGNOSIS — Z951 Presence of aortocoronary bypass graft: Secondary | ICD-10-CM | POA: Diagnosis not present

## 2016-04-02 DIAGNOSIS — B9562 Methicillin resistant Staphylococcus aureus infection as the cause of diseases classified elsewhere: Secondary | ICD-10-CM | POA: Diagnosis not present

## 2016-04-02 DIAGNOSIS — F419 Anxiety disorder, unspecified: Secondary | ICD-10-CM | POA: Diagnosis not present

## 2016-04-02 DIAGNOSIS — I872 Venous insufficiency (chronic) (peripheral): Secondary | ICD-10-CM | POA: Diagnosis not present

## 2016-04-02 DIAGNOSIS — T84030A Mechanical loosening of internal right hip prosthetic joint, initial encounter: Secondary | ICD-10-CM | POA: Diagnosis not present

## 2016-04-02 DIAGNOSIS — M1990 Unspecified osteoarthritis, unspecified site: Secondary | ICD-10-CM | POA: Diagnosis not present

## 2016-04-02 DIAGNOSIS — K651 Peritoneal abscess: Secondary | ICD-10-CM | POA: Diagnosis not present

## 2016-04-02 DIAGNOSIS — M109 Gout, unspecified: Secondary | ICD-10-CM | POA: Diagnosis not present

## 2016-04-02 DIAGNOSIS — Y831 Surgical operation with implant of artificial internal device as the cause of abnormal reaction of the patient, or of later complication, without mention of misadventure at the time of the procedure: Secondary | ICD-10-CM | POA: Diagnosis not present

## 2016-04-02 DIAGNOSIS — Z471 Aftercare following joint replacement surgery: Secondary | ICD-10-CM | POA: Diagnosis not present

## 2016-04-02 DIAGNOSIS — M6281 Muscle weakness (generalized): Secondary | ICD-10-CM | POA: Diagnosis not present

## 2016-04-02 DIAGNOSIS — N400 Enlarged prostate without lower urinary tract symptoms: Secondary | ICD-10-CM | POA: Diagnosis not present

## 2016-04-02 DIAGNOSIS — M00051 Staphylococcal arthritis, right hip: Secondary | ICD-10-CM | POA: Diagnosis not present

## 2016-04-02 DIAGNOSIS — Z9181 History of falling: Secondary | ICD-10-CM | POA: Diagnosis not present

## 2016-04-02 DIAGNOSIS — K6812 Psoas muscle abscess: Secondary | ICD-10-CM | POA: Diagnosis not present

## 2016-04-02 DIAGNOSIS — L02415 Cutaneous abscess of right lower limb: Secondary | ICD-10-CM | POA: Diagnosis not present

## 2016-04-02 DIAGNOSIS — F329 Major depressive disorder, single episode, unspecified: Secondary | ICD-10-CM | POA: Diagnosis not present

## 2016-04-02 DIAGNOSIS — W109XXA Fall (on) (from) unspecified stairs and steps, initial encounter: Secondary | ICD-10-CM | POA: Diagnosis not present

## 2016-04-02 DIAGNOSIS — Z9889 Other specified postprocedural states: Secondary | ICD-10-CM

## 2016-04-02 DIAGNOSIS — R262 Difficulty in walking, not elsewhere classified: Secondary | ICD-10-CM | POA: Diagnosis not present

## 2016-04-02 DIAGNOSIS — S7001XA Contusion of right hip, initial encounter: Secondary | ICD-10-CM | POA: Diagnosis not present

## 2016-04-02 DIAGNOSIS — F1010 Alcohol abuse, uncomplicated: Secondary | ICD-10-CM | POA: Diagnosis not present

## 2016-04-02 DIAGNOSIS — Z96641 Presence of right artificial hip joint: Secondary | ICD-10-CM | POA: Diagnosis not present

## 2016-04-02 DIAGNOSIS — M25559 Pain in unspecified hip: Secondary | ICD-10-CM | POA: Diagnosis not present

## 2016-04-02 DIAGNOSIS — Y838 Other surgical procedures as the cause of abnormal reaction of the patient, or of later complication, without mention of misadventure at the time of the procedure: Secondary | ICD-10-CM | POA: Diagnosis not present

## 2016-04-02 DIAGNOSIS — T8451XA Infection and inflammatory reaction due to internal right hip prosthesis, initial encounter: Secondary | ICD-10-CM | POA: Diagnosis not present

## 2016-04-02 DIAGNOSIS — M25551 Pain in right hip: Secondary | ICD-10-CM | POA: Diagnosis not present

## 2016-04-02 DIAGNOSIS — F1020 Alcohol dependence, uncomplicated: Secondary | ICD-10-CM | POA: Diagnosis not present

## 2016-04-02 DIAGNOSIS — B9561 Methicillin susceptible Staphylococcus aureus infection as the cause of diseases classified elsewhere: Secondary | ICD-10-CM | POA: Diagnosis not present

## 2016-04-02 DIAGNOSIS — Z5189 Encounter for other specified aftercare: Secondary | ICD-10-CM | POA: Diagnosis not present

## 2016-04-02 DIAGNOSIS — T148 Other injury of unspecified body region: Secondary | ICD-10-CM | POA: Diagnosis not present

## 2016-04-02 DIAGNOSIS — F17290 Nicotine dependence, other tobacco product, uncomplicated: Secondary | ICD-10-CM | POA: Diagnosis not present

## 2016-04-02 DIAGNOSIS — K219 Gastro-esophageal reflux disease without esophagitis: Secondary | ICD-10-CM | POA: Diagnosis not present

## 2016-04-02 LAB — BASIC METABOLIC PANEL
Anion gap: 10 (ref 5–15)
CALCIUM: 8.4 mg/dL — AB (ref 8.9–10.3)
CHLORIDE: 95 mmol/L — AB (ref 101–111)
CO2: 28 mmol/L (ref 22–32)
CREATININE: 0.61 mg/dL (ref 0.61–1.24)
GFR calc non Af Amer: >60 mL/min (ref >60)
Glucose, Bld: 103 mg/dL — ABNORMAL HIGH (ref 65–99)
Potassium: 3.3 mmol/L — ABNORMAL LOW (ref 3.5–5.1)
SODIUM: 133 mmol/L — AB (ref 135–145)

## 2016-04-02 LAB — GRAM STAIN

## 2016-04-02 LAB — C-REACTIVE PROTEIN: CRP: 8.4 mg/dL — ABNORMAL HIGH (ref <1.0)

## 2016-04-02 LAB — SYNOVIAL CELL COUNT + DIFF, W/ CRYSTALS: CRYSTALS FLUID: NONE SEEN

## 2016-04-02 LAB — SEDIMENTATION RATE: SED RATE: 90 mm/h — AB (ref 0–16)

## 2016-04-02 LAB — MAGNESIUM: MAGNESIUM: 1.7 mg/dL (ref 1.7–2.4)

## 2016-04-02 MED ORDER — FENTANYL CITRATE (PF) 100 MCG/2ML IJ SOLN
INTRAMUSCULAR | Status: AC
  Filled 2016-04-02: qty 4

## 2016-04-02 MED ORDER — VANCOMYCIN HCL 10 G IV SOLR
2000.0000 mg | Freq: Once | INTRAVENOUS | Status: AC
  Administered 2016-04-02: 2000 mg via INTRAVENOUS
  Filled 2016-04-02: qty 2000

## 2016-04-02 MED ORDER — LIDOCAINE HCL (PF) 1 % IJ SOLN
INTRAMUSCULAR | Status: AC
  Filled 2016-04-02: qty 5

## 2016-04-02 MED ORDER — FENTANYL CITRATE (PF) 100 MCG/2ML IJ SOLN
INTRAMUSCULAR | Status: DC | PRN
  Administered 2016-04-02: 50 ug via INTRAVENOUS

## 2016-04-02 MED ORDER — SODIUM CHLORIDE 0.9 % IJ SOLN
INTRAMUSCULAR | Status: AC
  Administered 2016-04-02: 10 mL
  Filled 2016-04-02: qty 10

## 2016-04-02 MED ORDER — LIDOCAINE HCL 1 % IJ SOLN
INTRAMUSCULAR | Status: AC
  Filled 2016-04-02: qty 20

## 2016-04-02 MED ORDER — VANCOMYCIN HCL IN DEXTROSE 1-5 GM/200ML-% IV SOLN
1000.0000 mg | Freq: Three times a day (TID) | INTRAVENOUS | Status: AC
  Administered 2016-04-02 – 2016-04-04 (×5): 1000 mg via INTRAVENOUS
  Filled 2016-04-02 (×8): qty 200

## 2016-04-02 MED ORDER — POTASSIUM CHLORIDE CRYS ER 20 MEQ PO TBCR
40.0000 meq | EXTENDED_RELEASE_TABLET | Freq: Once | ORAL | Status: AC
  Administered 2016-04-02: 40 meq via ORAL
  Filled 2016-04-02: qty 2

## 2016-04-02 MED ORDER — HYDROCODONE-ACETAMINOPHEN 10-325 MG PO TABS
1.0000 | ORAL_TABLET | Freq: Four times a day (QID) | ORAL | Status: DC
  Administered 2016-04-02 – 2016-04-04 (×8): 1 via ORAL
  Filled 2016-04-02 (×8): qty 1

## 2016-04-02 MED ORDER — HYDROMORPHONE HCL 1 MG/ML IJ SOLN
1.0000 mg | INTRAMUSCULAR | Status: DC | PRN
  Administered 2016-04-02 – 2016-04-03 (×4): 1 mg via INTRAVENOUS
  Filled 2016-04-02 (×4): qty 1

## 2016-04-02 MED ORDER — IOHEXOL 180 MG/ML  SOLN: 20.0000 mL | Freq: Once | INTRAMUSCULAR | Status: DC | PRN

## 2016-04-02 NOTE — Progress Notes (Signed)
Pharmacy Antibiotic Note  Jesus Daniel is a 70 y.o. male admitted on 03/31/2016 with septic arthritis.  Pharmacy has been consulted for vancomycin dosing.  Plan: Vancomycin 1 gram IV every 8 hours.  Goal trough 15-20 mcg/mL. Monitor clinical progress, c/s, renal function, abx plan/LOT VT@SS  as indicated   Height: 5\' 9"  (175.3 cm) Weight: 220 lb (99.791 kg) IBW/kg (Calculated) : 70.7  Temp (24hrs), Avg:98.3 F (36.8 C), Min:98 F (36.7 C), Max:98.8 F (37.1 C)   Recent Labs Lab 03/31/16 1807 04/02/16 0310  WBC 8.2  --   CREATININE 0.83 0.61    Estimated Creatinine Clearance: 102.9 mL/min (by C-G formula based on Cr of 0.61).    No Known Allergies  Antimicrobials this admission: 4/25 vancomycin >>   Dose adjustments this admission: n/a  Microbiology results: 4/25 R hip fluid: Moderate GPC in pairs 4/24 MRSA PCR: negative   Thank you for allowing [place] to participate in this patients care. Jens Som, PharmD Pager: [phone] 04/02/2016 1:52 PM

## 2016-04-02 NOTE — Procedures (Signed)
Interventional Radiology Procedure Note  Procedure:  CT aspiration of right pelvic/iliac fluid collection.  ~ 40cc of complex sero-sanguinous fluid aspirated Complications: None Recommendations:  - Follow up labs - Routine wound care   Signed,  Dulcy Fanny. Earleen Newport, DO

## 2016-04-02 NOTE — Sedation Documentation (Signed)
Patient is resting comfortably. Vitals stable. No complaints from pt at this time.
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
## 2016-04-02 NOTE — Progress Notes (Signed)
Subjective:  Mr. Elsayed's hip pain is still present. He reports that this Vicodin is more effective than the Hydromorphone if he gets it on time. He denies any fevers, chest pain, or dyspnea.  Objective: Vital signs in last 24 hours: Filed Vitals:   04/02/16 1050 04/02/16 1102 04/02/16 1107 04/02/16 1111  BP: 105/65 105/56 99/65 93/59   Pulse: 92 97 95 92  Temp:      TempSrc:      Resp: 22 26 21 21   Height:      Weight:      SpO2: 98% 92% 90% 93%   Weight change:   Intake/Output Summary (Last 24 hours) at 04/02/16 1137 Last data filed at 04/02/16 0900  Gross per 24 hour  Intake    240 ml  Output   1025 ml  Net   -785 ml   Physical Exam: General: Lying in bed, NAD Cardiovascular: Systolic murmur, RRR Pulmonary: CTAB. breathing unlabored Abdominal: Soft, NT/ND. Normal Bowel sounds Extremities: Chronic venous stasis changes with non-pitting edema. No clubbing, cyanosis. Skin: No overlying ecchymosis on right hip MSK: Right hip movement limited by pain.  Neurological: Speech normal. Not tremulous today  Lab Results: Basic Metabolic Panel:  Recent Labs Lab 03/31/16 1807 04/01/16 0702 04/02/16 0310  NA 132*  --  133*  K 3.3*  --  3.3*  CL 91*  --  95*  CO2 25  --  28  GLUCOSE 98  --  103*  BUN 7  --  <5*  CREATININE 0.83  --  0.61  CALCIUM 9.0  --  8.4*  MG  --  1.3* 1.7   CBC:  Recent Labs Lab 03/31/16 1807  WBC 8.2  NEUTROABS 5.3  HGB 12.1*  HCT 36.6*  MCV 94.6  PLT 281    Studies/Results: Dg Pelvis 1-2 Views  03/31/2016  CLINICAL DATA:  Right hip pain, multiple recent falls, prior right hip replacement in November 2016 EXAM: PELVIS - 1-2 VIEW COMPARISON:  10/18/2015 FINDINGS: Right hip arthroplasty in satisfactory position. No evidence of hardware fracture or loosening. Associated heterotopic calcification. Mild degenerative changes of the left hip. No fracture or dislocation is seen. Visualized bony pelvis appears intact. Mild degenerative changes  the lower lumbar spine IMPRESSION: Right hip arthroplasty in satisfactory position. No evidence of complication. No fracture or dislocation is seen. Electronically Signed   By: Julian Hy M.D.   On: 03/31/2016 14:29   Ct Hip Right Wo Contrast  03/31/2016  CLINICAL DATA:  Status post fall 6 days ago. Severe right hip pain. The patient is unable to walk. EXAM: CT OF THE RIGHT HIP WITHOUT CONTRAST TECHNIQUE: Multidetector CT imaging of the right hip was performed according to the standard protocol. Multiplanar CT image reconstructions were also generated. COMPARISON:  Plain film of the pelvis earlier today. Scratch the single view of the pelvis earlier today and 10/18/2015. CT pelvis 07/04/2015. FINDINGS: Right total hip arthroplasty is in place. There is extensive osteolysis about the acetabular component which is new since the prior study. The medial wall of the acetabulum is markedly thinned and fragmented, new since the most recent examination. Extensive heterotopic calcification is present about the device. Lucency is seen about both anchoring screws in the acetabulum. There is also some lucency about the proximal aspect of the femoral component. The device is located and no fracture is identified. Extensive stranding is present in subcutaneous fatty tissues about the right hip. The right iliacus muscle is markedly swollen and there  appears to be a fluid collection within it measuring approximately 3.7 cm transverse by 4.8 cm AP. The craniocaudal dimension of the collection cannot be assessed on this study as it is not entirely included. A collection in the shallow subcutaneous soft tissues measures approximately 2.2 cm AP x 1.7 cm transverse by 3.8 cm craniocaudal. Imaged musculature of the pelvis demonstrates some atrophy. Extensive atherosclerotic vascular disease is noted. IMPRESSION: Right hip arthroplasty in place with extensive ostial license, heterotopic calcification and loosening of the  acetabular component. Fluid collection in the right iliacus muscle is incompletely visualized. Findings are most worrisome for infection and abscess formation in the iliacus. Particle disease is possible but thought less likely. Extensive stranding in subcutaneous fatty tissues over the right hip with a likely subcutaneous fluid collection worrisome for abscess as described above. Negative for fracture. Electronically Signed   By: Inge Rise M.D.   On: 03/31/2016 17:07   Dg Femur, Min 2 Views Right  03/31/2016  CLINICAL DATA:  Right hip pain, multiple falls, status post right hip arthroplasty in 10/2015 EXAM: RIGHT FEMUR 2 VIEWS COMPARISON:  None. FINDINGS: Right hip arthroplasty in satisfactory position. No evidence of complication. Heterotopic calcification on the right hip prosthesis. No fracture or dislocation is seen. Mild tricompartmental degenerative changes of the knee. No suprapatellar knee joint effusion. Vascular calcifications. IMPRESSION: Right hip arthroplasty, without evidence of complication. No fracture or dislocation is seen. Electronically Signed   By: Julian Hy M.D.   On: 03/31/2016 14:31   Medications: I have reviewed the patient's current medications. Scheduled Meds: . fentaNYL      . HYDROcodone-acetaminophen  1 tablet Oral Q6H  . lidocaine (PF)      . lidocaine      . polyethylene glycol  17 g Oral Daily  . tamsulosin  0.4 mg Oral QPC supper   Continuous Infusions:  PRN Meds:.fentaNYL, HYDROmorphone (DILAUDID) injection, iohexol, LORazepam **OR** LORazepam Assessment/Plan:  Right Hip Pain with Fluid Collection: CT right hip is concerning for a developing abscess, however, patient has no systemic signs of infection. This could represent a hematoma. CT-guided aspiration today. PT recommends SNF, and the patient requests [facility]. - Awaiting, synovial cell counts, gram stain, and culture - Vicodin 10-325 1 tablet q6h scheduled for pain pain, Hydromorphone 1  mg IV q3h prn pain for breakthorugh  Alcohol Use Disorder: Patient shows now withdrawal symptoms this AM. No CIWAs recorded.  - CIWA protocol - Banana bag  BPH: Tamsulosin 0.4 daily  DVT PPx: Lovenox [place] Dispo: Disposition is deferred at this time, awaiting improvement of current medical problems.  Anticipated discharge in approximately 1-2 day(s) to SNF.  The patient does not have a current PCP (No primary care provider on file.) and does not need an [facility] hospital follow-up appointment after discharge.  The patient does have transportation limitations that hinder transportation to clinic appointments.  .Services Needed at time of discharge: Y = Yes, Blank = No PT:   OT:   RN:   Equipment:   Other:       Liberty Handy, MD 04/02/2016, 11:37 AM

## 2016-04-02 NOTE — Sedation Documentation (Signed)
Patient is resting comfortably. Vitals stable. 

## 2016-04-02 NOTE — Progress Notes (Addendum)
Patient ID: Jesus Daniel, male   DOB: [birth year], 70 y.o.   MRN: [id number]   Spoke to Dr.Ford regarding CT aspiration of fluid collection.  Patient continues to be afebrile, and we would like to obtain sed rate and CRP to help decide definitive treatment.  In the meantime, ok to continue vanc per medicine.

## 2016-04-02 NOTE — Progress Notes (Signed)
MD made aware of Aerobic bottle showing gram + cocci with clusters. Vancomycin ordered for patient.

## 2016-04-02 NOTE — Plan of Care (Signed)
Problem: Safety: Goal: Ability to remain free from injury will improve Outcome: Progressing Patient was reminded of the need to use the call bell when needing to get up. I observed patient being impulsive when attempting to go to the bathroom. He was quickly reoriented and followed commands. Will continue to monitor.

## 2016-04-02 NOTE — Progress Notes (Signed)
Critical Lab  Bodily aspiration of right hip showed Gram + Cocci with Pairs. Critical was called to nurse covering me for lunch. I just paged MD in which he informed me Vancomycin was ordered for patient.

## 2016-04-02 NOTE — Sedation Documentation (Signed)
Patient is resting comfortably. No complaints at this time, vitals stable. 

## 2016-04-03 ENCOUNTER — Ambulatory Visit: Payer: PPO | Admitting: Physical Therapy

## 2016-04-03 LAB — CBC
HEMATOCRIT: 28.8 % — AB (ref 39.0–52.0)
Hemoglobin: 9.5 g/dL — ABNORMAL LOW (ref 13.0–17.0)
MCH: 32.3 pg (ref 26.0–34.0)
MCHC: 33 g/dL (ref 30.0–36.0)
MCV: 98 fL (ref 78.0–100.0)
PLATELETS: 186 10*3/uL (ref 150–400)
RBC: 2.94 MIL/uL — ABNORMAL LOW (ref 4.22–5.81)
RDW: 15.8 % — AB (ref 11.5–15.5)
WBC: 4.2 10*3/uL (ref 4.0–10.5)

## 2016-04-03 LAB — BASIC METABOLIC PANEL
Anion gap: 7 (ref 5–15)
CALCIUM: 8.4 mg/dL — AB (ref 8.9–10.3)
CO2: 28 mmol/L (ref 22–32)
CREATININE: 0.64 mg/dL (ref 0.61–1.24)
Chloride: 98 mmol/L — ABNORMAL LOW (ref 101–111)
GFR calc non Af Amer: >60 mL/min (ref >60)
Glucose, Bld: 110 mg/dL — ABNORMAL HIGH (ref 65–99)
Potassium: 3.6 mmol/L (ref 3.5–5.1)
Sodium: 133 mmol/L — ABNORMAL LOW (ref 135–145)

## 2016-04-03 MED ORDER — MAGNESIUM SULFATE 4 GM/100ML IV SOLN
4.0000 g | Freq: Once | INTRAVENOUS | Status: AC
  Administered 2016-04-03: 4 g via INTRAVENOUS
  Filled 2016-04-03: qty 100

## 2016-04-03 MED ORDER — DIPHENHYDRAMINE HCL 25 MG PO CAPS: 25.0000 mg | ORAL_CAPSULE | Freq: Four times a day (QID) | ORAL | Status: DC | PRN

## 2016-04-03 MED ORDER — HYDROCERIN EX CREA
TOPICAL_CREAM | Freq: Two times a day (BID) | CUTANEOUS | Status: DC
  Administered 2016-04-03 – 2016-04-05 (×5): via TOPICAL
  Filled 2016-04-03: qty 113

## 2016-04-03 NOTE — Progress Notes (Addendum)
Patient ID: Jesus Daniel, male   DOB: 09/25/1946, 70 y.o.   MRN: [id number]  Due to elevated sed rate/crp and culture growth from fluid pocket aspirate outside the joint, we are strongly considering sx with I&D.  Would like CT guided aspiration of right hip JOINT, and would like for medicine to order this.

## 2016-04-03 NOTE — Consult Note (Signed)
Mr Fulford well known to me dating back to before I did his Hip replacement 10/16. He has been seen and examined today as well as after admission Monday. All labs and studies reviewed. CT scan and aspiration of hematoma/abcess in Psoas muscle reviewed and discussed with Radiologist. No fluid in hip joint itself and aspiration of joint NOT done intentionally to avoid contamination from surrounding  Tissue. Afebrile without elevated WBC. Sed rate and C-reactive protein up as expected, will use these to assess clinical improvement hopefully. He feels better today with minimal pain when I log roll his hip. X-rays and CT show that there is lucency around acetabular componnent as well as H.O. Given clinical course as well as the morbidity associated with one or two stage major revision of components in his hip, I am holding off operative intervention for now. Hopeful this is extraarticular process of infected traumatic psoas hematoma. Will follow him and change to more aggressive operative approach if he doesn't continue to improve.  Recomendation for now: PT consult, ambulate [facility] Social Service consult. (he cannot go back home alone, there have been numerous falls last few months) ID consult with IV antibiotic RX (there choice) at least 6 weeks FU repeat sedrate and C-reactive protein 5-7 days I Will get repeat studies of abcess and hip if he is not improving  I am thankful for medical management during hospitalization  Kathryne Hitch MD

## 2016-04-03 NOTE — Progress Notes (Signed)
Subjective:  Jesus Daniel's hip pain is much improved after the fluid aspiration yesterday, but he continues to intermittently need IV pain control. He is complaining of a robust itching sensation on his back that he attributes to the Vicodin.  Objective: Vital signs in last 24 hours: Filed Vitals:   04/02/16 1111 04/02/16 1300 04/02/16 2300 04/03/16 0300  BP: 93/59 105/67 124/69 106/61  Pulse: 92 94 107 91  Temp:  98.8 F (37.1 C) 98.9 F (37.2 C) 97.9 F (36.6 C)  TempSrc:  Oral Oral Oral  Resp: _0 Height:      Weight:      SpO2: 93% 96% 97% 97%   Weight change:   Intake/Output Summary (Last 24 hours) at 04/03/16 1051 Last data filed at 04/03/16 0300  Gross per 24 hour  Intake    240 ml  Output    775 ml  Net   -535 ml   Physical Exam: General: Lying in bed, NAD Cardiovascular: Systolic murmur, RRR Pulmonary: CTAB. breathing unlabored Abdominal: Soft, NT/ND. Normal Bowel sounds Extremities: Chronic venous stasis changes with non-pitting edema. No clubbing, cyanosis. Skin: No overlying ecchymosis on right hip MSK: Right hip movement limited by pain.  Neurological: Speech normal. Not tremulous today  Lab Results: Basic Metabolic Panel:  Recent Labs Lab 04/01/16 0702 04/02/16 0310 04/03/16 0403  NA  --  133* 133*  K  --  3.3* 3.6  CL  --  95* 98*  CO2  --  28 28  GLUCOSE  --  103* 110*  BUN  --  <5* <5*  CREATININE  --  0.61 0.64  CALCIUM  --  8.4* 8.4*  MG 1.3* 1.7  --    CBC:  Recent Labs Lab 03/31/16 1807 04/03/16 0403  WBC 8.2 4.2  NEUTROABS 5.3  --   HGB 12.1* 9.5*  HCT 36.6* 28.8*  MCV 94.6 98.0  PLT 281 186    Studies/Results: Ct Aspiration  04/02/2016  INDICATION: 70 year old male with a history of concern for right hip infection. He has been referred for fluid aspiration. EXAM: CT GUIDANCE NEEDLE PLACEMENT MEDICATIONS: The patient is currently admitted to the hospital and receiving intravenous antibiotics. The antibiotics  were administered within an appropriate time frame prior to the initiation of the procedure. ANESTHESIA/SEDATION: Fentanyl 50 mcg IV; Versed 0 mg IV No moderate sedation with only 1 medication administered. COMPLICATIONS: None PROCEDURE: Informed written consent was obtained from the patient after a thorough discussion of the procedural risks, benefits and alternatives. All questions were addressed. Maximal Sterile Barrier Technique was utilized including caps, mask, sterile gowns, sterile gloves, sterile drape, hand hygiene and skin antiseptic. A timeout was performed prior to the initiation of the procedure. Patient was positioned supine position on CT gantry table and a scout CT of the pelvis was acquired for planning purposes. Once the patient is prepped and draped in the usual sterile fashion adjacent to the right iliac crest, the skin and subcutaneous tissues were generously infiltrated with 1% lidocaine for local anesthesia. A small stab incision was made with 11 blade scalpel. A Yueh needle was advanced into the fluid collection of the right iliacus musculature. Approximately 5 cc of complex fluid was aspirated. Amplatz wire was then placed through the Yueh needle which was exchanged for a 10 French drainage catheter. Drainage catheter was used to aspirate another approximately 35 cc of complex fluid. Sample was sent the lab for analysis. All drains were removed and a  sterile bandage was placed. Patient tolerated the procedure well and remained hemodynamically stable throughout. No complications were encountered and no significant blood loss encountered. IMPRESSION: Status post CT-guided aspiration of right pelvic fluid collection contiguous with the right hip process, with approximately 40 cc of complex serosanguineous fluid aspirated. Sample sent the lab for analysis. Signed, Dulcy Fanny. Earleen Newport, DO [facility] Radiology Electronically Signed   By: Corrie Mckusick  D.O.   On: 04/02/2016 11:48   Medications: I have reviewed the patient's current medications. Scheduled Meds: . hydrocerin   Topical BID  . HYDROcodone-acetaminophen  1 tablet Oral Q6H  . polyethylene glycol  17 g Oral Daily  . tamsulosin  0.4 mg Oral QPC supper  . vancomycin  1,000 mg Intravenous Q8H   Continuous Infusions:  PRN Meds:.diphenhydrAMINE, HYDROmorphone (DILAUDID) injection, iohexol, LORazepam **OR** LORazepam Assessment/Plan:  Extraarticular Right Hip Abscess: Fluid collection aspirate growing staph, awaiting sensitivities. He will require 6 weeks of antibiotics total, per ortho. No joint aspiration today to prevent seeding an infection with the fluid collection. Ortho recommends non-operative management for now. We will consult ID for antibiotic recommendations.  - Vancomycin per pharm, consult ID  - Repeat ESR, CRP 5-7 days from last reading - Vicodin 10-325 1 tablet q6h scheduled for pain pain, Hydromorphone 1 mg IV q3h prn pain for breakthrough - Repeat studies of abscess if not improving - CSW consult for SNF placement, prefers [facility]  Alcohol Use Disorder: Patient shows now withdrawal symptoms this AM. No CIWAs recorded.  - CIWA protocol - Banana bag   BPH: Tamsulosin 0.4 daily  DVT PPx: Lovenox [place]  Dispo: Disposition is deferred at this time, awaiting improvement of current medical problems.  Anticipated discharge in approximately 1-2 day(s) to SNF.  The patient does not have a current PCP (No primary care provider on file.) and does not need an [facility] hospital follow-up appointment after discharge.  The patient does have transportation limitations that hinder transportation to clinic appointments.  .Services Needed at time of discharge: Y = Yes, Blank = No PT:   OT:   RN:   Equipment:   Other:     LOS: 1 day   Liberty Handy, MD 04/03/2016, 10:51 AM

## 2016-04-03 NOTE — Progress Notes (Signed)
Physical Therapy Treatment Patient Details Name: Jesus Daniel MRN: [id number] DOB: 11/23/1946 Today's Date: 04/03/2016    History of Present Illness 70 yo admitted with Rt hip pain after 2 recent falls. 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 Requires assist to perform basic transfers and RW for support. Pt able to progress gait training distance this session with shoes donned. Will continue to follow.   Follow Up Recommendations  SNF;Supervision for mobility/OOB     Equipment Recommendations  None recommended by PT    Recommendations for Other Services       Precautions / Restrictions Precautions Precautions: Fall Restrictions Weight Bearing Restrictions: Yes RLE Weight Bearing: Weight bearing as tolerated    Mobility  Bed Mobility Overal bed mobility: Needs Assistance Bed Mobility: Supine to Sit     Supine to sit: Min assist;HOB elevated     General bed mobility comments: pt used rail. cues to push off from bed to raise trunk and assist was required for full transition to sitting.   Transfers Overall transfer level: Needs assistance Equipment used: Rolling walker (2 wheeled) Transfers: Sit to/from Stand Sit to Stand: Min assist;+2 physical assistance;From elevated surface         General transfer comment: cues to reach for walker upon standing. assist for anterior translation, rise from surface, and balance as pt with posterior lean upon standing.  Ambulation/Gait Ambulation/Gait assistance: Min guard;+2 safety/equipment Ambulation Distance (Feet): 50 Feet Assistive device: Rolling walker (2 wheeled) Gait Pattern/deviations: Step-through pattern;Decreased stride length;Trunk flexed Gait velocity: Decreased Gait velocity interpretation: Below normal speed for age/gender General Gait Details: pt unsteady during gait. guard for safety. chair to  follow   Stairs            Wheelchair Mobility    Modified Rankin (Stroke Patients Only)       Balance Overall balance assessment: Needs assistance Sitting-balance support: Feet supported;No upper extremity supported Sitting balance-Leahy Scale: Good     Standing balance support: No upper extremity supported;During functional activity Standing balance-Leahy Scale: Poor                      Cognition Arousal/Alertness: Awake/alert Behavior During Therapy: WFL for tasks assessed/performed Overall Cognitive Status: Within Functional Limits for tasks assessed                      Exercises General Exercises - Lower Extremity Ankle Circles/Pumps: 15 reps Quad Sets: 15 reps Gluteal Sets: 15 reps Long Arc Quad: 5 reps Hip ABduction/ADduction: 10 reps    General Comments        Pertinent Vitals/Pain Pain Assessment: Faces Faces Pain Scale: Hurts little more Pain Location: R hip Pain Descriptors / Indicators: Operative site guarding;Sore Pain Intervention(s): Limited activity within patient's tolerance;Monitored during session;Repositioned    Home Living                      Prior Function            PT Goals (current goals can now be found in the care plan section) Acute Rehab PT Goals Patient Stated Goal: Get back to walking like he was PT Goal Formulation: With patient Time For Goal Achievement: 04/08/16 Potential to Achieve Goals: Good    Frequency  Min 3X/week    PT Plan      Co-evaluation  End of Session Equipment Utilized During Treatment: Gait belt Activity Tolerance: Patient limited by fatigue;Patient limited by pain Patient left: in chair;with call bell/phone within reach;with chair alarm set     Time: 1000-1028 PT Time Calculation (min) (ACUTE ONLY): 28 min  Charges:  $Gait Training: 8-22 mins $Therapeutic Exercise: 8-22 mins                    G Codes:      Rolinda Roan 26-Apr-2016,  12:49 PM  Rolinda Roan, PT, DPT Acute Rehabilitation Services Pager: [phone]

## 2016-04-04 DIAGNOSIS — A4901 Methicillin susceptible Staphylococcus aureus infection, unspecified site: Secondary | ICD-10-CM | POA: Insufficient documentation

## 2016-04-04 DIAGNOSIS — Y838 Other surgical procedures as the cause of abnormal reaction of the patient, or of later complication, without mention of misadventure at the time of the procedure: Secondary | ICD-10-CM

## 2016-04-04 DIAGNOSIS — W19XXXA Unspecified fall, initial encounter: Secondary | ICD-10-CM | POA: Insufficient documentation

## 2016-04-04 DIAGNOSIS — B9561 Methicillin susceptible Staphylococcus aureus infection as the cause of diseases classified elsewhere: Secondary | ICD-10-CM

## 2016-04-04 DIAGNOSIS — Z96649 Presence of unspecified artificial hip joint: Secondary | ICD-10-CM

## 2016-04-04 DIAGNOSIS — L02415 Cutaneous abscess of right lower limb: Secondary | ICD-10-CM | POA: Insufficient documentation

## 2016-04-04 DIAGNOSIS — T8459XA Infection and inflammatory reaction due to other internal joint prosthesis, initial encounter: Secondary | ICD-10-CM | POA: Insufficient documentation

## 2016-04-04 DIAGNOSIS — T8451XA Infection and inflammatory reaction due to internal right hip prosthesis, initial encounter: Principal | ICD-10-CM

## 2016-04-04 DIAGNOSIS — F1020 Alcohol dependence, uncomplicated: Secondary | ICD-10-CM

## 2016-04-04 LAB — BASIC METABOLIC PANEL
Anion gap: 8 (ref 5–15)
CALCIUM: 8.3 mg/dL — AB (ref 8.9–10.3)
CO2: 30 mmol/L (ref 22–32)
CREATININE: 0.6 mg/dL (ref 0.6–1.3)
CREATININE: 0.65 mg/dL (ref 0.61–1.24)
Chloride: 96 mmol/L — ABNORMAL LOW (ref 101–111)
GFR calc non Af Amer: >60 mL/min (ref >60)
GLUCOSE: 103 mg/dL
Glucose, Bld: 103 mg/dL — ABNORMAL HIGH (ref 65–99)
Potassium: 4.1 mmol/L (ref 3.5–5.1)
SODIUM: 134 mmol/L — AB (ref 135–145)
Sodium: 134 mmol/L — AB (ref 137–147)

## 2016-04-04 LAB — CBC
HCT: 29.9 % — ABNORMAL LOW (ref 39.0–52.0)
Hemoglobin: 10.1 g/dL — ABNORMAL LOW (ref 13.0–17.0)
MCH: 33.1 pg (ref 26.0–34.0)
MCHC: 33.8 g/dL (ref 30.0–36.0)
MCV: 98 fL (ref 78.0–100.0)
Platelets: 205 10*3/uL (ref 150–400)
RBC: 3.05 MIL/uL — AB (ref 4.22–5.81)
RDW: 15.9 % — ABNORMAL HIGH (ref 11.5–15.5)
WBC: 4.7 10*3/uL (ref 4.0–10.5)

## 2016-04-04 LAB — MAGNESIUM: Magnesium: 1.9 mg/dL (ref 1.7–2.4)

## 2016-04-04 LAB — C-REACTIVE PROTEIN: CRP: 4.5 mg/dL — ABNORMAL HIGH (ref <1.0)

## 2016-04-04 LAB — SEDIMENTATION RATE: SED RATE: 104 mm/h — AB (ref 0–16)

## 2016-04-04 MED ORDER — MAGNESIUM SULFATE 4 GM/100ML IV SOLN
4.0000 g | Freq: Once | INTRAVENOUS | Status: AC
  Administered 2016-04-04: 4 g via INTRAVENOUS
  Filled 2016-04-04: qty 100

## 2016-04-04 MED ORDER — RIFAMPIN 300 MG PO CAPS
300.0000 mg | ORAL_CAPSULE | Freq: Two times a day (BID) | ORAL | Status: DC
  Administered 2016-04-04: 300 mg via ORAL
  Filled 2016-04-04 (×2): qty 1

## 2016-04-04 MED ORDER — ENOXAPARIN SODIUM 40 MG/0.4ML ~~LOC~~ SOLN
40.0000 mg | SUBCUTANEOUS | Status: DC
  Administered 2016-04-04: 40 mg via SUBCUTANEOUS
  Filled 2016-04-04: qty 0.4

## 2016-04-04 MED ORDER — HYDROCODONE-ACETAMINOPHEN 5-325 MG PO TABS
1.0000 | ORAL_TABLET | Freq: Four times a day (QID) | ORAL | Status: DC
  Administered 2016-04-04 – 2016-04-05 (×5): 1 via ORAL
  Filled 2016-04-04 (×5): qty 1

## 2016-04-04 MED ORDER — SODIUM CHLORIDE 0.9% FLUSH
10.0000 mL | INTRAVENOUS | Status: DC | PRN
  Administered 2016-04-04 – 2016-04-05 (×2): 10 mL
  Filled 2016-04-04 (×2): qty 40

## 2016-04-04 MED ORDER — HYDROXYZINE HCL 10 MG PO TABS
10.0000 mg | ORAL_TABLET | Freq: Four times a day (QID) | ORAL | Status: DC | PRN
  Filled 2016-04-04: qty 1

## 2016-04-04 MED ORDER — VANCOMYCIN HCL IN DEXTROSE 1-5 GM/200ML-% IV SOLN
1000.0000 mg | Freq: Three times a day (TID) | INTRAVENOUS | Status: DC
  Administered 2016-04-04 – 2016-04-05 (×4): 1000 mg via INTRAVENOUS
  Filled 2016-04-04 (×6): qty 200

## 2016-04-04 MED ORDER — HYDROCODONE-ACETAMINOPHEN 10-325 MG PO TABS: 1.0000 | ORAL_TABLET | Freq: Four times a day (QID) | ORAL | Status: DC | PRN
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
## 2016-04-04 NOTE — Progress Notes (Signed)
Subjective:  Jesus Daniel's hip pain is much improved and has not needed IV pain medication for the last 24 hours. His last BM was yesterday. He is requesting updates on his status and wants to know when he can go to rehab.  Objective: Vital signs in last 24 hours: Filed Vitals:   04/03/16 0300 04/03/16 1300 04/03/16 1953 04/04/16 0550  BP: 106/61 124/74 143/76 121/71  Pulse: 91  103 87  Temp: 97.9 F (36.6 C) 98.2 F (36.8 C) 98.4 F (36.9 C) 98.3 F (36.8 C)  TempSrc: Oral Oral Oral Oral  Resp: '20 20 18 16  ' Height:      Weight:      SpO2: 97% 97% 98% 99%   Weight change:   Intake/Output Summary (Last 24 hours) at 04/04/16 1118 Last data filed at 04/04/16 0551  Gross per 24 hour  Intake    440 ml  Output   1825 ml  Net  -1385 ml   Physical Exam: General: Lying in bed, NAD Cardiovascular: Systolic murmur, RRR Pulmonary: CTAB. breathing unlabored Abdominal: Soft, NT/ND. Normal Bowel sounds Extremities: Chronic venous stasis changes with non-pitting edema. No clubbing, cyanosis. Skin: No overlying ecchymosis on right hip MSK: Right hip movement limited by pain.  Neurological: Speech normal. Not tremulous.   Lab Results: Basic Metabolic Panel:  Recent Labs Lab 04/02/16 0310 04/03/16 0403 04/04/16 0558  NA 133* 133* 134*  K 3.3* 3.6 4.1  CL 95* 98* 96*  CO2 '28 28 30  ' GLUCOSE 103* 110* 103*  BUN <5* <5* <5*  CREATININE 0.61 0.64 0.65  CALCIUM 8.4* 8.4* 8.3*  MG 1.7  --  1.9   CBC:  Recent Labs Lab 03/31/16 1807 04/03/16 0403 04/04/16 0558  WBC 8.2 4.2 4.7  NEUTROABS 5.3  --   --   HGB 12.1* 9.5* 10.1*  HCT 36.6* 28.8* 29.9*  MCV 94.6 98.0 98.0  PLT 281 186 205    Studies/Results: Ct Aspiration  04/02/2016  INDICATION: 70 year old male with a history of concern for right hip infection. He has been referred for fluid aspiration. EXAM: CT GUIDANCE NEEDLE PLACEMENT MEDICATIONS: The patient is currently admitted to the hospital and receiving  intravenous antibiotics. The antibiotics were administered within an appropriate time frame prior to the initiation of the procedure. ANESTHESIA/SEDATION: Fentanyl 50 mcg IV; Versed 0 mg IV No moderate sedation with only 1 medication administered. COMPLICATIONS: None PROCEDURE: Informed written consent was obtained from the patient after a thorough discussion of the procedural risks, benefits and alternatives. All questions were addressed. Maximal Sterile Barrier Technique was utilized including caps, mask, sterile gowns, sterile gloves, sterile drape, hand hygiene and skin antiseptic. A timeout was performed prior to the initiation of the procedure. Patient was positioned supine position on CT gantry table and a scout CT of the pelvis was acquired for planning purposes. Once the patient is prepped and draped in the usual sterile fashion adjacent to the right iliac crest, the skin and subcutaneous tissues were generously infiltrated with 1% lidocaine for local anesthesia. A small stab incision was made with 11 blade scalpel. A Yueh needle was advanced into the fluid collection of the right iliacus musculature. Approximately 5 cc of complex fluid was aspirated. Amplatz wire was then placed through the Yueh needle which was exchanged for a 10 French drainage catheter. Drainage catheter was used to aspirate another approximately 35 cc of complex fluid. Sample was sent the lab for analysis. All drains were removed and a sterile bandage  was placed. Patient tolerated the procedure well and remained hemodynamically stable throughout. No complications were encountered and no significant blood loss encountered. IMPRESSION: Status post CT-guided aspiration of right pelvic fluid collection contiguous with the right hip process, with approximately 40 cc of complex serosanguineous fluid aspirated. Sample sent the lab for analysis. Signed, Dulcy Fanny. Earleen Newport, DO [facility]  Electronically Signed   By: Corrie Mckusick D.O.   On: 04/02/2016 11:48   Medications: I have reviewed the patient's current medications. Scheduled Meds: . hydrocerin   Topical BID  . HYDROcodone-acetaminophen  1 tablet Oral Q6H  . polyethylene glycol  17 g Oral Daily  . tamsulosin  0.4 mg Oral QPC supper  . vancomycin  1,000 mg Intravenous Q8H   Continuous Infusions:  PRN Meds:.hydrOXYzine, iohexol Assessment/Plan:  Extraarticular Right Hip Abscess: Fluid collection aspirate growing staph, awaiting sensitivities. He will require 6 weeks of antibiotics total, per ortho. I have spoken with Dr. Tommy Medal who recommends IV ancef or vancomycin depending on sensitivities, in addition to rifampin. He confirmed that they will formally consult. Ortho recommends non-operative management for now.  - Vancomycin per pharm (4/25 -), may transition to IV ancef depending on sensitivities - Rifampin 300 mg q12h (4/27 - ) - Repeat ESR, CRP 5-7 days from last reading (4/25) - Vicodin 5-325 1 tablet q6h scheduled for pain pain, d/c hydromorphone - CSW consult for SNF placement, prefers [facility]  Alcohol Use Disorder: Patient shows now withdrawal symptoms this AM. No CIWAs recorded.  - CIWA protocol - Banana bag   BPH: Tamsulosin 0.4 daily  DVT PPx: Lovenox [place]  Dispo: Disposition is deferred at this time, awaiting improvement of current medical problems.  Anticipated discharge in approximately 1-2 day(s) to SNF.  The patient does not have a current PCP (No primary care provider on file.) and does not need an [facility] hospital follow-up appointment after discharge.  The patient does have transportation limitations that hinder transportation to clinic appointments.  .Services Needed at time of discharge: Y = Yes, Blank = No PT:   OT:   RN:   Equipment:   Other:     LOS: 2 days   Liberty Handy, MD 04/04/2016, 11:18 AM

## 2016-04-04 NOTE — Progress Notes (Signed)
Subjective:     Patient reports pain as mild.  Doing much better today.  Pain has significantly decreased.    Objective: Vital signs in last 24 hours: Temp:  [98.2 F (36.8 C)-98.4 F (36.9 C)] 98.3 F (36.8 C) (04/27 0550) Pulse Rate:  [87-103] 87 (04/27 0550) Resp:  [16-20] 16 (04/27 0550) BP: (121-143)/(71-76) 121/71 mmHg (04/27 0550) SpO2:  [97 %-99 %] 99 % (04/27 0550)  Intake/Output from previous day: 04/26 0701 - 04/27 0700 In: 440 [P.O.:240; IV Piggyback:200] Out: 1825 [Urine:1825] Intake/Output this shift:     Recent Labs  04/03/16 0403  HGB 9.5*    Recent Labs  04/03/16 0403  WBC 4.2  RBC 2.94*  HCT 28.8*  PLT 186    Recent Labs  04/03/16 0403 04/04/16 0558  NA 133* 134*  K 3.6 4.1  CL 98* 96*  CO2 28 30  BUN <5* <5*  CREATININE 0.64 0.65  GLUCOSE 110* 103*  CALCIUM 8.4* 8.3*   No results for input(s): LABPT, INR in the last 72 hours.  Neurologically intact Neurovascular intact Sensation intact distally Intact pulses distally Dorsiflexion/Plantar flexion intact Compartment soft  RLE-able to actively SLR without pain.  I can log roll his hip with minimal pain.  Assessment/Plan:     Up with therapy  WBAT RLE We will continue to monitor ESR/CRP.  Currently, patient remains afebrile and is without an elevated wbc count We recommend ID consult with likely pic line placement and abx x 6 weeks. Once d/c from hospital, he will need SNF placement.  Unable to go home as he has no help and has sustained multiple falls.  Fannie Knee 04/04/2016, 7:21 AM

## 2016-04-04 NOTE — Progress Notes (Signed)
Peripherally Inserted Central Catheter/Midline Placement  The IV Nurse has discussed with the patient and/or persons authorized to consent for the patient, the purpose of this procedure and the potential benefits and risks involved with this procedure.  The benefits include less needle sticks, lab draws from the catheter and patient may be discharged home with the catheter.  Risks include, but not limited to, infection, bleeding, blood clot (thrombus formation), and puncture of an artery; nerve damage and irregular heat beat.  Alternatives to this procedure were also discussed.  PICC/Midline Placement Documentation        Jesus Daniel 04/04/2016, 2:49 PM

## 2016-04-05 ENCOUNTER — Ambulatory Visit: Payer: PPO | Admitting: Physical Therapy

## 2016-04-05 DIAGNOSIS — Z471 Aftercare following joint replacement surgery: Secondary | ICD-10-CM | POA: Diagnosis not present

## 2016-04-05 DIAGNOSIS — B9561 Methicillin susceptible Staphylococcus aureus infection as the cause of diseases classified elsewhere: Secondary | ICD-10-CM | POA: Diagnosis not present

## 2016-04-05 DIAGNOSIS — Z951 Presence of aortocoronary bypass graft: Secondary | ICD-10-CM | POA: Diagnosis not present

## 2016-04-05 DIAGNOSIS — F1010 Alcohol abuse, uncomplicated: Secondary | ICD-10-CM | POA: Diagnosis not present

## 2016-04-05 DIAGNOSIS — N400 Enlarged prostate without lower urinary tract symptoms: Secondary | ICD-10-CM | POA: Diagnosis not present

## 2016-04-05 DIAGNOSIS — Z9181 History of falling: Secondary | ICD-10-CM | POA: Diagnosis not present

## 2016-04-05 DIAGNOSIS — M25551 Pain in right hip: Secondary | ICD-10-CM | POA: Diagnosis not present

## 2016-04-05 DIAGNOSIS — R262 Difficulty in walking, not elsewhere classified: Secondary | ICD-10-CM | POA: Diagnosis not present

## 2016-04-05 DIAGNOSIS — M6281 Muscle weakness (generalized): Secondary | ICD-10-CM | POA: Diagnosis not present

## 2016-04-05 DIAGNOSIS — M109 Gout, unspecified: Secondary | ICD-10-CM | POA: Diagnosis not present

## 2016-04-05 DIAGNOSIS — B9562 Methicillin resistant Staphylococcus aureus infection as the cause of diseases classified elsewhere: Secondary | ICD-10-CM

## 2016-04-05 DIAGNOSIS — M25559 Pain in unspecified hip: Secondary | ICD-10-CM | POA: Diagnosis not present

## 2016-04-05 DIAGNOSIS — T8451XA Infection and inflammatory reaction due to internal right hip prosthesis, initial encounter: Secondary | ICD-10-CM | POA: Diagnosis not present

## 2016-04-05 DIAGNOSIS — Z5189 Encounter for other specified aftercare: Secondary | ICD-10-CM | POA: Diagnosis not present

## 2016-04-05 DIAGNOSIS — Z96641 Presence of right artificial hip joint: Secondary | ICD-10-CM | POA: Diagnosis not present

## 2016-04-05 DIAGNOSIS — R296 Repeated falls: Secondary | ICD-10-CM | POA: Diagnosis not present

## 2016-04-05 DIAGNOSIS — M00051 Staphylococcal arthritis, right hip: Secondary | ICD-10-CM

## 2016-04-05 DIAGNOSIS — L02415 Cutaneous abscess of right lower limb: Secondary | ICD-10-CM | POA: Diagnosis not present

## 2016-04-05 DIAGNOSIS — K219 Gastro-esophageal reflux disease without esophagitis: Secondary | ICD-10-CM | POA: Diagnosis not present

## 2016-04-05 LAB — CBC
HEMATOCRIT: 29.7 % — AB (ref 39.0–52.0)
Hemoglobin: 9.6 g/dL — ABNORMAL LOW (ref 13.0–17.0)
MCH: 31.5 pg (ref 26.0–34.0)
MCHC: 32.3 g/dL (ref 30.0–36.0)
MCV: 97.4 fL (ref 78.0–100.0)
Platelets: 230 10*3/uL (ref 150–400)
RBC: 3.05 MIL/uL — ABNORMAL LOW (ref 4.22–5.81)
RDW: 15.9 % — AB (ref 11.5–15.5)
WBC: 4.2 10*3/uL (ref 4.0–10.5)

## 2016-04-05 LAB — CREATININE, SERUM
CREATININE: 0.72 mg/dL (ref 0.61–1.24)
GFR calc Af Amer: >60 mL/min (ref >60)

## 2016-04-05 LAB — CULTURE, BODY FLUID W GRAM STAIN -BOTTLE

## 2016-04-05 LAB — HIV ANTIBODY (ROUTINE TESTING W REFLEX): HIV SCREEN 4TH GENERATION: NONREACTIVE

## 2016-04-05 LAB — CULTURE, BODY FLUID-BOTTLE

## 2016-04-05 LAB — MAGNESIUM: MAGNESIUM: 1.7 mg/dL (ref 1.7–2.4)

## 2016-04-05 LAB — CBC AND DIFFERENTIAL: WBC: 4.2 10*3/mL

## 2016-04-05 MED ORDER — HYDROCERIN EX CREA: 1.0000 "application " | TOPICAL_CREAM | Freq: Two times a day (BID) | CUTANEOUS | Status: DC

## 2016-04-05 MED ORDER — VANCOMYCIN HCL IN DEXTROSE 1-5 GM/200ML-% IV SOLN: 1000.0000 mg | Freq: Three times a day (TID) | INTRAVENOUS | Status: DC

## 2016-04-05 MED ORDER — HYDROCODONE-ACETAMINOPHEN 5-325 MG PO TABS: 1.0000 | ORAL_TABLET | Freq: Four times a day (QID) | ORAL | Status: DC | PRN

## 2016-04-05 MED ORDER — HYDROXYZINE HCL 10 MG PO TABS: 10.0000 mg | ORAL_TABLET | Freq: Four times a day (QID) | ORAL | Status: DC | PRN

## 2016-04-05 MED ORDER — ENOXAPARIN SODIUM 40 MG/0.4ML ~~LOC~~ SOLN: 40.0000 mg | SUBCUTANEOUS | Status: DC

## 2016-04-05 MED ORDER — POLYETHYLENE GLYCOL 3350 17 G PO PACK: 17.0000 g | PACK | Freq: Every day | ORAL | Status: DC

## 2016-04-05 NOTE — Clinical Social Work Note (Signed)
Clinical Social Work Assessment  Patient Details  Name: Jesus Daniel MRN: [id number] Date of Birth: December 15, 1945  Date of referral:  04/05/16               Reason for consult:  Facility Placement                Permission sought to share information with:  Chartered certified accountant granted to share information::  Yes, Verbal Permission Granted  Name::        Agency::  [facility]  Relationship::     Contact Information:     Housing/Transportation Living arrangements for the past 2 months:  [place] of Information:  Patient Patient Interpreter Needed:  None Criminal Activity/Legal Involvement Pertinent to Current Situation/Hospitalization:  No - Comment as needed Significant Relationships:  Other(Comment) (Did not disclose) Lives with:  Self Do you feel safe going back to the place where you live?  No Need for family participation in patient care:  No (Coment) (Patient able to make own decisions.)  Care giving concerns:  Patient expressed no concerns at this time.   Social Worker assessment / plan:  CSW received referral for possible SNF placement at time of discharge. Patient informed CSW patient will be discharged to [facility]. CSW confirmed patient able to admit to [facility] once medically stable for discharge. CSW to continue to follow and assist with discharge planning needs.  Employment status:  Retired Forensic scientist:  [employer]) PT Recommendations:  [place] / Referral to community resources:  [place]  Patient/Family's Response to care:  Patient understanding and agreeable to CSW plan of care.  Patient/Family's Understanding of and Emotional Response to Diagnosis, Current Treatment, and Prognosis:  Patient understanding and agreeable to CSW plan of care.  Emotional Assessment Appearance:  Appears stated  age Attitude/Demeanor/Rapport:  Other (Appropriate) Affect (typically observed):  Accepting, Appropriate, Pleasant Orientation:  Oriented to Self, Oriented to Place, Oriented to  Time, Oriented to Situation Alcohol / Substance use:  Not Applicable Psych involvement (Current and /or in the community):  No (Comment) (Not appropriate on this admission.)  Discharge Needs  Concerns to be addressed:  No discharge needs identified Readmission within the last 30 days:  No Current discharge risk:  None Barriers to Discharge:  No Barriers Identified   Caroline Sauger, LCSW 04/05/2016, 12:32 PM [phone]

## 2016-04-05 NOTE — Clinical Social Work Note (Signed)
Patient to be discharged to [facility]. Authorization for SNF: [id number] Patient to be transported via [place]. RN report number: [place], [place] Orthopedics: [phone] Surgical: [phone]
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
## 2016-04-05 NOTE — Progress Notes (Signed)
Subjective: No new complaints   Antibiotics:  Anti-infectives    Start     Dose/Rate Route Frequency Ordered Stop   04/05/16 0000  vancomycin (VANCOCIN) 1-5 GM/200ML-% SOLN  Status:  Discontinued     1,000 mg 200 mL/hr over 60 Minutes Intravenous Every 8 hours 04/05/16 1243 04/05/16    04/05/16 0000  vancomycin (VANCOCIN) 1-5 GM/200ML-% SOLN     1,000 mg 200 mL/hr over 60 Minutes Intravenous Every 8 hours 04/05/16 1246 05/17/16 2359   04/04/16 1130  vancomycin (VANCOCIN) IVPB 1000 mg/200 mL premix     1,000 mg 200 mL/hr over 60 Minutes Intravenous Every 8 hours 04/04/16 1105     04/04/16 1130  rifampin (RIFADIN) capsule 300 mg  Status:  Discontinued     300 mg Oral Every 12 hours 04/04/16 1123 04/04/16 1642   04/02/16 2300  vancomycin (VANCOCIN) IVPB 1000 mg/200 mL premix     1,000 mg 200 mL/hr over 60 Minutes Intravenous Every 8 hours 04/02/16 1351 04/04/16 0112   04/02/16 1500  vancomycin (VANCOCIN) 2,000 mg in sodium chloride 0.9 % 500 mL IVPB     2,000 mg 250 mL/hr over 120 Minutes Intravenous  Once 04/02/16 1347 04/02/16 1641      Medications: Scheduled Meds: . enoxaparin (LOVENOX) injection  40 mg Subcutaneous Q24H  . hydrocerin   Topical BID  . HYDROcodone-acetaminophen  1 tablet Oral Q6H  . polyethylene glycol  17 g Oral Daily  . tamsulosin  0.4 mg Oral QPC supper  . vancomycin  1,000 mg Intravenous Q8H   Continuous Infusions:  PRN Meds:.hydrOXYzine, iohexol, sodium chloride flush    Objective: Weight change:   Intake/Output Summary (Last 24 hours) at 04/05/16 1551 Last data filed at 04/05/16 1124  Gross per 24 hour  Intake      0 ml  Output    775 ml  Net   -775 ml   Blood pressure 121/64, pulse 78, temperature 97.4 F (36.3 C), temperature source Oral, resp. rate 16, height _0  (1.753 m), weight 220 lb (99.791 kg), SpO2 98 %. Temp:  [97.4 F (36.3 C)-98.4 F (36.9 C)] 97.4 F (36.3 C) (04/28 0640) Pulse Rate:  [78-99] 78 (04/28  0640) Resp:  [16] 16 (04/28 0640) BP: (121-128)/(64-69) 121/64 mmHg (04/28 0640) SpO2:  [98 %] 98 % (04/28 0640)  Physical Exam: General: Alert and awake, oriented x3, not in any acute distress. HEENT: anicteric sclera, EOMI, oropharynx clear and without exudate Cardiovascular: regular rate, normal r, no murmur rubs or gallops Pulmonary: clear to auscultation bilaterally, no wheezing, rales or rhonchi Gastrointestinal: soft nontender, nondistended, normal bowel sounds, Musculoskeletal: no clubbing or edema noted bilaterally Skin, multiple ecchymoses on skin Neuro: nonfocal, strength and sensation intact  CBC:  CBC Latest Ref Rng 04/05/2016 04/04/2016 04/03/2016  WBC 4.0 - 10.5 K/uL 4.2 4.7 4.2  Hemoglobin 13.0 - 17.0 g/dL 9.6(L) 10.1(L) 9.5(L)  Hematocrit 39.0 - 52.0 % 29.7(L) 29.9(L) 28.8(L)  Platelets 150 - 400 K/uL 230 205 186       BMET  Recent Labs  04/03/16 0403 04/04/16 0558 04/05/16 0500  NA 133* 134*  --   K 3.6 4.1  --   CL 98* 96*  --   CO2 28 30  --   GLUCOSE 110* 103*  --   BUN <5* <5*  --   CREATININE 0.64 0.65 0.72  CALCIUM 8.4* 8.3*  --      Liver Panel  No  results for input(s): PROT, ALBUMIN, AST, ALT, ALKPHOS, BILITOT, BILIDIR, IBILI in the last 72 hours.     Sedimentation Rate  Recent Labs  04/04/16 1605  ESRSEDRATE 104*   C-Reactive Protein  Recent Labs  04/04/16 1605  CRP 4.5*    Micro Results: Recent Results (from the past 720 hour(s))  MRSA PCR Screening     Status: None   Collection Time: 04/01/16 11:37 AM  Result Value Ref Range Status   MRSA by PCR NEGATIVE NEGATIVE Final    Comment:        The GeneXpert MRSA Assay (FDA approved for NASAL specimens only), is one component of a comprehensive MRSA colonization surveillance program. It is not intended to diagnose MRSA infection nor to guide or monitor treatment for MRSA infections.   Culture, body fluid-bottle     Status: Abnormal   Collection Time: 04/02/16  11:44 AM  Result Value Ref Range Status   Specimen Description FLUID RIGHT HIP  Final   Special Requests NONE  Final   Gram Stain   Final    GRAM POSITIVE COCCI IN CLUSTERS AEROBIC BOTTLE ONLY CRITICAL RESULT CALLED TO, READ BACK BY AND VERIFIED WITHWyn Quaker RN 1911 04/02/16 A BROWNING    Culture METHICILLIN RESISTANT STAPHYLOCOCCUS AUREUS (A)  Final   Report Status 04/05/2016 FINAL  Final   Organism ID, Bacteria METHICILLIN RESISTANT STAPHYLOCOCCUS AUREUS  Final      Susceptibility   Methicillin resistant staphylococcus aureus - MIC*    CIPROFLOXACIN >=8 RESISTANT Resistant     ERYTHROMYCIN >=8 RESISTANT Resistant     GENTAMICIN <=0.5 SENSITIVE Sensitive     OXACILLIN >=4 RESISTANT Resistant     TETRACYCLINE <=1 SENSITIVE Sensitive     VANCOMYCIN <=0.5 SENSITIVE Sensitive     TRIMETH/SULFA <=10 SENSITIVE Sensitive     CLINDAMYCIN >=8 RESISTANT Resistant     RIFAMPIN <=0.5 SENSITIVE Sensitive     Inducible Clindamycin NEGATIVE Sensitive     * METHICILLIN RESISTANT STAPHYLOCOCCUS AUREUS  Gram stain     Status: None   Collection Time: 04/02/16 11:44 AM  Result Value Ref Range Status   Specimen Description FLUID RIGHT HIP  Final   Special Requests NONE  Final   Gram Stain   Final    ABUNDANT WBC PRESENT,BOTH PMN AND MONONUCLEAR MODERATE GRAM POSITIVE COCCI IN PAIRS CRITICAL RESULT CALLED TO, READ BACK BY AND VERIFIED WITH: Karma Greaser RN 12:30 04/02/16 ([email or web address])    Report Status 04/02/2016 FINAL  Final    Studies/Results: No results found.    Assessment/Plan:  INTERVAL HISTORY:   MRSA isolated    Active Problems:   Right hip pain   Abscess of right hip   Fall   Prosthetic hip infection ([place])   Staphylococcus aureus infection    Jesus Daniel is a 70 y.o. 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     LOS: 3 days   Alcide Evener 04/05/2016, 3:51 PM

## 2016-04-05 NOTE — Progress Notes (Signed)
Physical Therapy Treatment Patient Details Name: Jesus Daniel MRN: [id number] DOB: 1946/09/20 Today's Date: 04/05/2016    History of Present Illness 70 yo admitted with Rt hip pain after 2 recent falls. 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  Pt c/o pain in R hip after performing B supine LE therapeutic exercise.  Pt educated on benefits of mobility and strengthening to improve balance and strength to decrease risk of falls in future.  Pt set to d/c this pm to [facility] for [place] [facility] rehab.    Follow Up Recommendations  SNF;Supervision for mobility/OOB     Equipment Recommendations  None recommended by PT    Recommendations for Other Services       Precautions / Restrictions Precautions Precautions: Fall Restrictions Weight Bearing Restrictions: Yes RLE Weight Bearing: Weight bearing as tolerated    Mobility  Bed Mobility Overal bed mobility: Needs Assistance Bed Mobility: Supine to Sit     Supine to sit: Mod assist;HOB elevated     General bed mobility comments: Pt required use of rail and PTA to provide HHA on R to advance to edge of bed.  Pt required mod assist to elevate trunk into sitting and mod assist to advance to edge of bed with bed pad.    Transfers Overall transfer level: Needs assistance Equipment used: Rolling walker (2 wheeled) Transfers: Sit to/from Stand Sit to Stand: Mod assist         General transfer comment: Pt required blocking of RW and assist to facilitate anterior translation into standing.  Pt stiff in B hips which limits hip flexion during transition.  Pt reports using lift chair at home.  Pt painful with movement and required increased time to transfer hand from edge of bed to standing.    Ambulation/Gait Ambulation/Gait assistance: Min guard;Min assist Ambulation Distance (Feet): 94 Feet Assistive device:  Rolling walker (2 wheeled) Gait Pattern/deviations: Step-through pattern;Trunk flexed;Wide base of support;Decreased stride length Gait velocity: Decreased   General Gait Details: Pt remains unsteady with gait and required min guard to min assist to steady during gait training.  Pt ambulates with ER of R hip which widens BOS.  Rounded spine during gait training with heavy reliance on UEs.  Pt provided with cues for posture, turns and backing to maintain safety.     Stairs            Wheelchair Mobility    Modified Rankin (Stroke Patients Only)       Balance                                    Cognition Arousal/Alertness: Awake/alert Behavior During Therapy: WFL for tasks assessed/performed Overall Cognitive Status: Within Functional Limits for tasks assessed                      Exercises Total Joint Exercises Ankle Circles/Pumps: AROM;Both;20 reps;Supine Quad Sets: AROM;Right;10 reps;Supine Gluteal Sets: AROM;Both;10 reps;Supine Heel Slides: AROM;Both;10 reps;Supine Hip ABduction/ADduction: AROM;Both;10 reps;Supine Straight Leg Raises: AROM;Both;10 reps;Supine;AAROM (required aarom with R LE.  )    General Comments        Pertinent Vitals/Pain Pain Assessment: 0-10 Pain Score: 8  Pain Location: R hip Pain Descriptors / Indicators: Discomfort;Tightness;Sore;Throbbing Pain Intervention(s): Monitored during session;Repositioned;Patient requesting pain meds-RN  notified    Home Living                      Prior Function            PT Goals (current goals can now be found in the care plan section) Acute Rehab PT Goals Patient Stated Goal: Get back to walking like he was Potential to Achieve Goals: Good Progress towards PT goals: Progressing toward goals    Frequency  Min 3X/week    PT Plan Current plan remains appropriate    Co-evaluation             End of Session Equipment Utilized During Treatment: Gait  belt Activity Tolerance: Patient limited by fatigue;Patient limited by pain Patient left: with call bell/phone within reach;in bed     Time: [id number] PT Time Calculation (min) (ACUTE ONLY): 32 min  Charges:  $Gait Training: 8-22 mins $Therapeutic Exercise: 8-22 mins                    G Codes:      Cristela Blue 04/14/2016, 2:44 PM  Governor Rooks, PTA pager [phone]

## 2016-04-05 NOTE — Progress Notes (Signed)
Subjective:     Patient reports pain as mild.  Patient very agitated and rude this am.  Upset that he is still in hospital.    Objective: Vital signs in last 24 hours: Temp:  [97.4 F (36.3 C)-98.6 F (37 C)] 97.4 F (36.3 C) (04/28 0640) Pulse Rate:  [78-99] 78 (04/28 0640) Resp:  [16-18] 16 (04/28 0640) BP: (121-128)/(64-74) 121/64 mmHg (04/28 0640) SpO2:  [98 %-100 %] 98 % (04/28 0640)  Intake/Output from previous day: 04/27 0701 - 04/28 0700 In: -  Out: 1275 [Urine:1275] Intake/Output this shift:     Recent Labs  04/03/16 0403 04/04/16 0558 04/05/16 0500  HGB 9.5* 10.1* 9.6*    Recent Labs  04/04/16 0558 04/05/16 0500  WBC 4.7 4.2  RBC 3.05* 3.05*  HCT 29.9* 29.7*  PLT 205 230    Recent Labs  04/03/16 0403 04/04/16 0558 04/05/16 0500  NA 133* 134*  --   K 3.6 4.1  --   CL 98* 96*  --   CO2 28 30  --   BUN <5* <5*  --   CREATININE 0.64 0.65 0.72  GLUCOSE 110* 103*  --   CALCIUM 8.4* 8.3*  --    No results for input(s): LABPT, INR in the last 72 hours.  Neurologically intact Neurovascular intact Sensation intact distally Intact pulses distally Dorsiflexion/Plantar flexion intact Compartment soft  Assessment/Plan:     Up with therapy  WBAT RLE Continues to be afebrile with normal wbc count Continue abx per medicine/ID Sed rate slightly elevated from a few days ago.  crp decreased. Once d/c, must go to SNF   Fannie Knee 04/05/2016, 8:05 AM

## 2016-04-05 NOTE — Discharge Instructions (Signed)
Jesus Daniel,  It was a pleasure taking care of you in the hospital.  You were here because you had an infection of your right hip. We have managed this with pain medication and IV antibiotics. You will benefit from time in rehab, so you are returning to [name].  You will require IV antibiotics for at least 6 weeks. Therefore, it is very important that you have an outpatient appointment with the infectious disease doctors. If you do not have an appointment with them in the next few days, please call [phone] to set this up.  If you have a fever, chills, or worsening hip pain, please seek medical attention. If you have new chest pain or shortness of breath, please seek medical attention.

## 2016-04-05 NOTE — Clinical Social Work Placement (Signed)
   CLINICAL SOCIAL WORK PLACEMENT  NOTE  Date:  04/05/2016  Patient Details  Name: MASSON REMILLARD MRN: [id number] Date of Birth: 09-02-1946  Clinical Social Work is seeking post-discharge placement for this patient at the [place] level of care (*CSW will initial, date and re-position this form in  chart as items are completed):  Yes   Patient/family provided with [facility] Work Department's list of facilities offering this level of care within the geographic area requested by the patient (or if unable, by the patient's family).  Yes   Patient/family informed of their freedom to choose among providers that offer the needed level of care, that participate in Medicare, Medicaid or managed care program needed by the patient, have an available bed and are willing to accept the patient.  Yes   Patient/family informed of [facility]'s ownership interest in [facility] and [facility], as well as of the fact that they are under no obligation to receive care at these facilities.  PASRR submitted to EDS on 04/05/16     PASRR number received on 04/05/16     Existing PASRR number confirmed on       FL2 transmitted to all facilities in geographic area requested by pt/family on 04/05/16     FL2 transmitted to all facilities within larger geographic area on       Patient informed that his/her managed care company has contracts with or will negotiate with certain facilities, including the following:        Yes   Patient/family informed of bed offers received.  Patient chooses bed at [facility]     Physician recommends and patient chooses bed at      Patient to be transferred to [facility] on 04/05/16.  Patient to be transferred to facility by [facility]     Patient family notified on 04/05/16 of transfer.  Name of family member notified:  Patient     PHYSICIAN       Additional Comment:     _______________________________________________ Caroline Sauger, LCSW 04/05/2016, 12:37 PM

## 2016-04-05 NOTE — Progress Notes (Signed)
Subjective:  [name]'s hip pain continues to improve today on IV antibiotics and pain management. He denies any chest pain or shortness of breath. He is looking forward to going to a rehab facility.  Objective: Vital signs in last 24 hours: Filed Vitals:   04/04/16 0550 04/04/16 1430 04/04/16 2114 04/05/16 0640  BP: 121/71 128/74 128/69 121/64  Pulse: 87 89 99 78  Temp: 98.3 F (36.8 C) 98.6 F (37 C) 98.4 F (36.9 C) 97.4 F (36.3 C)  TempSrc: Oral  Oral Oral  Resp: '16 18 16 16  ' Height:      Weight:      SpO2: 99% 100% 98% 98%   Weight change:   Intake/Output Summary (Last 24 hours) at 04/05/16 1207 Last data filed at 04/05/16 1124  Gross per 24 hour  Intake      0 ml  Output   1475 ml  Net  -1475 ml   Physical Exam: General: Lying in bed, NAD Cardiovascular: Systolic murmur, RRR Pulmonary: CTAB. breathing unlabored Abdominal: Soft, NT/ND. Normal Bowel sounds Extremities: Chronic venous stasis changes with non-pitting edema. No clubbing, cyanosis. Skin: No overlying ecchymosis on right hip MSK: Right hip movement limited by pain.  Neurological: Speech normal. Not tremulous.   Lab Results: Basic Metabolic Panel:  Recent Labs Lab 04/03/16 0403 04/04/16 0558 04/05/16 0500  NA 133* 134*  --   K 3.6 4.1  --   CL 98* 96*  --   CO2 28 30  --   GLUCOSE 110* 103*  --   BUN <5* <5*  --   CREATININE 0.64 0.65 0.72  CALCIUM 8.4* 8.3*  --   MG  --  1.9 1.7   CBC:  Recent Labs Lab 03/31/16 1807  04/04/16 0558 04/05/16 0500  WBC 8.2  < > 4.7 4.2  NEUTROABS 5.3  --   --   --   HGB 12.1*  < > 10.1* 9.6*  HCT 36.6*  < > 29.9* 29.7*  MCV 94.6  < > 98.0 97.4  PLT 281  < > 205 230  < > = values in this interval not displayed.  Studies/Results: No results found. Medications: I have reviewed the patient's current medications. Scheduled Meds: . enoxaparin (LOVENOX) injection  40 mg Subcutaneous Q24H  . hydrocerin   Topical BID  .  HYDROcodone-acetaminophen  1 tablet Oral Q6H  . polyethylene glycol  17 g Oral Daily  . tamsulosin  0.4 mg Oral QPC supper  . vancomycin  1,000 mg Intravenous Q8H   Continuous Infusions:  PRN Meds:.hydrOXYzine, iohexol, sodium chloride flush Assessment/Plan:  MRSA Abscess of Right Hip: Fluid collection aspirate growing MRSA. He will require 6 weeks of antibiotics total, per ortho, but may need a prolonged course and future operative management after consulting with infectious disease specialist, Dr. Tommy Medal. Not a good candidate for rifampin given history of alcohol use. In particular, the loosening of the acetabular component is worrisome for infection. He will be followed up in the infectious disease clinic. Ortho recommends non-operative management for now. He is stable for discharge today to a rehab facilities. ESR increased, CRP decreased from previous. - Vancomycin per pharm (4/25 -) - Vicodin 5-325 1 tablet q6h scheduled for pain pain, switch to prn on discharge - Awaiting SNF placement  Alcohol Use Disorder: Patient shows now withdrawal symptoms this AM. No CIWAs recorded.  - CIWA protocol - Banana bag   BPH: Tamsulosin 0.4 daily  DVT PPx: Lovenox [place]  Dispo: Anticipated discharge to SNF today or tomorrow.  The patient does have transportation limitations that hinder transportation to clinic appointments.  .Services Needed at time of discharge: Y = Yes, Blank = No PT:   OT:   RN:   Equipment:   Other:     LOS: 3 days   Liberty Handy, MD 04/05/2016, 12:07 PM

## 2016-04-05 NOTE — Clinical Social Work Note (Signed)
Patient to be discharged to [facility]. Patient updated regarding discharge. Patient to be transported via [facility]. RN report number: [place], [place] Orthopedics: [phone] Surgical: [phone]

## 2016-04-06 LAB — HCV COMMENT:

## 2016-04-06 LAB — HEPATITIS C ANTIBODY (REFLEX): HCV Ab: 0.2 s/co ratio (ref 0.0–0.9)

## 2016-04-08 ENCOUNTER — Encounter: Payer: Self-pay | Admitting: Adult Health

## 2016-04-08 ENCOUNTER — Non-Acute Institutional Stay: Payer: PPO | Admitting: Adult Health

## 2016-04-08 DIAGNOSIS — Z471 Aftercare following joint replacement surgery: Secondary | ICD-10-CM | POA: Diagnosis not present

## 2016-04-08 DIAGNOSIS — A4901 Methicillin susceptible Staphylococcus aureus infection, unspecified site: Secondary | ICD-10-CM

## 2016-04-08 DIAGNOSIS — E876 Hypokalemia: Secondary | ICD-10-CM

## 2016-04-08 DIAGNOSIS — M25551 Pain in right hip: Secondary | ICD-10-CM | POA: Diagnosis not present

## 2016-04-08 DIAGNOSIS — M6281 Muscle weakness (generalized): Secondary | ICD-10-CM | POA: Diagnosis not present

## 2016-04-08 DIAGNOSIS — K5901 Slow transit constipation: Secondary | ICD-10-CM | POA: Diagnosis not present

## 2016-04-08 DIAGNOSIS — M109 Gout, unspecified: Secondary | ICD-10-CM | POA: Diagnosis not present

## 2016-04-08 DIAGNOSIS — Z5189 Encounter for other specified aftercare: Secondary | ICD-10-CM | POA: Diagnosis not present

## 2016-04-08 DIAGNOSIS — Z951 Presence of aortocoronary bypass graft: Secondary | ICD-10-CM | POA: Diagnosis not present

## 2016-04-08 DIAGNOSIS — R531 Weakness: Secondary | ICD-10-CM

## 2016-04-08 DIAGNOSIS — Z789 Other specified health status: Secondary | ICD-10-CM

## 2016-04-08 DIAGNOSIS — M1611 Unilateral primary osteoarthritis, right hip: Secondary | ICD-10-CM

## 2016-04-08 DIAGNOSIS — R262 Difficulty in walking, not elsewhere classified: Secondary | ICD-10-CM | POA: Diagnosis not present

## 2016-04-08 DIAGNOSIS — D638 Anemia in other chronic diseases classified elsewhere: Secondary | ICD-10-CM

## 2016-04-08 DIAGNOSIS — Z7289 Other problems related to lifestyle: Secondary | ICD-10-CM

## 2016-04-08 DIAGNOSIS — R600 Localized edema: Secondary | ICD-10-CM | POA: Diagnosis not present

## 2016-04-08 DIAGNOSIS — L02415 Cutaneous abscess of right lower limb: Secondary | ICD-10-CM | POA: Diagnosis not present

## 2016-04-08 DIAGNOSIS — F1090 Alcohol use, unspecified, uncomplicated: Secondary | ICD-10-CM

## 2016-04-08 DIAGNOSIS — N400 Enlarged prostate without lower urinary tract symptoms: Secondary | ICD-10-CM | POA: Diagnosis not present

## 2016-04-08 DIAGNOSIS — K219 Gastro-esophageal reflux disease without esophagitis: Secondary | ICD-10-CM | POA: Diagnosis not present

## 2016-04-08 NOTE — Progress Notes (Signed)
Patient ID: Jesus Daniel, male   DOB: [birth year], 70 y.o.   MRN: [id number]    DATE:  04/08/2016   MRN:  [id number]  BIRTHDAY: 12/14/1945  Facility:  [facility]urs[facility] Home Location:  [place] and [place] Room Number: 1006-1  LEVEL OF CARE:  SNF (31)  Contact Information    Name Relation Home Work Mobile   [name] Son   [phone]   [name], [name]   [phone]       Code Status History    Date Active Date Inactive Code Status Order ID Comments User Context   03/31/2016  5:50 PM 04/05/2016  7:58 PM Full Code [id number]  Riccardo Dubin, MD Inpatient   10/18/2015 12:48 PM 10/20/2015  6:57 PM Full Code [id number]  Aundra Dubin, PA-C Inpatient       Chief Complaint  Patient presents with  . Hospitalization Follow-up    HISTORY OF PRESENT ILLNESS:  This is a 70 year old male who has been admitted to [facility] on 04/05/16 from [facility]. 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 He has been having multiple falls and pain on his right hip. CT right hip demonstrated extraarticular fluid collection, infection and loosening of the acetabular component of the hip prosthesis. He had a CT guided aspiration of right hip fluid collection demonstrated staph aureus, which was MRSA. He was treated with IV Vancomycin which was continued after the sensitivities were confirmed. Orthopedic surgery was consulted who recommended non-operative management, and joint aspiration was deferred to prevent seeding of the joint by any remaining abscess. Infectious Disease was consulted, who recommended at least 6 weeks of IV Vancomycin and antibiotics indefinitely pending operative management.   He has indicated that he drinks 6 beers a day but did not exhibit signs of alcohol withdrawal in the hospital.  He has been admitted for a short-term rehabilitation.    PAST MEDICAL HISTORY:  Past Medical History   Diagnosis Date  . GERD (gastroesophageal reflux disease)   . Depression   . Anxiety   . Cancer ([place])     skin  . Urinary hesitancy   . Arthritis     "probably in my knees" (10/18/2015)  . History of gout   . MRSA (methicillin resistant staph aureus) culture positive     Right hip abscess  . Alcohol use disorder ([place])   . BPH (benign prostatic hyperplasia)      CURRENT MEDICATIONS: Reviewed  Patient's Medications  New Prescriptions   No medications on file  Previous Medications   ENOXAPARIN (LOVENOX) 40 MG/0.4ML INJECTION    Inject 0.4 mLs (40 mg total) into the skin daily.   FUROSEMIDE (LASIX) 40 MG TABLET    Take 40 mg by mouth 2 (two) times daily as needed for fluid.    HEPARIN 5,000 UNITS IN SODIUM CHLORIDE 0.9 % 50 ML    Inject 10 mLs as directed 2 (two) times daily.   HEPARIN SOD, PORCINE, IN D5W (HEPARIN SODIUM/D5W) 50-5 UNIT/ML-% SOLN    Inject into the vein 2 (two) times daily. Via PICC   HYDROCERIN (EUCERIN) CREA    Apply 1 application topically 2 (two) times daily.   HYDROCODONE-ACETAMINOPHEN (NORCO/VICODIN) 5-325 MG TABLET    Take 1 tablet by mouth every 6 (six) hours as needed for moderate pain.   HYDROXYZINE (ATARAX/VISTARIL) 10 MG TABLET    Take 1 tablet (10 mg total) by mouth every  6 (six) hours as needed for itching.   POLYETHYLENE GLYCOL (MIRALAX / GLYCOLAX) PACKET    Take 17 g by mouth daily.   POTASSIUM CHLORIDE SA (K-DUR,KLOR-CON) 20 MEQ TABLET    Take 20 mEq by mouth daily.   SODIUM CHLORIDE FLUSH (NORMAL SALINE FLUSH IV)    Inject 10 mLs into the vein. Before and after IV antibiotics   TAMSULOSIN (FLOMAX) 0.4 MG CAPS CAPSULE    Take 0.4 mg by mouth daily.   VANCOMYCIN (VANCOCIN) 1-5 GM/200ML-% SOLN    Inject 200 mLs (1,000 mg total) into the vein every 8 (eight) hours. Administer until May 17, 2016  Modified Medications   No medications on file  Discontinued Medications   No medications on file     No Known Allergies   REVIEW OF SYSTEMS:  GENERAL:  no change in appetite, no fatigue, no weight changes, no fever, chills or weakness SKIN: Denies rash, itching, wounds, ulcer sores, or nail abnormality EYES: Denies change in vision, dry eyes, eye pain, itching or discharge EARS: Denies change in hearing, ringing in ears, or earache NOSE: Denies nasal congestion or epistaxis MOUTH and THROAT: Denies oral discomfort, gingival pain or bleeding, pain from teeth or hoarseness   RESPIRATORY: no cough, SOB, DOE, wheezing, hemoptysis CARDIAC: no chest pain, edema or palpitations GI: no abdominal pain, diarrhea, constipation, heart burn, nausea or vomiting GU: Denies dysuria, frequency, hematuria, incontinence, or discharge PSYCHIATRIC: Denies feeling of depression or anxiety. No report of hallucinations, insomnia, paranoia, or agitation   PHYSICAL EXAMINATION:  GENERAL APPEARANCE: Well nourished. In no acute distress. Normal body habitus SKIN:  Right hip scar from surgery HEAD: Normal in size and contour. No evidence of trauma EYES: Right eye prosthesis EARS: Pinnae are normal. Patient hears normal voice tunes of the examiner MOUTH and THROAT: Lips are without lesions. Oral mucosa is moist and without lesions. Tongue is normal in shape, size, and color and without lesions NECK: supple, trachea midline, no neck masses, no thyroid tenderness, no thyromegaly LYMPHATICS: no LAN in the neck, no supraclavicular LAN RESPIRATORY: breathing is even & unlabored, BS CTAB CARDIAC: RRR, no murmur,no extra heart sounds, BLE edema 1+; right upper arm SL PICC GI: abdomen soft, normal BS, no masses, no tenderness, no hepatomegaly, no splenomegaly EXTREMITIES:  Able to move X 4 extremities PSYCHIATRIC: Alert and oriented X 3. Affect and behavior are appropriate  LABS/RADIOLOGY: Labs reviewed: Basic Metabolic Panel:  Recent Labs  04/02/16 0310 04/03/16 0403 04/04/16 0558 04/05/16 0500  NA 133* 133* 134*  --   K 3.3* 3.6 4.1  --   CL 95* 98* 96*  --    CO2 28 28 30   --   GLUCOSE 103* 110* 103*  --   BUN <5* <5* <5*  --   CREATININE 0.61 0.64 0.65 0.72  CALCIUM 8.4* 8.4* 8.3*  --   MG 1.7  --  1.9 1.7   Liver Function Tests:  Recent Labs  10/09/15 1159  AST 34  ALT 11*  ALKPHOS 101  BILITOT 0.7  PROT 7.0  ALBUMIN 2.8*   CBC:  Recent Labs  10/09/15 1159  03/31/16 1807 04/03/16 0403 04/04/16 0558 04/05/16 0500  WBC 7.4  < > 8.2 4.2 4.7 4.2  NEUTROABS 5.1  --  5.3  --   --   --   HGB 10.1*  < > 12.1* 9.5* 10.1* 9.6*  HCT 31.7*  < > 36.6* 28.8* 29.9* 29.7*  MCV 99.1  < > 94.6  98.0 98.0 97.4  PLT 311  < > 281 186 205 230  < > = values in this interval not displayed.   Dg Pelvis 1-2 Views  03/31/2016  CLINICAL DATA:  Right hip pain, multiple recent falls, prior right hip replacement in November 2016 EXAM: PELVIS - 1-2 VIEW COMPARISON:  10/18/2015 FINDINGS: Right hip arthroplasty in satisfactory position. No evidence of hardware fracture or loosening. Associated heterotopic calcification. Mild degenerative changes of the left hip. No fracture or dislocation is seen. Visualized bony pelvis appears intact. Mild degenerative changes the lower lumbar spine IMPRESSION: Right hip arthroplasty in satisfactory position. No evidence of complication. No fracture or dislocation is seen. Electronically Signed   By: Julian Hy M.D.   On: 03/31/2016 14:29   Ct Hip Right Wo Contrast  03/31/2016  CLINICAL DATA:  Status post fall 6 days ago. Severe right hip pain. The patient is unable to walk. EXAM: CT OF THE RIGHT HIP WITHOUT CONTRAST TECHNIQUE: Multidetector CT imaging of the right hip was performed according to the standard protocol. Multiplanar CT image reconstructions were also generated. COMPARISON:  Plain film of the pelvis earlier today. Scratch the single view of the pelvis earlier today and 10/18/2015. CT pelvis 07/04/2015. FINDINGS: Right total hip arthroplasty is in place. There is extensive osteolysis about the acetabular  component which is new since the prior study. The medial wall of the acetabulum is markedly thinned and fragmented, new since the most recent examination. Extensive heterotopic calcification is present about the device. Lucency is seen about both anchoring screws in the acetabulum. There is also some lucency about the proximal aspect of the femoral component. The device is located and no fracture is identified. Extensive stranding is present in subcutaneous fatty tissues about the right hip. The right iliacus muscle is markedly swollen and there appears to be a fluid collection within it measuring approximately 3.7 cm transverse by 4.8 cm AP. The craniocaudal dimension of the collection cannot be assessed on this study as it is not entirely included. A collection in the shallow subcutaneous soft tissues measures approximately 2.2 cm AP x 1.7 cm transverse by 3.8 cm craniocaudal. Imaged musculature of the pelvis demonstrates some atrophy. Extensive atherosclerotic vascular disease is noted. IMPRESSION: Right hip arthroplasty in place with extensive ostial license, heterotopic calcification and loosening of the acetabular component. Fluid collection in the right iliacus muscle is incompletely visualized. Findings are most worrisome for infection and abscess formation in the iliacus. Particle disease is possible but thought less likely. Extensive stranding in subcutaneous fatty tissues over the right hip with a likely subcutaneous fluid collection worrisome for abscess as described above. Negative for fracture. Electronically Signed   By: Inge Rise M.D.   On: 03/31/2016 17:07   Ct Aspiration  04/02/2016  INDICATION: 70 year old male with a history of concern for right hip infection. He has been referred for fluid aspiration. EXAM: CT GUIDANCE NEEDLE PLACEMENT MEDICATIONS: The patient is currently admitted to the hospital and receiving intravenous antibiotics. The antibiotics were administered within an  appropriate time frame prior to the initiation of the procedure. ANESTHESIA/SEDATION: Fentanyl 50 mcg IV; Versed 0 mg IV No moderate sedation with only 1 medication administered. COMPLICATIONS: None PROCEDURE: Informed written consent was obtained from the patient after a thorough discussion of the procedural risks, benefits and alternatives. All questions were addressed. Maximal Sterile Barrier Technique was utilized including caps, mask, sterile gowns, sterile gloves, sterile drape, hand hygiene and skin antiseptic. A timeout was performed prior to  the initiation of the procedure. Patient was positioned supine position on CT gantry table and a scout CT of the pelvis was acquired for planning purposes. Once the patient is prepped and draped in the usual sterile fashion adjacent to the right iliac crest, the skin and subcutaneous tissues were generously infiltrated with 1% lidocaine for local anesthesia. A small stab incision was made with 11 blade scalpel. A Yueh needle was advanced into the fluid collection of the right iliacus musculature. Approximately 5 cc of complex fluid was aspirated. Amplatz wire was then placed through the Yueh needle which was exchanged for a 10 French drainage catheter. Drainage catheter was used to aspirate another approximately 35 cc of complex fluid. Sample was sent the lab for analysis. All drains were removed and a sterile bandage was placed. Patient tolerated the procedure well and remained hemodynamically stable throughout. No complications were encountered and no significant blood loss encountered. IMPRESSION: Status post CT-guided aspiration of right pelvic fluid collection contiguous with the right hip process, with approximately 40 cc of complex serosanguineous fluid aspirated. Sample sent the lab for analysis. Signed, Dulcy Fanny. Earleen Newport, DO [facility] Electronically Signed   By: Corrie Mckusick D.O.   On: 04/02/2016 11:48    Dg Femur, Min 2 Views Right  03/31/2016  CLINICAL DATA:  Right hip pain, multiple falls, status post right hip arthroplasty in 10/2015 EXAM: RIGHT FEMUR 2 VIEWS COMPARISON:  None. FINDINGS: Right hip arthroplasty in satisfactory position. No evidence of complication. Heterotopic calcification on the right hip prosthesis. No fracture or dislocation is seen. Mild tricompartmental degenerative changes of the knee. No suprapatellar knee joint effusion. Vascular calcifications. IMPRESSION: Right hip arthroplasty, without evidence of complication. No fracture or dislocation is seen. Electronically Signed   By: Julian Hy M.D.   On: 03/31/2016 14:31    ASSESSMENT/PLAN:  Generalized weakness - for rehabilitation   Right hip MRSA abscess - continue Vancomycin 1,000 mg IV Q 8 hours till May 17, 2016; follow-up with Infectious Disease for antibiotic management and possible oral antibiotics for MRSA after IV Vancomycin; RLE WBAT   Osteoarthritis of right hip S/P right hip arthroplasty - for rehabilitation; continue Norco 5/325 mg 1 tab PO Q 6 hours PRN for pain; Lovenox 40 mg SQ daily for DVT prophylaxis  Lower extremity edema - continue Lasix 40 mg 1 tab PO BID PRN  Constipation - continue Miralax 17 gm daily  Hypokalemia - K 4.1; decrease K-Dur from 20 meq to 10 meq PO daily; check CMP  BPH - continue Flomax 0.4 mg 1 capsule daily  Anemia of chronic disease - hgb 9.6, mcv 97.4; check CBC  Alcohol use disorder - monitor for withdrawal; counseling regarding alcohol cessation     Goals of care:  Short-term rehabilitation    [facility], [place]

## 2016-04-09 ENCOUNTER — Non-Acute Institutional Stay (SKILLED_NURSING_FACILITY): Payer: PPO | Admitting: Internal Medicine

## 2016-04-09 ENCOUNTER — Encounter: Payer: Self-pay | Admitting: Internal Medicine

## 2016-04-09 DIAGNOSIS — A4902 Methicillin resistant Staphylococcus aureus infection, unspecified site: Secondary | ICD-10-CM | POA: Diagnosis not present

## 2016-04-09 DIAGNOSIS — N400 Enlarged prostate without lower urinary tract symptoms: Secondary | ICD-10-CM

## 2016-04-09 DIAGNOSIS — R600 Localized edema: Secondary | ICD-10-CM | POA: Diagnosis not present

## 2016-04-09 DIAGNOSIS — D649 Anemia, unspecified: Secondary | ICD-10-CM | POA: Diagnosis not present

## 2016-04-09 DIAGNOSIS — D638 Anemia in other chronic diseases classified elsewhere: Secondary | ICD-10-CM

## 2016-04-09 DIAGNOSIS — R195 Other fecal abnormalities: Secondary | ICD-10-CM

## 2016-04-09 DIAGNOSIS — Z96641 Presence of right artificial hip joint: Secondary | ICD-10-CM | POA: Diagnosis not present

## 2016-04-09 DIAGNOSIS — I10 Essential (primary) hypertension: Secondary | ICD-10-CM | POA: Diagnosis not present

## 2016-04-09 DIAGNOSIS — E876 Hypokalemia: Secondary | ICD-10-CM | POA: Diagnosis not present

## 2016-04-09 DIAGNOSIS — R5381 Other malaise: Secondary | ICD-10-CM | POA: Diagnosis not present

## 2016-04-09 LAB — HEPATIC FUNCTION PANEL
ALT: 17 U/L (ref 10–40)
AST: 31 U/L (ref 14–40)
Alkaline Phosphatase: 112 U/L (ref 25–125)
Bilirubin, Total: 0.4 mg/dL

## 2016-04-09 LAB — CBC AND DIFFERENTIAL
HCT: 30 % — AB (ref 41–53)
Hemoglobin: 9.8 g/dL — AB (ref 13.5–17.5)
NEUTROS ABS: 2 /uL
Platelets: 226 10*3/uL (ref 150–399)
WBC: 4.1 10^3/mL

## 2016-04-09 LAB — BASIC METABOLIC PANEL
BUN: 6 mg/dL (ref 4–21)
CREATININE: 0.8 mg/dL (ref 0.6–1.3)
GLUCOSE: 79 mg/dL
POTASSIUM: 3.9 mmol/L (ref 3.4–5.3)
Sodium: 139 mmol/L (ref 137–147)

## 2016-04-09 LAB — POCT ERYTHROCYTE SEDIMENTATION RATE, NON-AUTOMATED: SED RATE: 75 mm
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
## 2016-04-10 ENCOUNTER — Encounter: Payer: Self-pay | Admitting: Adult Health

## 2016-04-10 ENCOUNTER — Non-Acute Institutional Stay (SKILLED_NURSING_FACILITY): Payer: PPO | Admitting: Adult Health

## 2016-04-10 DIAGNOSIS — Z981 Arthrodesis status: Secondary | ICD-10-CM | POA: Diagnosis not present

## 2016-04-10 DIAGNOSIS — D649 Anemia, unspecified: Secondary | ICD-10-CM | POA: Diagnosis not present

## 2016-04-10 DIAGNOSIS — G8929 Other chronic pain: Secondary | ICD-10-CM | POA: Diagnosis not present

## 2016-04-10 DIAGNOSIS — M6281 Muscle weakness (generalized): Secondary | ICD-10-CM | POA: Diagnosis not present

## 2016-04-10 DIAGNOSIS — R262 Difficulty in walking, not elsewhere classified: Secondary | ICD-10-CM | POA: Diagnosis not present

## 2016-04-10 DIAGNOSIS — E43 Unspecified severe protein-calorie malnutrition: Secondary | ICD-10-CM

## 2016-04-10 DIAGNOSIS — L02415 Cutaneous abscess of right lower limb: Secondary | ICD-10-CM | POA: Diagnosis not present

## 2016-04-10 DIAGNOSIS — M25551 Pain in right hip: Secondary | ICD-10-CM | POA: Diagnosis not present

## 2016-04-10 DIAGNOSIS — R2681 Unsteadiness on feet: Secondary | ICD-10-CM | POA: Diagnosis not present

## 2016-04-10 DIAGNOSIS — Z5189 Encounter for other specified aftercare: Secondary | ICD-10-CM | POA: Diagnosis not present

## 2016-04-10 NOTE — Progress Notes (Addendum)
Patient ID: Jesus Daniel, male   DOB: 01-Nov-1946, 70 y.o.   MRN: [id number]    DATE:  04/10/2016   MRN:  [id number]  BIRTHDAY: Mar 01, 1946  Facility:  [facility]ursing Home Location:  [place] Room Number: 907-P  LEVEL OF CARE:  SNF (31)  Contact Information    Name Relation Home Work Mobile   [place]" Son   [phone]   [place], [place]   [phone]       Code Status History    Date Active Date Inactive Code Status Order ID Comments User Context   03/31/2016  5:50 PM 04/05/2016  7:58 PM Full Code [id number]  Riccardo Dubin, MD Inpatient   10/18/2015 12:48 PM 10/20/2015  6:57 PM Full Code [id number]  Aundra Dubin, PA-C Inpatient       Chief Complaint  Patient presents with  . Acute Visit    Protein calorie malnutrition    HISTORY OF PRESENT ILLNESS:  This is a 70 year old male who was noted to have albumin 2.44, low.  He has been admitted to [facility] on 04/05/16 from [facility]. 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 He has been having multiple falls and pain on his right hip. CT right hip demonstrated extraarticular fluid collection, infection and loosening of the acetabular component of the hip prosthesis. He had a CT guided aspiration of right hip fluid collection demonstrated staph aureus, which was MRSA. He was treated with IV Vancomycin which was continued after the sensitivities were confirmed. Orthopedic surgery was consulted who recommended non-operative management, and joint aspiration was deferred to prevent seeding of the joint by any remaining abscess. Infectious Disease was consulted, who recommended at least 6 weeks of IV Vancomycin and antibiotics indefinitely pending operative management.   He has indicated that he drinks 6 beers a day but did not exhibit signs of alcohol withdrawal in the hospital.  He has been admitted for a short-term  rehabilitation.  PAST MEDICAL HISTORY:  Past Medical History  Diagnosis Date  . GERD (gastroesophageal reflux disease)   . Depression   . Anxiety   . Cancer ([place])     skin  . Urinary hesitancy   . Arthritis     "probably in my knees" (10/18/2015)  . History of gout   . MRSA (methicillin resistant staph aureus) culture positive     Right hip abscess  . Alcohol use disorder ([place])   . BPH (benign prostatic hyperplasia)      CURRENT MEDICATIONS: Reviewed  Patient's Medications  New Prescriptions   No medications on file  Previous Medications   DOCUSATE SODIUM (COLACE) 100 MG CAPSULE    Take 100 mg by mouth daily.   ENOXAPARIN (LOVENOX) 40 MG/0.4ML INJECTION    Inject 0.4 mLs (40 mg total) into the skin daily.   FUROSEMIDE (LASIX) 40 MG TABLET    Take 40 mg by mouth 2 (two) times daily as needed for fluid.    HEPARIN 5,000 UNITS IN SODIUM CHLORIDE 0.9 % 50 ML    Inject 10 mLs as directed 2 (two) times daily.   HYDROCERIN (EUCERIN) CREA    Apply 1 application topically 2 (two) times daily.   HYDROCODONE-ACETAMINOPHEN (NORCO/VICODIN) 5-325 MG TABLET    Take 1 tablet by mouth every 4 (four) hours as needed for moderate pain.   HYDROXYZINE (ATARAX/VISTARIL) 10 MG TABLET    Take 1 tablet (10  mg total) by mouth every 6 (six) hours as needed for itching.   POLYETHYLENE GLYCOL (MIRALAX / GLYCOLAX) PACKET    Take 17 g by mouth daily as needed.   POTASSIUM CHLORIDE (K-DUR) 10 MEQ TABLET    Take 10 mEq by mouth daily.   PROTEIN (PROCEL) POWD    Take 2 scoop by mouth 2 (two) times daily.   SODIUM CHLORIDE FLUSH (NORMAL SALINE FLUSH IV)    Inject 10 mLs into the vein. Before and after IV antibiotics   TAMSULOSIN (FLOMAX) 0.4 MG CAPS CAPSULE    Take 0.4 mg by mouth daily.   VANCOMYCIN HCL IN NACL 1-0.9 GM/200ML-% SOLN    Inject into the vein every 8 (eight) hours. Until 05/17/2016  Modified Medications   No medications on file  Discontinued Medications   HEPARIN SOD, PORCINE, IN D5W (HEPARIN  SODIUM/D5W) 50-5 UNIT/ML-% SOLN    Inject into the vein 2 (two) times daily. Via PICC   HYDROCODONE-ACETAMINOPHEN (NORCO/VICODIN) 5-325 MG TABLET    Take 1 tablet by mouth every 6 (six) hours as needed for moderate pain.   POLYETHYLENE GLYCOL (MIRALAX / GLYCOLAX) PACKET    Take 17 g by mouth daily.     No Known Allergies   REVIEW OF SYSTEMS:  GENERAL: no change in appetite, no fatigue, no weight changes, no fever, chills or weakness SKIN: Denies rash, itching, wounds, ulcer sores, or nail abnormality EYES: Denies change in vision, dry eyes, eye pain, itching or discharge EARS: Denies change in hearing, ringing in ears, or earache NOSE: Denies nasal congestion or epistaxis MOUTH and THROAT: Denies oral discomfort, gingival pain or bleeding, pain from teeth or hoarseness   RESPIRATORY: no cough, SOB, DOE, wheezing, hemoptysis CARDIAC: no chest pain, edema or palpitations GI: no abdominal pain, diarrhea, constipation, heart burn, nausea or vomiting GU: Denies dysuria, frequency, hematuria, incontinence, or discharge PSYCHIATRIC: Denies feeling of depression or anxiety. No report of hallucinations, insomnia, paranoia, or agitation   PHYSICAL EXAMINATION:  GENERAL APPEARANCE: Well nourished. In no acute distress. Normal body habitus SKIN:  Right hip scar from surgery HEAD: Normal in size and contour. No evidence of trauma EYES: Right eye prosthesis EARS: Pinnae are normal. Patient hears normal voice tunes of the examiner MOUTH and THROAT: Lips are without lesions. Oral mucosa is moist and without lesions. Tongue is normal in shape, size, and color and without lesions NECK: supple, trachea midline, no neck masses, no thyroid tenderness, no thyromegaly LYMPHATICS: no LAN in the neck, no supraclavicular LAN RESPIRATORY: breathing is even & unlabored, BS CTAB CARDIAC: RRR, no murmur,no extra heart sounds, BLE edema 1+; right upper arm SL PICC GI: abdomen soft, normal BS, no masses, no  tenderness, no hepatomegaly, no splenomegaly EXTREMITIES:  Able to move X 4 extremities PSYCHIATRIC: Alert and oriented X 3. Affect and behavior are appropriate  LABS/RADIOLOGY: Labs reviewed: Basic Metabolic Panel:  Recent Labs  04/02/16 0310 04/03/16 0403 04/04/16 04/04/16 0558 04/05/16 0500 04/09/16  NA 133* 133* 134* 134*  --  139  K 3.3* 3.6  --  4.1  --  3.9  CL 95* 98*  --  96*  --   --   CO2 28 28  --  30  --   --   GLUCOSE 103* 110*  --  103*  --   --   BUN <5* <5*  --  <5*  --  6  CREATININE 0.61 0.64 0.6 0.65 0.72 0.8  CALCIUM 8.4* 8.4*  --  8.3*  --   --   MG 1.7  --   --  1.9 1.7  --    Liver Function Tests:  Recent Labs  10/09/15 1159 04/09/16  AST 34 31  ALT 11* 17  ALKPHOS 101 112  BILITOT 0.7  --   PROT 7.0  --   ALBUMIN 2.8*  --    CBC:  Recent Labs  10/09/15 1159  03/31/16 1807 04/03/16 0403 04/04/16 0558 04/05/16 04/05/16 0500 04/09/16  WBC 7.4  < > 8.2 4.2 4.7 4.2 4.2 4.1  NEUTROABS 5.1  --  5.3  --   --   --   --  2  HGB 10.1*  < > 12.1* 9.5* 10.1*  --  9.6* 9.8*  HCT 31.7*  < > 36.6* 28.8* 29.9*  --  29.7* 30*  MCV 99.1  < > 94.6 98.0 98.0  --  97.4  --   PLT 311  < > 281 186 205  --  230 226  < > = values in this interval not displayed.   Dg Pelvis 1-2 Views  03/31/2016  CLINICAL DATA:  Right hip pain, multiple recent falls, prior right hip replacement in November 2016 EXAM: PELVIS - 1-2 VIEW COMPARISON:  10/18/2015 FINDINGS: Right hip arthroplasty in satisfactory position. No evidence of hardware fracture or loosening. Associated heterotopic calcification. Mild degenerative changes of the left hip. No fracture or dislocation is seen. Visualized bony pelvis appears intact. Mild degenerative changes the lower lumbar spine IMPRESSION: Right hip arthroplasty in satisfactory position. No evidence of complication. No fracture or dislocation is seen. Electronically Signed   By: Julian Hy M.D.   On: 03/31/2016 14:29   Ct Hip Right Wo  Contrast  03/31/2016  CLINICAL DATA:  Status post fall 6 days ago. Severe right hip pain. The patient is unable to walk. EXAM: CT OF THE RIGHT HIP WITHOUT CONTRAST TECHNIQUE: Multidetector CT imaging of the right hip was performed according to the standard protocol. Multiplanar CT image reconstructions were also generated. COMPARISON:  Plain film of the pelvis earlier today. Scratch the single view of the pelvis earlier today and 10/18/2015. CT pelvis 07/04/2015. FINDINGS: Right total hip arthroplasty is in place. There is extensive osteolysis about the acetabular component which is new since the prior study. The medial wall of the acetabulum is markedly thinned and fragmented, new since the most recent examination. Extensive heterotopic calcification is present about the device. Lucency is seen about both anchoring screws in the acetabulum. There is also some lucency about the proximal aspect of the femoral component. The device is located and no fracture is identified. Extensive stranding is present in subcutaneous fatty tissues about the right hip. The right iliacus muscle is markedly swollen and there appears to be a fluid collection within it measuring approximately 3.7 cm transverse by 4.8 cm AP. The craniocaudal dimension of the collection cannot be assessed on this study as it is not entirely included. A collection in the shallow subcutaneous soft tissues measures approximately 2.2 cm AP x 1.7 cm transverse by 3.8 cm craniocaudal. Imaged musculature of the pelvis demonstrates some atrophy. Extensive atherosclerotic vascular disease is noted. IMPRESSION: Right hip arthroplasty in place with extensive ostial license, heterotopic calcification and loosening of the acetabular component. Fluid collection in the right iliacus muscle is incompletely visualized. Findings are most worrisome for infection and abscess formation in the iliacus. Particle disease is possible but thought less likely. Extensive stranding  in subcutaneous fatty tissues over the  right hip with a likely subcutaneous fluid collection worrisome for abscess as described above. Negative for fracture. Electronically Signed   By: Inge Rise M.D.   On: 03/31/2016 17:07   Ct Aspiration  04/02/2016  INDICATION: 70 year old male with a history of concern for right hip infection. He has been referred for fluid aspiration. EXAM: CT GUIDANCE NEEDLE PLACEMENT MEDICATIONS: The patient is currently admitted to the hospital and receiving intravenous antibiotics. The antibiotics were administered within an appropriate time frame prior to the initiation of the procedure. ANESTHESIA/SEDATION: Fentanyl 50 mcg IV; Versed 0 mg IV No moderate sedation with only 1 medication administered. COMPLICATIONS: None PROCEDURE: Informed written consent was obtained from the patient after a thorough discussion of the procedural risks, benefits and alternatives. All questions were addressed. Maximal Sterile Barrier Technique was utilized including caps, mask, sterile gowns, sterile gloves, sterile drape, hand hygiene and skin antiseptic. A timeout was performed prior to the initiation of the procedure. Patient was positioned supine position on CT gantry table and a scout CT of the pelvis was acquired for planning purposes. Once the patient is prepped and draped in the usual sterile fashion adjacent to the right iliac crest, the skin and subcutaneous tissues were generously infiltrated with 1% lidocaine for local anesthesia. A small stab incision was made with 11 blade scalpel. A Yueh needle was advanced into the fluid collection of the right iliacus musculature. Approximately 5 cc of complex fluid was aspirated. Amplatz wire was then placed through the Yueh needle which was exchanged for a 10 French drainage catheter. Drainage catheter was used to aspirate another approximately 35 cc of complex fluid. Sample was sent the lab for analysis. All drains were removed and a sterile  bandage was placed. Patient tolerated the procedure well and remained hemodynamically stable throughout. No complications were encountered and no significant blood loss encountered. IMPRESSION: Status post CT-guided aspiration of right pelvic fluid collection contiguous with the right hip process, with approximately 40 cc of complex serosanguineous fluid aspirated. Sample sent the lab for analysis. Signed, Dulcy Fanny. Earleen Newport, DO [facility] Electronically Signed   By: Corrie Mckusick D.O.   On: 04/02/2016 11:48   Dg Femur, Min 2 Views Right  03/31/2016  CLINICAL DATA:  Right hip pain, multiple falls, status post right hip arthroplasty in 10/2015 EXAM: RIGHT FEMUR 2 VIEWS COMPARISON:  None. FINDINGS: Right hip arthroplasty in satisfactory position. No evidence of complication. Heterotopic calcification on the right hip prosthesis. No fracture or dislocation is seen. Mild tricompartmental degenerative changes of the knee. No suprapatellar knee joint effusion. Vascular calcifications. IMPRESSION: Right hip arthroplasty, without evidence of complication. No fracture or dislocation is seen. Electronically Signed   By: Julian Hy M.D.   On: 03/31/2016 14:31    ASSESSMENT/PLAN:  Protein calorie malnutrition, severe - albumin 2.44; start Procel 2 scoops by mouth twice a day; RD consult     [facility], NP [employer] [phone]

## 2016-04-10 NOTE — Progress Notes (Signed)
Patient ID: Jesus Daniel, male   DOB: [birth year], 70 y.o.   MRN: [id number]    DATE:  10/31/15  MRN:  [id number]  BIRTHDAY: 30-Sep-1946  Facility:  Nursing Home Location:  [place] Room Number: 508-P  LEVEL OF CARE:  SNF (31)  Contact Information    Name Relation Home Work [name] Son   [phone]   [name], [place]   [phone]       Chief Complaint  Patient presents with  . Discharge Note    HISTORY OF PRESENT ILLNESS:  This is a 70 year old male who is for discharge home with Home health PT, OT and Nursing. DME:  Rolling walker.   He has been admitted to [facility] on 10/20/15 from [facility]. He has PMH of GERD, depression, anxiety, skin cancer, urinary hesitancy, arthritis and gout. He has osteoarthritis of right hip for which she had right total hip arthroplasty on 10/18/15.  Patient was admitted to this facility for short-term rehabilitation after the patient's recent hospitalization.  Patient has completed SNF rehabilitation and therapy has cleared the patient for discharge.   PAST MEDICAL HISTORY:  Past Medical History  Diagnosis Date  . GERD (gastroesophageal reflux disease)   . Depression   . Anxiety   . Cancer ([place])     skin  . Urinary hesitancy   . Arthritis     "probably in my knees" (10/18/2015)  . History of gout   . MRSA (methicillin resistant staph aureus) culture positive     Right hip abscess  . Alcohol use disorder ([place])   . BPH (benign prostatic hyperplasia)      CURRENT MEDICATIONS: Reviewed  Patient's Medications                Previous Medications       FUROSEMIDE (LASIX) 40 MG TABLET    Take 40 mg by mouth 2 (two) times daily as needed for fluid.                POTASSIUM CHLORIDE (K-DUR) 20 MEQ TABLET    Take 20 mEq by mouth daily.   PROTEIN (PROCEL) POWD    Take 2 scoop by mouth 2 (two) times daily.       TAMSULOSIN (FLOMAX) 0.4 MG CAPS CAPSULE    Take 0.4  mg by mouth daily.      Modified Medications   No medications on file     BISACODYL (DULCOLAX) 5 MG EC TABLET    Take 1 tablet (5 mg total) by mouth daily as needed for moderate constipation.   CHOLECALCIFEROL (VITAMIN D) 1000 UNITS TABLET    Take 1,000 Units by mouth daily.       FOLIC ACID (FOLVITE) 1 MG TABLET    Take 1 mg by mouth daily.   LORAZEPAM (ATIVAN) 1 MG TABLET    Take 1 mg by mouth daily as needed for anxiety.    ONDANSETRON (ZOFRAN) 4 MG TABLET    Take 1 tablet (4 mg total) by mouth every 8 (eight) hours as needed for nausea or vomiting.   OXYCODONE (ROXICODONE) 5 MG IMMEDIATE RELEASE TABLET    Take 1-2 tabs po q4-6 hours prn pain   PANTOPRAZOLE (PROTONIX) 40 MG TABLET    Take 40 mg by mouth daily.       THIAMINE (VITAMIN B-1) 100 MG TABLET    Take 100 mg by mouth daily.   VITAMIN B-12 (CYANOCOBALAMIN)  1000 MCG TABLET    Take 1,000 mcg by mouth daily.    No Known Allergies   REVIEW OF SYSTEMS:  GENERAL: no change in appetite, no fatigue, no weight changes, no fever, chills or weakness EYES: Denies change in vision, dry eyes, eye pain, itching or discharge EARS: Denies change in hearing, ringing in ears, or earache NOSE: Denies nasal congestion or epistaxis MOUTH and THROAT: Denies oral discomfort, gingival pain or bleeding, pain from teeth or hoarseness   RESPIRATORY: no cough, SOB, DOE, wheezing, hemoptysis CARDIAC: no chest pain, edema or palpitations GI: no abdominal pain, diarrhea, constipation, heart burn, nausea or vomiting GU: Denies dysuria, frequency, hematuria, incontinence, or discharge PSYCHIATRIC: Denies feeling of depression or anxiety. No report of hallucinations, insomnia, paranoia, or agitation   PHYSICAL EXAMINATION  GENERAL APPEARANCE: Well nourished. In no acute distress. Normal body habitus SKIN:  Right hip surgical incision has steri-strip and dry dressing HEAD: Normal in size and contour. No evidence of trauma EYES: Lids open and close  normally. No blepharitis, entropion or ectropion. PERRL. Conjunctivae are clear and sclerae are white. Lenses are without opacity EARS: Pinnae are normal. Patient hears normal voice tunes of the examiner MOUTH and THROAT: Lips are without lesions. Oral mucosa is moist and without lesions. Tongue is normal in shape, size, and color and without lesions NECK: supple, trachea midline, no neck masses, no thyroid tenderness, no thyromegaly LYMPHATICS: no LAN in the neck, no supraclavicular LAN RESPIRATORY: breathing is even & unlabored, BS CTAB CARDIAC: RRR, no murmur,no extra heart sounds, RLE edema 2+ GI: abdomen soft, normal BS, no masses, no tenderness, no hepatomegaly, no splenomegaly PSYCHIATRIC: Alert and oriented X 3. Affect and behavior are appropriate  LABS/RADIOLOGY: Labs reviewed: 10/24/15   WBC 6.2 hemoglobin 8.8 hematocrit 27.5 MCV 94.8 platelet 253 sodium 130 5K3.4 glucose 90 BUN 13 creatinine 0.89 calcium 8.6 Liver Function Tests:  Recent Labs  10/09/15 1159   AST 34   ALT 11*   ALKPHOS 101   BILITOT 0.7   PROT 7.0   ALBUMIN 2.8*     Recent Labs   10/09/15 1159         WBC 7.4         NEUTROABS 5.1         HGB 10.1*         HCT 31.7*         MCV 99.1         PLT 311         < > = values in this interval not displayed.CBC:     ASSESSMENT/PLAN:  Unsteady gait - for Home health PT, OT and Nursing  Osteoarthritis S/P right total hip arthroplasty - for Home health PT, OT and nursing; continue  oxycodone 5 mg IR 1-2 tabs by mouth every 4 hours when necessary for pain; follow-up with Dr. Percell Miller, orthopedic surgeon  Anxiety  - mood this is stable; continue Ativan 1 mg by mouth daily when necessary   GERD - continue Protonix 40 mg 1 tab by mouth daily  Hypertension - well controlled; continue Lasix 40 mg by mouth twice a day  Edema of lower extremity - continue Lasix and KCl supplementation  BPH - continue Flomax 0.4 mg 1 capsule by mouth daily  Anemia, acute  blood loss -  S/P transfusion of 2 units packed RBC; hemoglobin 8.8; stable   Protein calorie malnutrition, severe - albumin 2.8; continue Procel 2 scoops by mouth twice a day  I have filled out patient's discharge paperwork and written prescriptions.  Patient will receive home health PT, OT and Nursing.  DME provided:  Rolling walker  Total discharge time: Greater than 30 minutes  Discharge time involved coordination of the discharge process with Education officer, museum, nursing staff and therapy department. Medical justification for home health services/DME verified.     [facility], NP [employer] [phone]

## 2016-04-11 DIAGNOSIS — M25551 Pain in right hip: Secondary | ICD-10-CM | POA: Diagnosis not present

## 2016-04-11 DIAGNOSIS — L02415 Cutaneous abscess of right lower limb: Secondary | ICD-10-CM | POA: Diagnosis not present

## 2016-04-11 DIAGNOSIS — M6281 Muscle weakness (generalized): Secondary | ICD-10-CM | POA: Diagnosis not present

## 2016-04-11 DIAGNOSIS — Z981 Arthrodesis status: Secondary | ICD-10-CM | POA: Diagnosis not present

## 2016-04-11 DIAGNOSIS — D649 Anemia, unspecified: Secondary | ICD-10-CM | POA: Diagnosis not present

## 2016-04-11 DIAGNOSIS — Z5189 Encounter for other specified aftercare: Secondary | ICD-10-CM | POA: Diagnosis not present

## 2016-04-11 DIAGNOSIS — G8929 Other chronic pain: Secondary | ICD-10-CM | POA: Diagnosis not present

## 2016-04-11 DIAGNOSIS — R262 Difficulty in walking, not elsewhere classified: Secondary | ICD-10-CM | POA: Diagnosis not present

## 2016-04-11 DIAGNOSIS — R2681 Unsteadiness on feet: Secondary | ICD-10-CM | POA: Diagnosis not present

## 2016-04-12 ENCOUNTER — Other Ambulatory Visit: Payer: Self-pay

## 2016-04-12 DIAGNOSIS — Z79899 Other long term (current) drug therapy: Secondary | ICD-10-CM | POA: Diagnosis not present

## 2016-04-12 DIAGNOSIS — Z981 Arthrodesis status: Secondary | ICD-10-CM | POA: Diagnosis not present

## 2016-04-12 DIAGNOSIS — Z5189 Encounter for other specified aftercare: Secondary | ICD-10-CM | POA: Diagnosis not present

## 2016-04-12 DIAGNOSIS — L02415 Cutaneous abscess of right lower limb: Secondary | ICD-10-CM | POA: Diagnosis not present

## 2016-04-12 DIAGNOSIS — M6281 Muscle weakness (generalized): Secondary | ICD-10-CM | POA: Diagnosis not present

## 2016-04-12 DIAGNOSIS — R262 Difficulty in walking, not elsewhere classified: Secondary | ICD-10-CM | POA: Diagnosis not present

## 2016-04-12 DIAGNOSIS — D649 Anemia, unspecified: Secondary | ICD-10-CM | POA: Diagnosis not present

## 2016-04-12 DIAGNOSIS — G8929 Other chronic pain: Secondary | ICD-10-CM | POA: Diagnosis not present

## 2016-04-12 DIAGNOSIS — R2681 Unsteadiness on feet: Secondary | ICD-10-CM | POA: Diagnosis not present

## 2016-04-12 DIAGNOSIS — M25551 Pain in right hip: Secondary | ICD-10-CM | POA: Diagnosis not present

## 2016-04-12 MED ORDER — OXYCODONE HCL 5 MG PO TABS: ORAL_TABLET | ORAL | Status: DC

## 2016-04-12 NOTE — Telephone Encounter (Signed)
[facility] fax request

## 2016-04-15 DIAGNOSIS — R262 Difficulty in walking, not elsewhere classified: Secondary | ICD-10-CM | POA: Diagnosis not present

## 2016-04-15 DIAGNOSIS — L02415 Cutaneous abscess of right lower limb: Secondary | ICD-10-CM | POA: Diagnosis not present

## 2016-04-15 DIAGNOSIS — D649 Anemia, unspecified: Secondary | ICD-10-CM | POA: Diagnosis not present

## 2016-04-15 DIAGNOSIS — M25551 Pain in right hip: Secondary | ICD-10-CM | POA: Diagnosis not present

## 2016-04-15 DIAGNOSIS — Z5189 Encounter for other specified aftercare: Secondary | ICD-10-CM | POA: Diagnosis not present

## 2016-04-15 DIAGNOSIS — Z981 Arthrodesis status: Secondary | ICD-10-CM | POA: Diagnosis not present

## 2016-04-15 DIAGNOSIS — R2681 Unsteadiness on feet: Secondary | ICD-10-CM | POA: Diagnosis not present

## 2016-04-15 DIAGNOSIS — G8929 Other chronic pain: Secondary | ICD-10-CM | POA: Diagnosis not present

## 2016-04-15 DIAGNOSIS — M6281 Muscle weakness (generalized): Secondary | ICD-10-CM | POA: Diagnosis not present

## 2016-04-16 DIAGNOSIS — D649 Anemia, unspecified: Secondary | ICD-10-CM | POA: Diagnosis not present

## 2016-04-16 DIAGNOSIS — Z981 Arthrodesis status: Secondary | ICD-10-CM | POA: Diagnosis not present

## 2016-04-16 DIAGNOSIS — M6281 Muscle weakness (generalized): Secondary | ICD-10-CM | POA: Diagnosis not present

## 2016-04-16 DIAGNOSIS — R2681 Unsteadiness on feet: Secondary | ICD-10-CM | POA: Diagnosis not present

## 2016-04-16 DIAGNOSIS — R262 Difficulty in walking, not elsewhere classified: Secondary | ICD-10-CM | POA: Diagnosis not present

## 2016-04-16 DIAGNOSIS — Z5189 Encounter for other specified aftercare: Secondary | ICD-10-CM | POA: Diagnosis not present

## 2016-04-16 DIAGNOSIS — G8929 Other chronic pain: Secondary | ICD-10-CM | POA: Diagnosis not present

## 2016-04-16 DIAGNOSIS — M25551 Pain in right hip: Secondary | ICD-10-CM | POA: Diagnosis not present

## 2016-04-16 DIAGNOSIS — L02415 Cutaneous abscess of right lower limb: Secondary | ICD-10-CM | POA: Diagnosis not present

## 2016-04-17 DIAGNOSIS — M25551 Pain in right hip: Secondary | ICD-10-CM | POA: Diagnosis not present

## 2016-04-17 DIAGNOSIS — R262 Difficulty in walking, not elsewhere classified: Secondary | ICD-10-CM | POA: Diagnosis not present

## 2016-04-17 DIAGNOSIS — Z79899 Other long term (current) drug therapy: Secondary | ICD-10-CM | POA: Diagnosis not present

## 2016-04-17 DIAGNOSIS — Z981 Arthrodesis status: Secondary | ICD-10-CM | POA: Diagnosis not present

## 2016-04-17 DIAGNOSIS — G8929 Other chronic pain: Secondary | ICD-10-CM | POA: Diagnosis not present

## 2016-04-17 DIAGNOSIS — Z5189 Encounter for other specified aftercare: Secondary | ICD-10-CM | POA: Diagnosis not present

## 2016-04-17 DIAGNOSIS — M6281 Muscle weakness (generalized): Secondary | ICD-10-CM | POA: Diagnosis not present

## 2016-04-17 DIAGNOSIS — D649 Anemia, unspecified: Secondary | ICD-10-CM | POA: Diagnosis not present

## 2016-04-17 DIAGNOSIS — L02415 Cutaneous abscess of right lower limb: Secondary | ICD-10-CM | POA: Diagnosis not present

## 2016-04-17 DIAGNOSIS — R2681 Unsteadiness on feet: Secondary | ICD-10-CM | POA: Diagnosis not present

## 2016-04-18 DIAGNOSIS — M6281 Muscle weakness (generalized): Secondary | ICD-10-CM | POA: Diagnosis not present

## 2016-04-18 DIAGNOSIS — D649 Anemia, unspecified: Secondary | ICD-10-CM | POA: Diagnosis not present

## 2016-04-18 DIAGNOSIS — G8929 Other chronic pain: Secondary | ICD-10-CM | POA: Diagnosis not present

## 2016-04-18 DIAGNOSIS — L02415 Cutaneous abscess of right lower limb: Secondary | ICD-10-CM | POA: Diagnosis not present

## 2016-04-18 DIAGNOSIS — M25551 Pain in right hip: Secondary | ICD-10-CM | POA: Diagnosis not present

## 2016-04-18 DIAGNOSIS — R2681 Unsteadiness on feet: Secondary | ICD-10-CM | POA: Diagnosis not present

## 2016-04-18 DIAGNOSIS — Z981 Arthrodesis status: Secondary | ICD-10-CM | POA: Diagnosis not present

## 2016-04-18 DIAGNOSIS — Z5189 Encounter for other specified aftercare: Secondary | ICD-10-CM | POA: Diagnosis not present

## 2016-04-18 DIAGNOSIS — R262 Difficulty in walking, not elsewhere classified: Secondary | ICD-10-CM | POA: Diagnosis not present

## 2016-04-19 DIAGNOSIS — M6281 Muscle weakness (generalized): Secondary | ICD-10-CM | POA: Diagnosis not present

## 2016-04-19 DIAGNOSIS — Z981 Arthrodesis status: Secondary | ICD-10-CM | POA: Diagnosis not present

## 2016-04-19 DIAGNOSIS — L02415 Cutaneous abscess of right lower limb: Secondary | ICD-10-CM | POA: Diagnosis not present

## 2016-04-19 DIAGNOSIS — G8929 Other chronic pain: Secondary | ICD-10-CM | POA: Diagnosis not present

## 2016-04-19 DIAGNOSIS — Z5189 Encounter for other specified aftercare: Secondary | ICD-10-CM | POA: Diagnosis not present

## 2016-04-19 DIAGNOSIS — R262 Difficulty in walking, not elsewhere classified: Secondary | ICD-10-CM | POA: Diagnosis not present

## 2016-04-19 DIAGNOSIS — Z79899 Other long term (current) drug therapy: Secondary | ICD-10-CM | POA: Diagnosis not present

## 2016-04-19 DIAGNOSIS — D649 Anemia, unspecified: Secondary | ICD-10-CM | POA: Diagnosis not present

## 2016-04-19 DIAGNOSIS — R2681 Unsteadiness on feet: Secondary | ICD-10-CM | POA: Diagnosis not present

## 2016-04-19 DIAGNOSIS — M25551 Pain in right hip: Secondary | ICD-10-CM | POA: Diagnosis not present

## 2016-04-19 LAB — BASIC METABOLIC PANEL
BUN: 6 mg/dL (ref 4–21)
Creatinine: 0.8 mg/dL (ref 0.6–1.3)

## 2016-04-22 DIAGNOSIS — Z981 Arthrodesis status: Secondary | ICD-10-CM | POA: Diagnosis not present

## 2016-04-22 DIAGNOSIS — Z5189 Encounter for other specified aftercare: Secondary | ICD-10-CM | POA: Diagnosis not present

## 2016-04-22 DIAGNOSIS — Z79899 Other long term (current) drug therapy: Secondary | ICD-10-CM | POA: Diagnosis not present

## 2016-04-22 DIAGNOSIS — M6281 Muscle weakness (generalized): Secondary | ICD-10-CM | POA: Diagnosis not present

## 2016-04-22 DIAGNOSIS — M25551 Pain in right hip: Secondary | ICD-10-CM | POA: Diagnosis not present

## 2016-04-22 DIAGNOSIS — G8929 Other chronic pain: Secondary | ICD-10-CM | POA: Diagnosis not present

## 2016-04-22 DIAGNOSIS — L02415 Cutaneous abscess of right lower limb: Secondary | ICD-10-CM | POA: Diagnosis not present

## 2016-04-22 DIAGNOSIS — R262 Difficulty in walking, not elsewhere classified: Secondary | ICD-10-CM | POA: Diagnosis not present

## 2016-04-22 DIAGNOSIS — D649 Anemia, unspecified: Secondary | ICD-10-CM | POA: Diagnosis not present

## 2016-04-22 DIAGNOSIS — R2681 Unsteadiness on feet: Secondary | ICD-10-CM | POA: Diagnosis not present

## 2016-04-22 LAB — BASIC METABOLIC PANEL
BUN: 5 mg/dL (ref 4–21)
CREATININE: 0.7 mg/dL (ref 0.6–1.3)

## 2016-04-23 DIAGNOSIS — Z96641 Presence of right artificial hip joint: Secondary | ICD-10-CM | POA: Diagnosis not present

## 2016-04-24 DIAGNOSIS — R262 Difficulty in walking, not elsewhere classified: Secondary | ICD-10-CM | POA: Diagnosis not present

## 2016-04-24 DIAGNOSIS — L02415 Cutaneous abscess of right lower limb: Secondary | ICD-10-CM | POA: Diagnosis not present

## 2016-04-24 DIAGNOSIS — Z5189 Encounter for other specified aftercare: Secondary | ICD-10-CM | POA: Diagnosis not present

## 2016-04-24 DIAGNOSIS — Z79899 Other long term (current) drug therapy: Secondary | ICD-10-CM | POA: Diagnosis not present

## 2016-04-24 DIAGNOSIS — R2681 Unsteadiness on feet: Secondary | ICD-10-CM | POA: Diagnosis not present

## 2016-04-24 DIAGNOSIS — M6281 Muscle weakness (generalized): Secondary | ICD-10-CM | POA: Diagnosis not present

## 2016-04-24 DIAGNOSIS — M25551 Pain in right hip: Secondary | ICD-10-CM | POA: Diagnosis not present

## 2016-04-24 DIAGNOSIS — Z981 Arthrodesis status: Secondary | ICD-10-CM | POA: Diagnosis not present

## 2016-04-24 DIAGNOSIS — G8929 Other chronic pain: Secondary | ICD-10-CM | POA: Diagnosis not present

## 2016-04-24 DIAGNOSIS — D649 Anemia, unspecified: Secondary | ICD-10-CM | POA: Diagnosis not present

## 2016-04-25 DIAGNOSIS — Z79899 Other long term (current) drug therapy: Secondary | ICD-10-CM | POA: Diagnosis not present

## 2016-04-25 DIAGNOSIS — R627 Adult failure to thrive: Secondary | ICD-10-CM | POA: Diagnosis not present

## 2016-04-25 LAB — BASIC METABOLIC PANEL
BUN: 6 mg/dL (ref 4–21)
Creatinine: 0.8 mg/dL (ref 0.6–1.3)

## 2016-04-29 DIAGNOSIS — Z5189 Encounter for other specified aftercare: Secondary | ICD-10-CM | POA: Diagnosis not present

## 2016-04-29 DIAGNOSIS — G8929 Other chronic pain: Secondary | ICD-10-CM | POA: Diagnosis not present

## 2016-04-29 DIAGNOSIS — R262 Difficulty in walking, not elsewhere classified: Secondary | ICD-10-CM | POA: Diagnosis not present

## 2016-04-29 DIAGNOSIS — L02415 Cutaneous abscess of right lower limb: Secondary | ICD-10-CM | POA: Diagnosis not present

## 2016-04-29 DIAGNOSIS — M6281 Muscle weakness (generalized): Secondary | ICD-10-CM | POA: Diagnosis not present

## 2016-04-29 DIAGNOSIS — R2681 Unsteadiness on feet: Secondary | ICD-10-CM | POA: Diagnosis not present

## 2016-04-29 DIAGNOSIS — M25551 Pain in right hip: Secondary | ICD-10-CM | POA: Diagnosis not present

## 2016-04-29 DIAGNOSIS — D649 Anemia, unspecified: Secondary | ICD-10-CM | POA: Diagnosis not present

## 2016-04-29 DIAGNOSIS — Z981 Arthrodesis status: Secondary | ICD-10-CM | POA: Diagnosis not present

## 2016-04-30 DIAGNOSIS — G8929 Other chronic pain: Secondary | ICD-10-CM | POA: Diagnosis not present

## 2016-04-30 DIAGNOSIS — M25551 Pain in right hip: Secondary | ICD-10-CM | POA: Diagnosis not present

## 2016-04-30 DIAGNOSIS — R262 Difficulty in walking, not elsewhere classified: Secondary | ICD-10-CM | POA: Diagnosis not present

## 2016-04-30 DIAGNOSIS — Z5189 Encounter for other specified aftercare: Secondary | ICD-10-CM | POA: Diagnosis not present

## 2016-04-30 DIAGNOSIS — L02415 Cutaneous abscess of right lower limb: Secondary | ICD-10-CM | POA: Diagnosis not present

## 2016-04-30 DIAGNOSIS — D649 Anemia, unspecified: Secondary | ICD-10-CM | POA: Diagnosis not present

## 2016-04-30 DIAGNOSIS — Z981 Arthrodesis status: Secondary | ICD-10-CM | POA: Diagnosis not present

## 2016-04-30 DIAGNOSIS — M6281 Muscle weakness (generalized): Secondary | ICD-10-CM | POA: Diagnosis not present

## 2016-04-30 DIAGNOSIS — R2681 Unsteadiness on feet: Secondary | ICD-10-CM | POA: Diagnosis not present

## 2016-05-01 DIAGNOSIS — Z79899 Other long term (current) drug therapy: Secondary | ICD-10-CM | POA: Diagnosis not present

## 2016-05-01 LAB — BASIC METABOLIC PANEL
BUN: 8 mg/dL (ref 4–21)
Creatinine: 0.8 mg/dL (ref 0.6–1.3)

## 2016-05-02 DIAGNOSIS — G8929 Other chronic pain: Secondary | ICD-10-CM | POA: Diagnosis not present

## 2016-05-02 DIAGNOSIS — Z981 Arthrodesis status: Secondary | ICD-10-CM | POA: Diagnosis not present

## 2016-05-02 DIAGNOSIS — M25551 Pain in right hip: Secondary | ICD-10-CM | POA: Diagnosis not present

## 2016-05-02 DIAGNOSIS — R2681 Unsteadiness on feet: Secondary | ICD-10-CM | POA: Diagnosis not present

## 2016-05-02 DIAGNOSIS — Z5189 Encounter for other specified aftercare: Secondary | ICD-10-CM | POA: Diagnosis not present

## 2016-05-02 DIAGNOSIS — L02415 Cutaneous abscess of right lower limb: Secondary | ICD-10-CM | POA: Diagnosis not present

## 2016-05-02 DIAGNOSIS — D649 Anemia, unspecified: Secondary | ICD-10-CM | POA: Diagnosis not present

## 2016-05-02 DIAGNOSIS — M6281 Muscle weakness (generalized): Secondary | ICD-10-CM | POA: Diagnosis not present

## 2016-05-02 DIAGNOSIS — R262 Difficulty in walking, not elsewhere classified: Secondary | ICD-10-CM | POA: Diagnosis not present

## 2016-05-07 DIAGNOSIS — D649 Anemia, unspecified: Secondary | ICD-10-CM | POA: Diagnosis not present

## 2016-05-07 DIAGNOSIS — R2681 Unsteadiness on feet: Secondary | ICD-10-CM | POA: Diagnosis not present

## 2016-05-07 DIAGNOSIS — Z5189 Encounter for other specified aftercare: Secondary | ICD-10-CM | POA: Diagnosis not present

## 2016-05-07 DIAGNOSIS — G8929 Other chronic pain: Secondary | ICD-10-CM | POA: Diagnosis not present

## 2016-05-07 DIAGNOSIS — M6281 Muscle weakness (generalized): Secondary | ICD-10-CM | POA: Diagnosis not present

## 2016-05-07 DIAGNOSIS — M25551 Pain in right hip: Secondary | ICD-10-CM | POA: Diagnosis not present

## 2016-05-07 DIAGNOSIS — Z981 Arthrodesis status: Secondary | ICD-10-CM | POA: Diagnosis not present

## 2016-05-07 DIAGNOSIS — L02415 Cutaneous abscess of right lower limb: Secondary | ICD-10-CM | POA: Diagnosis not present

## 2016-05-07 DIAGNOSIS — R262 Difficulty in walking, not elsewhere classified: Secondary | ICD-10-CM | POA: Diagnosis not present

## 2016-05-08 ENCOUNTER — Ambulatory Visit (INDEPENDENT_AMBULATORY_CARE_PROVIDER_SITE_OTHER): Payer: PPO | Admitting: Infectious Diseases

## 2016-05-08 ENCOUNTER — Encounter: Payer: Self-pay | Admitting: Infectious Diseases

## 2016-05-08 VITALS — BP 133/76 | HR 103 | Temp 97.7°F

## 2016-05-08 DIAGNOSIS — L02415 Cutaneous abscess of right lower limb: Secondary | ICD-10-CM

## 2016-05-08 DIAGNOSIS — A4901 Methicillin susceptible Staphylococcus aureus infection, unspecified site: Secondary | ICD-10-CM | POA: Diagnosis not present

## 2016-05-08 DIAGNOSIS — Z79899 Other long term (current) drug therapy: Secondary | ICD-10-CM | POA: Diagnosis not present

## 2016-05-08 MED ORDER — DOXYCYCLINE HYCLATE 100 MG PO TABS: 100.0000 mg | ORAL_TABLET | Freq: Two times a day (BID) | ORAL | Status: DC

## 2016-05-08 NOTE — Progress Notes (Signed)
   Subjective:    Patient ID: Jesus Daniel, male    DOB: 06/30/1946, 69 y.o.   MRN: [id number]  HPI 69 y.o. male with hx of R THA in November of 2016 by Dr. Murphy. Afterwards his wound broke down during PT, this later healed.  He then had epsiode of "getting his right leg caught under the commode" in early April.  He then came to ED on 4-23 and had CT of the hip was performed and this disclosed a hip abscess in the iliacus muscle. 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 He was treated with vanco and rifampin, d/c to SNF On 4-28. He did not have surgery.  His planned end date is 05-17-16.  No problems with PIC- no erythema, no d/c.    4-25 ESR 90 CRP 8.4  The past medical history, family history and social history were reviewed/updated in EPIC No beer since adm to hospital.   Review of Systems  Constitutional: Negative for fever, chills, appetite change and unexpected weight change.  Gastrointestinal: Negative for diarrhea and constipation.  Genitourinary: Negative for difficulty urinating.  Please see HPI. 12 point ROS o/w (-) Takes prn lasix     Objective:   Physical Exam  Constitutional: He appears well-developed and well-nourished.  HENT:  Mouth/Throat: No oropharyngeal exudate.  Neck: Neck supple.  Cardiovascular: Normal rate, regular rhythm and normal heart sounds.   Pulmonary/Chest: Effort normal and breath sounds normal.  Abdominal: Soft. Bowel sounds are normal. There is no rebound.  Musculoskeletal: He exhibits edema.       Arms:      Legs: Lymphadenopathy:    He has no cervical adenopathy.  R eye prosthetic     Assessment & Plan:   

## 2016-05-08 NOTE — Assessment & Plan Note (Signed)
Needs repeat BCx at end of therapy. He has a notable murmur.  Will check TTE. He denies hx of heart murmur.

## 2016-05-08 NOTE — Assessment & Plan Note (Addendum)
He appears to be doing well. His wound is well healed. His PIC is clean.  We have no labs on him.  Will continue his vanco til the projected end date of 6-9 Will then change him to doxycycline.  Rifampin stopped due to [facility] Vanco Tr 17.9 Cr 0.77, (05-01-16)  Needs repeat BCx after he completes vanco.

## 2016-05-09 ENCOUNTER — Inpatient Hospital Stay: Payer: PPO | Admitting: Infectious Diseases

## 2016-05-09 DIAGNOSIS — M6281 Muscle weakness (generalized): Secondary | ICD-10-CM | POA: Diagnosis not present

## 2016-05-09 DIAGNOSIS — L02415 Cutaneous abscess of right lower limb: Secondary | ICD-10-CM | POA: Diagnosis not present

## 2016-05-09 DIAGNOSIS — Z981 Arthrodesis status: Secondary | ICD-10-CM | POA: Diagnosis not present

## 2016-05-09 DIAGNOSIS — G8929 Other chronic pain: Secondary | ICD-10-CM | POA: Diagnosis not present

## 2016-05-09 DIAGNOSIS — R2681 Unsteadiness on feet: Secondary | ICD-10-CM | POA: Diagnosis not present

## 2016-05-09 DIAGNOSIS — M25551 Pain in right hip: Secondary | ICD-10-CM | POA: Diagnosis not present

## 2016-05-09 DIAGNOSIS — Z5189 Encounter for other specified aftercare: Secondary | ICD-10-CM | POA: Diagnosis not present

## 2016-05-09 DIAGNOSIS — R262 Difficulty in walking, not elsewhere classified: Secondary | ICD-10-CM | POA: Diagnosis not present

## 2016-05-09 DIAGNOSIS — D649 Anemia, unspecified: Secondary | ICD-10-CM | POA: Diagnosis not present

## 2016-05-13 ENCOUNTER — Encounter: Payer: Self-pay | Admitting: Adult Health

## 2016-05-13 ENCOUNTER — Non-Acute Institutional Stay (SKILLED_NURSING_FACILITY): Payer: PPO | Admitting: Adult Health

## 2016-05-13 DIAGNOSIS — N400 Enlarged prostate without lower urinary tract symptoms: Secondary | ICD-10-CM

## 2016-05-13 DIAGNOSIS — R5381 Other malaise: Secondary | ICD-10-CM | POA: Diagnosis not present

## 2016-05-13 DIAGNOSIS — E43 Unspecified severe protein-calorie malnutrition: Secondary | ICD-10-CM | POA: Diagnosis not present

## 2016-05-13 DIAGNOSIS — R2681 Unsteadiness on feet: Secondary | ICD-10-CM | POA: Diagnosis not present

## 2016-05-13 DIAGNOSIS — D638 Anemia in other chronic diseases classified elsewhere: Secondary | ICD-10-CM

## 2016-05-13 DIAGNOSIS — D649 Anemia, unspecified: Secondary | ICD-10-CM | POA: Diagnosis not present

## 2016-05-13 DIAGNOSIS — Z5189 Encounter for other specified aftercare: Secondary | ICD-10-CM | POA: Diagnosis not present

## 2016-05-13 DIAGNOSIS — Z789 Other specified health status: Secondary | ICD-10-CM | POA: Diagnosis not present

## 2016-05-13 DIAGNOSIS — Z96641 Presence of right artificial hip joint: Secondary | ICD-10-CM | POA: Diagnosis not present

## 2016-05-13 DIAGNOSIS — K5901 Slow transit constipation: Secondary | ICD-10-CM | POA: Diagnosis not present

## 2016-05-13 DIAGNOSIS — L02415 Cutaneous abscess of right lower limb: Secondary | ICD-10-CM | POA: Diagnosis not present

## 2016-05-13 DIAGNOSIS — M1990 Unspecified osteoarthritis, unspecified site: Secondary | ICD-10-CM

## 2016-05-13 DIAGNOSIS — E876 Hypokalemia: Secondary | ICD-10-CM

## 2016-05-13 DIAGNOSIS — Z981 Arthrodesis status: Secondary | ICD-10-CM | POA: Diagnosis not present

## 2016-05-13 DIAGNOSIS — R600 Localized edema: Secondary | ICD-10-CM | POA: Diagnosis not present

## 2016-05-13 DIAGNOSIS — G8929 Other chronic pain: Secondary | ICD-10-CM | POA: Diagnosis not present

## 2016-05-13 DIAGNOSIS — M25551 Pain in right hip: Secondary | ICD-10-CM | POA: Diagnosis not present

## 2016-05-13 DIAGNOSIS — A4902 Methicillin resistant Staphylococcus aureus infection, unspecified site: Secondary | ICD-10-CM | POA: Diagnosis not present

## 2016-05-13 DIAGNOSIS — M1611 Unilateral primary osteoarthritis, right hip: Secondary | ICD-10-CM

## 2016-05-13 DIAGNOSIS — Z7289 Other problems related to lifestyle: Secondary | ICD-10-CM

## 2016-05-13 DIAGNOSIS — R262 Difficulty in walking, not elsewhere classified: Secondary | ICD-10-CM | POA: Diagnosis not present

## 2016-05-13 DIAGNOSIS — M6281 Muscle weakness (generalized): Secondary | ICD-10-CM | POA: Diagnosis not present

## 2016-05-13 LAB — BASIC METABOLIC PANEL
BUN: 6 mg/dL (ref 4–21)
Creatinine: 0.8 mg/dL (ref 0.6–1.3)

## 2016-05-13 NOTE — Progress Notes (Signed)
Patient ID: Jesus Daniel, male   DOB: [birth year], 70 y.o.   MRN: [id number]    DATE:  05/13/2016   MRN:  [id number]  BIRTHDAY: [birth year]  Facility:  Nursing Home Location:  [place] Room Number: 907-P  LEVEL OF CARE:  SNF (31)  Contact Information    Name Relation Home Work Mobile   [place] "[place]" Son   [phone]   [place], Gaetz   [phone]       Code Status History    Date Active Date Inactive Code Status Order ID Comments User Context   03/31/2016  5:50 PM 04/05/2016  7:58 PM Full Code [id number]  Riccardo Dubin, MD Inpatient   10/18/2015 12:48 PM 10/20/2015  6:57 PM Full Code [id number]  Aundra Dubin, PA-C Inpatient       Chief Complaint  Patient presents with  . Medical Management of Chronic Issues    HISTORY OF PRESENT ILLNESS:  This is a 70 year old male who is being seen for a routine visit. He will stop his Vancomycin on 6/09 and then will start Doxycycline 100 mg BID from 05/18/16. He will need 2 sets of blood culture on 05/20/16 and Dr. Johnnye Sima, Infectious Disease,  to determine stop date of Doxycycline. ATBs is for right hip abcess MRSA. He was recently started Procel for protein-calorie malnutrition. He was, also, started on Melatonin  for insomnia and Robaxin for muscle spasm.  He has been admitted to [facility] on 04/05/16 from [facility]. 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 He has been having multiple falls and pain on his right hip. CT right hip demonstrated extraarticular fluid collection, infection and loosening of the acetabular component of the hip prosthesis. He had a CT guided aspiration of right hip fluid collection demonstrated staph aureus, which was MRSA. He was treated with IV Vancomycin which was continued after the sensitivities were confirmed. Orthopedic surgery was consulted who recommended non-operative management, and joint  aspiration was deferred to prevent seeding of the joint by any remaining abscess. Infectious Disease was consulted, who recommended at least 6 weeks of IV Vancomycin and antibiotics indefinitely pending operative management.   He has indicated that he drinks 6 beers a day but did not exhibit signs of alcohol withdrawal in the hospital.  He has been admitted for a short-term rehabilitation.    PAST MEDICAL HISTORY:  Past Medical History  Diagnosis Date  . GERD (gastroesophageal reflux disease)   . Depression   . Anxiety   . Cancer ([place])     skin- melanoma.   . Urinary hesitancy   . Arthritis     "probably in my knees" (10/18/2015)  . History of gout   . MRSA (methicillin resistant staph aureus) culture positive 03-2016    Right hip abscess  . Alcohol use disorder ([place])   . BPH (benign prostatic hyperplasia)      CURRENT MEDICATIONS: Reviewed  Patient's Medications  New Prescriptions   No medications on file  Previous Medications   ACETAMINOPHEN (TYLENOL) 500 MG TABLET    Take 500 mg by mouth every 6 (six) hours as needed.   DOCUSATE SODIUM (COLACE) 100 MG CAPSULE    Take 100 mg by mouth daily.   DOXYCYCLINE (VIBRA-TABS) 100 MG TABLET    Take 100 mg by mouth 2 (two) times daily.   ENOXAPARIN (LOVENOX) 40 MG/0.4ML INJECTION    Inject 0.4 mLs (  40 mg total) into the skin daily.   FUROSEMIDE (LASIX) 40 MG TABLET    Take 40 mg by mouth 2 (two) times daily as needed for fluid.    HEPARIN 5,000 UNITS IN SODIUM CHLORIDE 0.9 % 50 ML    Inject 10 mLs as directed 2 (two) times daily.   HYDROCERIN (EUCERIN) CREA    Apply 1 application topically 2 (two) times daily.   HYDROXYZINE (ATARAX/VISTARIL) 10 MG TABLET    Take 1 tablet (10 mg total) by mouth every 6 (six) hours as needed for itching.   MELATONIN 5 MG TABS    Take 1 tablet by mouth daily. At 9PM   METHOCARBAMOL (ROBAXIN) 500 MG TABLET    Take 500 mg by mouth every 8 (eight) hours as needed for muscle spasms.   OXYCODONE (OXY  IR/ROXICODONE) 5 MG IMMEDIATE RELEASE TABLET    Take one tablet by mouth every 4 hours as needed for moderate-severe pain *Hold for sedation*   POLYETHYLENE GLYCOL (MIRALAX / GLYCOLAX) PACKET    Take 17 g by mouth daily as needed.   POTASSIUM CHLORIDE (K-DUR) 10 MEQ TABLET    Take 10 mEq by mouth daily.   PROTEIN (PROCEL) POWD    Take 2 scoop by mouth 2 (two) times daily.   SODIUM CHLORIDE FLUSH (NORMAL SALINE FLUSH IV)    Inject 10 mLs into the vein. Before and after IV antibiotics   TAMSULOSIN (FLOMAX) 0.4 MG CAPS CAPSULE    Take 0.4 mg by mouth daily.   VANCOMYCIN HCL IN NACL 1-0.9 GM/200ML-% SOLN    Inject into the vein every 8 (eight) hours. Until 05/17/2016  Modified Medications   No medications on file  Discontinued Medications   DOXYCYCLINE (VIBRA-TABS) 100 MG TABLET    Take 1 tablet (100 mg total) by mouth 2 (two) times daily.   HYDROCODONE-ACETAMINOPHEN (NORCO/VICODIN) 5-325 MG TABLET    Take 1 tablet by mouth every 4 (four) hours as needed for moderate pain.     No Known Allergies   REVIEW OF SYSTEMS:  GENERAL: no change in appetite, no fatigue, no weight changes, no fever, chills or weakness SKIN: Denies rash, itching, wounds, ulcer sores, or nail abnormality EYES: Denies change in vision, dry eyes, eye pain, itching or discharge EARS: Denies change in hearing, ringing in ears, or earache NOSE: Denies nasal congestion or epistaxis MOUTH and THROAT: Denies oral discomfort, gingival pain or bleeding, pain from teeth or hoarseness   RESPIRATORY: no cough, SOB, DOE, wheezing, hemoptysis CARDIAC: no chest pain, edema or palpitations GI: no abdominal pain, diarrhea, constipation, heart burn, nausea or vomiting GU: Denies dysuria, frequency, hematuria, incontinence, or discharge PSYCHIATRIC: Denies feeling of depression or anxiety. No report of hallucinations, insomnia, paranoia, or agitation   PHYSICAL EXAMINATION:  GENERAL APPEARANCE: Well nourished. In no acute distress.  Normal body habitus SKIN:  Right hip scar from surgery HEAD: Normal in size and contour. No evidence of trauma EYES: Right eye prosthesis EARS: Pinnae are normal. Patient hears normal voice tunes of the examiner MOUTH and THROAT: Lips are without lesions. Oral mucosa is moist and without lesions. Tongue is normal in shape, size, and color and without lesions NECK: supple, trachea midline, no neck masses, no thyroid tenderness, no thyromegaly LYMPHATICS: no LAN in the neck, no supraclavicular LAN RESPIRATORY: breathing is even & unlabored, BS CTAB CARDIAC: RRR, no murmur,no extra heart sounds, BLE edema 1+; right upper arm SL PICC GI: abdomen soft, normal BS, no masses, no tenderness,  no hepatomegaly, no splenomegaly EXTREMITIES:  Able to move X 4 extremities PSYCHIATRIC: Alert and oriented X 3. Affect and behavior are appropriate  LABS/RADIOLOGY: Labs reviewed: Basic Metabolic Panel:  Recent Labs  04/02/16 0310 04/03/16 0403  04/04/16 04/04/16 0558 04/05/16 0500 04/09/16  04/22/16 04/25/16 05/01/16  NA 133* 133*  --  134* 134*  --  139  --   --   --   --   K 3.3* 3.6  --   --  4.1  --  3.9  --   --   --   --   CL 95* 98*  --   --  96*  --   --   --   --   --   --   CO2 28 28  --   --  30  --   --   --   --   --   --   GLUCOSE 103* 110*  --   --  103*  --   --   --   --   --   --   BUN <5* <5*  --   --  <5*  --  6  < > 5 6 8   CREATININE 0.61 0.64  < > 0.6 0.65 0.72 0.8  < > 0.7 0.8 0.8  CALCIUM 8.4* 8.4*  --   --  8.3*  --   --   --   --   --   --   MG 1.7  --   --   --  1.9 1.7  --   --   --   --   --   < > = values in this interval not displayed. Liver Function Tests:  Recent Labs  10/09/15 1159 04/09/16  AST 34 31  ALT 11* 17  ALKPHOS 101 112  BILITOT 0.7  --   PROT 7.0  --   ALBUMIN 2.8*  --    CBC:  Recent Labs  10/09/15 1159  03/31/16 1807 04/03/16 0403 04/04/16 0558 04/05/16 04/05/16 0500 04/09/16  WBC 7.4  < > 8.2 4.2 4.7 4.2 4.2 4.1  NEUTROABS 5.1  --   5.3  --   --   --   --  2  HGB 10.1*  < > 12.1* 9.5* 10.1*  --  9.6* 9.8*  HCT 31.7*  < > 36.6* 28.8* 29.9*  --  29.7* 30*  MCV 99.1  < > 94.6 98.0 98.0  --  97.4  --   PLT 311  < > 281 186 205  --  230 226  < > = values in this interval not displayed.    ASSESSMENT/PLAN:  Physical Deconditioning - continue rehabilitation, PT and OT   Right hip MRSA abscess - continue Vancomycin 1,000 mg IV Q 8 hours till May 17, 2016 then start  Doxycycline 100 mg PO BID from 05/18/16; follow-up with Infectious Disease, Dr. Johnnye Sima,  for antibiotic stop date; will need 2 sets of blood culture on 05/20/16  Osteoarthritis of right hip S/P right hip arthroplasty - continue rehabilitation; continue Oxycodone 5 mg 1 tab Q 4 hours  PRN and Tylenol extra strength 500 mg 1 tab by mouth every 6 hours for pain; call Dr. Debroah Loop office re; Lovenox  Length of therapy; Robaxin 500 mg 1 tab by mouth every 8 hours when necessary for muscle spasm  Lower extremity edema - continue Lasix 40 mg 1 tab PO BID PRN  Constipation - continue Miralax 17  gm daily and Colace 100 mg 1 capsule by mouth daily  Hypokalemia - continue K-Dur  10 meq PO daily Lab Results  Component Value Date   K 3.9 04/09/2016    BPH - continue Flomax 0.4 mg 1 capsule daily  Anemia of chronic disease -  stable Lab Results  Component Value Date   WBC 4.1 04/09/2016   HGB 9.8* 04/09/2016   HCT 30* 04/09/2016   MCV 97.4 04/05/2016   PLT 226 04/09/2016    Alcohol use disorder - monitor for withdrawal; counseling regarding alcohol cessation   Protein calorie malnutrition, severe - albumin 2.44; continue Procel 2 scoops by mouth twice a day     Goals of care:  Short-term rehabilitation    Durenda Age, NP [place] [phone]

## 2016-05-15 ENCOUNTER — Encounter: Payer: Self-pay | Admitting: Infectious Diseases

## 2016-05-15 ENCOUNTER — Encounter: Payer: Self-pay | Admitting: Infectious Disease

## 2016-05-15 DIAGNOSIS — L02415 Cutaneous abscess of right lower limb: Secondary | ICD-10-CM | POA: Diagnosis not present

## 2016-05-15 DIAGNOSIS — Z5189 Encounter for other specified aftercare: Secondary | ICD-10-CM | POA: Diagnosis not present

## 2016-05-15 DIAGNOSIS — R262 Difficulty in walking, not elsewhere classified: Secondary | ICD-10-CM | POA: Diagnosis not present

## 2016-05-15 DIAGNOSIS — M25551 Pain in right hip: Secondary | ICD-10-CM | POA: Diagnosis not present

## 2016-05-15 DIAGNOSIS — R2681 Unsteadiness on feet: Secondary | ICD-10-CM | POA: Diagnosis not present

## 2016-05-15 DIAGNOSIS — G8929 Other chronic pain: Secondary | ICD-10-CM | POA: Diagnosis not present

## 2016-05-15 DIAGNOSIS — M6281 Muscle weakness (generalized): Secondary | ICD-10-CM | POA: Diagnosis not present

## 2016-05-15 DIAGNOSIS — D649 Anemia, unspecified: Secondary | ICD-10-CM | POA: Diagnosis not present

## 2016-05-15 DIAGNOSIS — Z981 Arthrodesis status: Secondary | ICD-10-CM | POA: Diagnosis not present

## 2016-05-16 ENCOUNTER — Encounter: Payer: Self-pay | Admitting: Adult Health

## 2016-05-16 ENCOUNTER — Non-Acute Institutional Stay (SKILLED_NURSING_FACILITY): Payer: PPO | Admitting: Adult Health

## 2016-05-16 DIAGNOSIS — K5901 Slow transit constipation: Secondary | ICD-10-CM

## 2016-05-16 DIAGNOSIS — M1990 Unspecified osteoarthritis, unspecified site: Secondary | ICD-10-CM | POA: Diagnosis not present

## 2016-05-16 DIAGNOSIS — M1611 Unilateral primary osteoarthritis, right hip: Secondary | ICD-10-CM

## 2016-05-16 DIAGNOSIS — Z789 Other specified health status: Secondary | ICD-10-CM

## 2016-05-16 DIAGNOSIS — R5381 Other malaise: Secondary | ICD-10-CM | POA: Diagnosis not present

## 2016-05-16 DIAGNOSIS — D638 Anemia in other chronic diseases classified elsewhere: Secondary | ICD-10-CM | POA: Diagnosis not present

## 2016-05-16 DIAGNOSIS — N400 Enlarged prostate without lower urinary tract symptoms: Secondary | ICD-10-CM

## 2016-05-16 DIAGNOSIS — E43 Unspecified severe protein-calorie malnutrition: Secondary | ICD-10-CM | POA: Diagnosis not present

## 2016-05-16 DIAGNOSIS — Z7289 Other problems related to lifestyle: Secondary | ICD-10-CM

## 2016-05-16 DIAGNOSIS — Z96641 Presence of right artificial hip joint: Secondary | ICD-10-CM | POA: Diagnosis not present

## 2016-05-16 DIAGNOSIS — E876 Hypokalemia: Secondary | ICD-10-CM

## 2016-05-16 DIAGNOSIS — R600 Localized edema: Secondary | ICD-10-CM

## 2016-05-16 DIAGNOSIS — A4902 Methicillin resistant Staphylococcus aureus infection, unspecified site: Secondary | ICD-10-CM | POA: Diagnosis not present

## 2016-05-16 NOTE — Progress Notes (Signed)
Patient ID: Jesus Daniel, male   DOB: 10-06-1946, 70 y.o.   MRN: [id number]    DATE:  05/16/2016   MRN:  [id number]  BIRTHDAY: 1946-06-24  Facility:  Nursing Home Location:  [place] Room Number: 907-P  LEVEL OF CARE:  SNF (31)  Contact Information    Name Relation Home Work Mobile   [place] "[place]" Son   [phone]   Amardeep, Zerba   [phone]       Code Status History    Date Active Date Inactive Code Status Order ID Comments User Context   03/31/2016  5:50 PM 04/05/2016  7:58 PM Full Code [id number]  Riccardo Dubin, MD Inpatient   10/18/2015 12:48 PM 10/20/2015  6:57 PM Full Code [id number]  Aundra Dubin, PA-C Inpatient       Chief Complaint  Patient presents with  . Discharge Note    HISTORY OF PRESENT ILLNESS:  This is a 70 year old male who is for discharge home with Home health PT, OT, CNA and Nurse.  He will stop his Vancomycin on 6/09 and then will start Doxycycline 100 mg BID from 05/18/16. He will need 2 sets of blood culture on 05/20/16 and Dr. Johnnye Sima, Infectious Disease,  to determine stop date of Doxycycline. ATBs is for right hip abcess MRSA. He was recently started Procel for protein-calorie malnutrition. He was, also, started on Melatonin  for insomnia and Robaxin for muscle spasm.  He has been admitted to [facility] on 04/05/16 from [facility]. 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 He has been having multiple falls and pain on his right hip. CT right hip demonstrated extraarticular fluid collection, infection and loosening of the acetabular component of the hip prosthesis. He had a CT guided aspiration of right hip fluid collection demonstrated staph aureus, which was MRSA. He was treated with IV Vancomycin which was continued after the sensitivities were confirmed. Orthopedic surgery was consulted who recommended non-operative management,  and joint aspiration was deferred to prevent seeding of the joint by any remaining abscess. Infectious Disease was consulted, who recommended at least 6 weeks of IV Vancomycin and antibiotics indefinitely pending operative management.   He has indicated that he drinks 6 beers a day but did not exhibit signs of alcohol withdrawal in the hospital.  Patint was admitted to this facility for short-term rehabilitation after the patient's recent hospitalization.  Patient has completed SNF rehabilitation and therapy has cleared the patient for discharge.   PAST MEDICAL HISTORY:  Past Medical History  Diagnosis Date  . GERD (gastroesophageal reflux disease)   . Depression   . Anxiety   . Cancer ([place])     skin- melanoma.   . Urinary hesitancy   . Arthritis     "probably in my knees" (10/18/2015)  . History of gout   . MRSA (methicillin resistant staph aureus) culture positive 03-2016    Right hip abscess  . Alcohol use disorder ([place])   . BPH (benign prostatic hyperplasia)   . Physical deconditioning   . Bilateral edema of lower extremity   . Slow transit constipation   . Hypokalemia   . Anemia of chronic disease   . Protein calorie malnutrition ([place])      CURRENT MEDICATIONS: Reviewed  Patient's Medications  New Prescriptions   No medications on file  Previous Medications   ACETAMINOPHEN (TYLENOL) 500 MG TABLET    Take  500 mg by mouth every 6 (six) hours as needed.   DOCUSATE SODIUM (COLACE) 100 MG CAPSULE    Take 100 mg by mouth daily.   DOXYCYCLINE (VIBRA-TABS) 100 MG TABLET    Take 100 mg by mouth 2 (two) times daily.   ENOXAPARIN (LOVENOX) 40 MG/0.4ML INJECTION    Inject 0.4 mLs (40 mg total) into the skin daily.   FUROSEMIDE (LASIX) 40 MG TABLET    Take 40 mg by mouth 2 (two) times daily as needed for fluid.    HEPARIN 5,000 UNITS IN SODIUM CHLORIDE 0.9 % 50 ML    Inject 10 mLs as directed 2 (two) times daily.   HYDROCERIN (EUCERIN) CREA    Apply 1 application topically 2 (two)  times daily.   HYDROXYZINE (ATARAX/VISTARIL) 10 MG TABLET    Take 1 tablet (10 mg total) by mouth every 6 (six) hours as needed for itching.   MELATONIN 5 MG TABS    Take 1 tablet by mouth daily. At 9PM   METHOCARBAMOL (ROBAXIN) 500 MG TABLET    Take 500 mg by mouth every 8 (eight) hours as needed for muscle spasms.   OXYCODONE (OXY IR/ROXICODONE) 5 MG IMMEDIATE RELEASE TABLET    Take one tablet by mouth every 4 hours as needed for moderate-severe pain *Hold for sedation*   POLYETHYLENE GLYCOL (MIRALAX / GLYCOLAX) PACKET    Take 17 g by mouth daily as needed.   POTASSIUM CHLORIDE (K-DUR) 10 MEQ TABLET    Take 10 mEq by mouth daily.   PROTEIN (PROCEL) POWD    Take 2 scoop by mouth 2 (two) times daily.   SODIUM CHLORIDE FLUSH (NORMAL SALINE FLUSH IV)    Inject 10 mLs into the vein. Before and after IV antibiotics   TAMSULOSIN (FLOMAX) 0.4 MG CAPS CAPSULE    Take 0.4 mg by mouth daily.   VANCOMYCIN HCL IN NACL 1-0.9 GM/200ML-% SOLN    Inject into the vein every 8 (eight) hours. Until 05/17/2016  Modified Medications   No medications on file  Discontinued Medications   No medications on file     No Known Allergies   REVIEW OF SYSTEMS:  GENERAL: no change in appetite, no fatigue, no weight changes, no fever, chills or weakness SKIN: Denies rash, itching, wounds, ulcer sores, or nail abnormality EYES: Denies change in vision, dry eyes, eye pain, itching or discharge EARS: Denies change in hearing, ringing in ears, or earache NOSE: Denies nasal congestion or epistaxis MOUTH and THROAT: Denies oral discomfort, gingival pain or bleeding, pain from teeth or hoarseness   RESPIRATORY: no cough, SOB, DOE, wheezing, hemoptysis CARDIAC: no chest pain, edema or palpitations GI: no abdominal pain, diarrhea, constipation, heart burn, nausea or vomiting GU: Denies dysuria, frequency, hematuria, incontinence, or discharge PSYCHIATRIC: Denies feeling of depression or anxiety. No report of hallucinations,  insomnia, paranoia, or agitation   PHYSICAL EXAMINATION:  GENERAL APPEARANCE: Well nourished. In no acute distress. Normal body habitus SKIN:  Right hip scar from surgery, right foot has callus with serous drainage, covered with dry dressing HEAD: Normal in size and contour. No evidence of trauma EYES: Right eye prosthesis EARS: Pinnae are normal. Patient hears normal voice tunes of the examiner MOUTH and THROAT: Lips are without lesions. Oral mucosa is moist and without lesions. Tongue is normal in shape, size, and color and without lesions NECK: supple, trachea midline, no neck masses, no thyroid tenderness, no thyromegaly LYMPHATICS: no LAN in the neck, no supraclavicular LAN RESPIRATORY: breathing  is even & unlabored, BS CTAB CARDIAC: RRR, no murmur,no extra heart sounds, BLE edema 1+; right upper arm SL PICC GI: abdomen soft, normal BS, no masses, no tenderness, no hepatomegaly, no splenomegaly EXTREMITIES:  Able to move X 4 extremities PSYCHIATRIC: Alert and oriented X 3. Affect and behavior are appropriate  LABS/RADIOLOGY: Labs reviewed: Basic Metabolic Panel:  Recent Labs  04/02/16 0310 04/03/16 0403  04/04/16 04/04/16 0558 04/05/16 0500 04/09/16  04/25/16 05/01/16 05/13/16  NA 133* 133*  --  134* 134*  --  139  --   --   --   --   K 3.3* 3.6  --   --  4.1  --  3.9  --   --   --   --   CL 95* 98*  --   --  96*  --   --   --   --   --   --   CO2 28 28  --   --  30  --   --   --   --   --   --   GLUCOSE 103* 110*  --   --  103*  --   --   --   --   --   --   BUN <5* <5*  --   --  <5*  --  6  < > 6 8 6   CREATININE 0.61 0.64  < > 0.6 0.65 0.72 0.8  < > 0.8 0.8 0.8  CALCIUM 8.4* 8.4*  --   --  8.3*  --   --   --   --   --   --   MG 1.7  --   --   --  1.9 1.7  --   --   --   --   --   < > = values in this interval not displayed. Liver Function Tests:  Recent Labs  10/09/15 1159 04/09/16  AST 34 31  ALT 11* 17  ALKPHOS 101 112  BILITOT 0.7  --   PROT 7.0  --    ALBUMIN 2.8*  --    CBC:  Recent Labs  10/09/15 1159  03/31/16 1807 04/03/16 0403 04/04/16 0558 04/05/16 04/05/16 0500 04/09/16  WBC 7.4  < > 8.2 4.2 4.7 4.2 4.2 4.1  NEUTROABS 5.1  --  5.3  --   --   --   --  2  HGB 10.1*  < > 12.1* 9.5* 10.1*  --  9.6* 9.8*  HCT 31.7*  < > 36.6* 28.8* 29.9*  --  29.7* 30*  MCV 99.1  < > 94.6 98.0 98.0  --  97.4  --   PLT 311  < > 281 186 205  --  230 226  < > = values in this interval not displayed.    ASSESSMENT/PLAN:  Physical Deconditioning - Home health PT, OT, CNA and Nursing  Right hip MRSA abscess - continue Vancomycin 1,000 mg IV Q 8 hours till May 17, 2016 then start  Doxycycline 100 mg PO BID from 05/18/16 X 10 days; follow-up with Infectious Disease, Dr. Johnnye Sima,   will need 2 sets of blood culture on 05/20/16  Osteoarthritis of right hip S/P right hip arthroplasty - for Home health PT, OT, CNA and Nurse; continue Oxycodone 5 mg 1 tab Q 4 hours  PRN and Tylenol extra strength 500 mg 1 tab by mouth every 6 hours for pain;  Robaxin 500 mg 1 tab by mouth  every 8 hours when necessary for muscle spasm  Lower extremity edema - continue Lasix 40 mg 1 tab PO BID PRN  Constipation - continue Miralax 17 gm daily and Colace 100 mg 1 capsule by mouth daily  Hypokalemia - continue K-Dur  10 meq PO daily Lab Results  Component Value Date   K 3.9 04/09/2016    BPH - continue Flomax 0.4 mg 1 capsule daily  Anemia of chronic disease -  stable Lab Results  Component Value Date   WBC 4.1 04/09/2016   HGB 9.8* 04/09/2016   HCT 30* 04/09/2016   MCV 97.4 04/05/2016   PLT 226 04/09/2016    Alcohol use disorder - monitor for withdrawal; counseling regarding alcohol cessation   Protein calorie malnutrition, severe - albumin 2.44; continue Procel 2 scoops by mouth twice a day  Right foot dorsum pressure ulcer - has white callus with slight serous drainage with dry dressing, ABI ordered but report showed not obtainable, will order repeat ABI  before discharge; Home health Nurse to do wound care and medication management     I have filled out patient's discharge paperwork and written prescriptions.  Patient will receive home health PT, OT, Nursing and CNA.  Total discharge time: Greater than 30 minutes  Discharge time involved coordination of the discharge process with social worker, nursing staff and therapy department. Medical justification for home health services verified.    Durenda Age, NP [employer] [phone]

## 2016-05-19 DIAGNOSIS — T84030D Mechanical loosening of internal right hip prosthetic joint, subsequent encounter: Secondary | ICD-10-CM | POA: Diagnosis not present

## 2016-05-19 DIAGNOSIS — I878 Other specified disorders of veins: Secondary | ICD-10-CM | POA: Diagnosis not present

## 2016-05-19 DIAGNOSIS — G4700 Insomnia, unspecified: Secondary | ICD-10-CM | POA: Diagnosis not present

## 2016-05-19 DIAGNOSIS — T8451XD Infection and inflammatory reaction due to internal right hip prosthesis, subsequent encounter: Secondary | ICD-10-CM | POA: Diagnosis not present

## 2016-05-19 DIAGNOSIS — F419 Anxiety disorder, unspecified: Secondary | ICD-10-CM | POA: Diagnosis not present

## 2016-05-19 DIAGNOSIS — L89612 Pressure ulcer of right heel, stage 2: Secondary | ICD-10-CM | POA: Diagnosis not present

## 2016-05-19 DIAGNOSIS — B9562 Methicillin resistant Staphylococcus aureus infection as the cause of diseases classified elsewhere: Secondary | ICD-10-CM | POA: Diagnosis not present

## 2016-05-19 DIAGNOSIS — D638 Anemia in other chronic diseases classified elsewhere: Secondary | ICD-10-CM | POA: Diagnosis not present

## 2016-05-19 DIAGNOSIS — I10 Essential (primary) hypertension: Secondary | ICD-10-CM | POA: Diagnosis not present

## 2016-05-19 DIAGNOSIS — M62838 Other muscle spasm: Secondary | ICD-10-CM | POA: Diagnosis not present

## 2016-05-19 DIAGNOSIS — M00051 Staphylococcal arthritis, right hip: Secondary | ICD-10-CM | POA: Diagnosis not present

## 2016-05-19 DIAGNOSIS — F329 Major depressive disorder, single episode, unspecified: Secondary | ICD-10-CM | POA: Diagnosis not present

## 2016-05-19 DIAGNOSIS — E43 Unspecified severe protein-calorie malnutrition: Secondary | ICD-10-CM | POA: Diagnosis not present

## 2016-05-21 DIAGNOSIS — I10 Essential (primary) hypertension: Secondary | ICD-10-CM | POA: Diagnosis not present

## 2016-05-21 DIAGNOSIS — F329 Major depressive disorder, single episode, unspecified: Secondary | ICD-10-CM | POA: Diagnosis not present

## 2016-05-21 DIAGNOSIS — L89612 Pressure ulcer of right heel, stage 2: Secondary | ICD-10-CM | POA: Diagnosis not present

## 2016-05-21 DIAGNOSIS — T8451XD Infection and inflammatory reaction due to internal right hip prosthesis, subsequent encounter: Secondary | ICD-10-CM | POA: Diagnosis not present

## 2016-05-21 DIAGNOSIS — F419 Anxiety disorder, unspecified: Secondary | ICD-10-CM | POA: Diagnosis not present

## 2016-05-21 DIAGNOSIS — G4700 Insomnia, unspecified: Secondary | ICD-10-CM | POA: Diagnosis not present

## 2016-05-21 DIAGNOSIS — I878 Other specified disorders of veins: Secondary | ICD-10-CM | POA: Diagnosis not present

## 2016-05-21 DIAGNOSIS — T84030D Mechanical loosening of internal right hip prosthetic joint, subsequent encounter: Secondary | ICD-10-CM | POA: Diagnosis not present

## 2016-05-21 DIAGNOSIS — E43 Unspecified severe protein-calorie malnutrition: Secondary | ICD-10-CM | POA: Diagnosis not present

## 2016-05-21 DIAGNOSIS — M00051 Staphylococcal arthritis, right hip: Secondary | ICD-10-CM | POA: Diagnosis not present

## 2016-05-21 DIAGNOSIS — B9562 Methicillin resistant Staphylococcus aureus infection as the cause of diseases classified elsewhere: Secondary | ICD-10-CM | POA: Diagnosis not present

## 2016-05-21 DIAGNOSIS — D638 Anemia in other chronic diseases classified elsewhere: Secondary | ICD-10-CM | POA: Diagnosis not present

## 2016-05-21 DIAGNOSIS — M62838 Other muscle spasm: Secondary | ICD-10-CM | POA: Diagnosis not present

## 2016-05-22 DIAGNOSIS — Z970 Presence of artificial eye: Secondary | ICD-10-CM | POA: Diagnosis not present

## 2016-05-22 DIAGNOSIS — Z125 Encounter for screening for malignant neoplasm of prostate: Secondary | ICD-10-CM | POA: Diagnosis not present

## 2016-05-22 DIAGNOSIS — Z872 Personal history of diseases of the skin and subcutaneous tissue: Secondary | ICD-10-CM | POA: Diagnosis not present

## 2016-05-22 DIAGNOSIS — R030 Elevated blood-pressure reading, without diagnosis of hypertension: Secondary | ICD-10-CM | POA: Diagnosis not present

## 2016-05-22 DIAGNOSIS — R319 Hematuria, unspecified: Secondary | ICD-10-CM | POA: Diagnosis not present

## 2016-05-22 DIAGNOSIS — M150 Primary generalized (osteo)arthritis: Secondary | ICD-10-CM | POA: Diagnosis not present

## 2016-05-22 DIAGNOSIS — Z8639 Personal history of other endocrine, nutritional and metabolic disease: Secondary | ICD-10-CM | POA: Diagnosis not present

## 2016-05-22 DIAGNOSIS — E785 Hyperlipidemia, unspecified: Secondary | ICD-10-CM | POA: Diagnosis not present

## 2016-05-22 DIAGNOSIS — E291 Testicular hypofunction: Secondary | ICD-10-CM | POA: Diagnosis not present

## 2016-05-22 DIAGNOSIS — R600 Localized edema: Secondary | ICD-10-CM | POA: Diagnosis not present

## 2016-05-22 DIAGNOSIS — R7989 Other specified abnormal findings of blood chemistry: Secondary | ICD-10-CM | POA: Diagnosis not present

## 2016-05-24 DIAGNOSIS — L89619 Pressure ulcer of right heel, unspecified stage: Secondary | ICD-10-CM | POA: Diagnosis not present

## 2016-05-27 DIAGNOSIS — D638 Anemia in other chronic diseases classified elsewhere: Secondary | ICD-10-CM | POA: Diagnosis not present

## 2016-05-27 DIAGNOSIS — M00051 Staphylococcal arthritis, right hip: Secondary | ICD-10-CM | POA: Diagnosis not present

## 2016-05-27 DIAGNOSIS — I10 Essential (primary) hypertension: Secondary | ICD-10-CM | POA: Diagnosis not present

## 2016-05-27 DIAGNOSIS — T84030D Mechanical loosening of internal right hip prosthetic joint, subsequent encounter: Secondary | ICD-10-CM | POA: Diagnosis not present

## 2016-05-27 DIAGNOSIS — F419 Anxiety disorder, unspecified: Secondary | ICD-10-CM | POA: Diagnosis not present

## 2016-05-27 DIAGNOSIS — E43 Unspecified severe protein-calorie malnutrition: Secondary | ICD-10-CM | POA: Diagnosis not present

## 2016-05-27 DIAGNOSIS — M62838 Other muscle spasm: Secondary | ICD-10-CM | POA: Diagnosis not present

## 2016-05-27 DIAGNOSIS — T8451XD Infection and inflammatory reaction due to internal right hip prosthesis, subsequent encounter: Secondary | ICD-10-CM | POA: Diagnosis not present

## 2016-05-27 DIAGNOSIS — B9562 Methicillin resistant Staphylococcus aureus infection as the cause of diseases classified elsewhere: Secondary | ICD-10-CM | POA: Diagnosis not present

## 2016-05-27 DIAGNOSIS — F329 Major depressive disorder, single episode, unspecified: Secondary | ICD-10-CM | POA: Diagnosis not present

## 2016-05-27 DIAGNOSIS — G4700 Insomnia, unspecified: Secondary | ICD-10-CM | POA: Diagnosis not present

## 2016-05-27 DIAGNOSIS — I878 Other specified disorders of veins: Secondary | ICD-10-CM | POA: Diagnosis not present

## 2016-05-27 DIAGNOSIS — L89612 Pressure ulcer of right heel, stage 2: Secondary | ICD-10-CM | POA: Diagnosis not present

## 2016-06-03 DIAGNOSIS — T8451XD Infection and inflammatory reaction due to internal right hip prosthesis, subsequent encounter: Secondary | ICD-10-CM | POA: Diagnosis not present

## 2016-06-03 DIAGNOSIS — G4700 Insomnia, unspecified: Secondary | ICD-10-CM | POA: Diagnosis not present

## 2016-06-03 DIAGNOSIS — F419 Anxiety disorder, unspecified: Secondary | ICD-10-CM | POA: Diagnosis not present

## 2016-06-03 DIAGNOSIS — D638 Anemia in other chronic diseases classified elsewhere: Secondary | ICD-10-CM | POA: Diagnosis not present

## 2016-06-03 DIAGNOSIS — I10 Essential (primary) hypertension: Secondary | ICD-10-CM | POA: Diagnosis not present

## 2016-06-03 DIAGNOSIS — L89612 Pressure ulcer of right heel, stage 2: Secondary | ICD-10-CM | POA: Diagnosis not present

## 2016-06-03 DIAGNOSIS — F329 Major depressive disorder, single episode, unspecified: Secondary | ICD-10-CM | POA: Diagnosis not present

## 2016-06-03 DIAGNOSIS — M62838 Other muscle spasm: Secondary | ICD-10-CM | POA: Diagnosis not present

## 2016-06-03 DIAGNOSIS — I878 Other specified disorders of veins: Secondary | ICD-10-CM | POA: Diagnosis not present

## 2016-06-03 DIAGNOSIS — M00051 Staphylococcal arthritis, right hip: Secondary | ICD-10-CM | POA: Diagnosis not present

## 2016-06-03 DIAGNOSIS — E43 Unspecified severe protein-calorie malnutrition: Secondary | ICD-10-CM | POA: Diagnosis not present

## 2016-06-03 DIAGNOSIS — B9562 Methicillin resistant Staphylococcus aureus infection as the cause of diseases classified elsewhere: Secondary | ICD-10-CM | POA: Diagnosis not present

## 2016-06-03 DIAGNOSIS — T84030D Mechanical loosening of internal right hip prosthetic joint, subsequent encounter: Secondary | ICD-10-CM | POA: Diagnosis not present

## 2016-06-09 DIAGNOSIS — E43 Unspecified severe protein-calorie malnutrition: Secondary | ICD-10-CM | POA: Diagnosis not present

## 2016-06-09 DIAGNOSIS — G4700 Insomnia, unspecified: Secondary | ICD-10-CM | POA: Diagnosis not present

## 2016-06-09 DIAGNOSIS — D638 Anemia in other chronic diseases classified elsewhere: Secondary | ICD-10-CM | POA: Diagnosis not present

## 2016-06-09 DIAGNOSIS — M62838 Other muscle spasm: Secondary | ICD-10-CM | POA: Diagnosis not present

## 2016-06-09 DIAGNOSIS — F329 Major depressive disorder, single episode, unspecified: Secondary | ICD-10-CM | POA: Diagnosis not present

## 2016-06-09 DIAGNOSIS — M00051 Staphylococcal arthritis, right hip: Secondary | ICD-10-CM | POA: Diagnosis not present

## 2016-06-09 DIAGNOSIS — I10 Essential (primary) hypertension: Secondary | ICD-10-CM | POA: Diagnosis not present

## 2016-06-09 DIAGNOSIS — F419 Anxiety disorder, unspecified: Secondary | ICD-10-CM | POA: Diagnosis not present

## 2016-06-09 DIAGNOSIS — T84030D Mechanical loosening of internal right hip prosthetic joint, subsequent encounter: Secondary | ICD-10-CM | POA: Diagnosis not present

## 2016-06-09 DIAGNOSIS — I878 Other specified disorders of veins: Secondary | ICD-10-CM | POA: Diagnosis not present

## 2016-06-09 DIAGNOSIS — T8451XD Infection and inflammatory reaction due to internal right hip prosthesis, subsequent encounter: Secondary | ICD-10-CM | POA: Diagnosis not present

## 2016-06-09 DIAGNOSIS — B9562 Methicillin resistant Staphylococcus aureus infection as the cause of diseases classified elsewhere: Secondary | ICD-10-CM | POA: Diagnosis not present

## 2016-06-09 DIAGNOSIS — L89612 Pressure ulcer of right heel, stage 2: Secondary | ICD-10-CM | POA: Diagnosis not present

## 2016-06-14 DIAGNOSIS — B9562 Methicillin resistant Staphylococcus aureus infection as the cause of diseases classified elsewhere: Secondary | ICD-10-CM | POA: Diagnosis not present

## 2016-06-14 DIAGNOSIS — F419 Anxiety disorder, unspecified: Secondary | ICD-10-CM | POA: Diagnosis not present

## 2016-06-14 DIAGNOSIS — I10 Essential (primary) hypertension: Secondary | ICD-10-CM | POA: Diagnosis not present

## 2016-06-14 DIAGNOSIS — I878 Other specified disorders of veins: Secondary | ICD-10-CM | POA: Diagnosis not present

## 2016-06-14 DIAGNOSIS — E43 Unspecified severe protein-calorie malnutrition: Secondary | ICD-10-CM | POA: Diagnosis not present

## 2016-06-14 DIAGNOSIS — Z792 Long term (current) use of antibiotics: Secondary | ICD-10-CM | POA: Diagnosis not present

## 2016-06-14 DIAGNOSIS — G4700 Insomnia, unspecified: Secondary | ICD-10-CM | POA: Diagnosis not present

## 2016-06-14 DIAGNOSIS — F329 Major depressive disorder, single episode, unspecified: Secondary | ICD-10-CM | POA: Diagnosis not present

## 2016-06-14 DIAGNOSIS — L89612 Pressure ulcer of right heel, stage 2: Secondary | ICD-10-CM | POA: Diagnosis not present

## 2016-06-14 DIAGNOSIS — T84030D Mechanical loosening of internal right hip prosthetic joint, subsequent encounter: Secondary | ICD-10-CM | POA: Diagnosis not present

## 2016-06-14 DIAGNOSIS — D638 Anemia in other chronic diseases classified elsewhere: Secondary | ICD-10-CM | POA: Diagnosis not present

## 2016-06-14 DIAGNOSIS — M00051 Staphylococcal arthritis, right hip: Secondary | ICD-10-CM | POA: Diagnosis not present

## 2016-06-14 DIAGNOSIS — T8451XD Infection and inflammatory reaction due to internal right hip prosthesis, subsequent encounter: Secondary | ICD-10-CM | POA: Diagnosis not present

## 2016-06-14 DIAGNOSIS — Z9181 History of falling: Secondary | ICD-10-CM | POA: Diagnosis not present

## 2016-06-14 DIAGNOSIS — W19XXXD Unspecified fall, subsequent encounter: Secondary | ICD-10-CM | POA: Diagnosis not present

## 2016-06-14 DIAGNOSIS — F17220 Nicotine dependence, chewing tobacco, uncomplicated: Secondary | ICD-10-CM | POA: Diagnosis not present

## 2016-06-14 DIAGNOSIS — F1010 Alcohol abuse, uncomplicated: Secondary | ICD-10-CM | POA: Diagnosis not present

## 2016-06-14 DIAGNOSIS — M62838 Other muscle spasm: Secondary | ICD-10-CM | POA: Diagnosis not present

## 2016-06-16 ENCOUNTER — Other Ambulatory Visit: Payer: Self-pay | Admitting: Adult Health

## 2016-06-18 DIAGNOSIS — Z96641 Presence of right artificial hip joint: Secondary | ICD-10-CM | POA: Diagnosis not present

## 2016-06-20 DIAGNOSIS — M00051 Staphylococcal arthritis, right hip: Secondary | ICD-10-CM | POA: Diagnosis not present

## 2016-06-20 DIAGNOSIS — Z9181 History of falling: Secondary | ICD-10-CM | POA: Diagnosis not present

## 2016-06-20 DIAGNOSIS — I10 Essential (primary) hypertension: Secondary | ICD-10-CM | POA: Diagnosis not present

## 2016-06-20 DIAGNOSIS — W19XXXD Unspecified fall, subsequent encounter: Secondary | ICD-10-CM | POA: Diagnosis not present

## 2016-06-20 DIAGNOSIS — L89612 Pressure ulcer of right heel, stage 2: Secondary | ICD-10-CM | POA: Diagnosis not present

## 2016-06-20 DIAGNOSIS — F419 Anxiety disorder, unspecified: Secondary | ICD-10-CM | POA: Diagnosis not present

## 2016-06-20 DIAGNOSIS — E43 Unspecified severe protein-calorie malnutrition: Secondary | ICD-10-CM | POA: Diagnosis not present

## 2016-06-20 DIAGNOSIS — I878 Other specified disorders of veins: Secondary | ICD-10-CM | POA: Diagnosis not present

## 2016-06-20 DIAGNOSIS — F17220 Nicotine dependence, chewing tobacco, uncomplicated: Secondary | ICD-10-CM | POA: Diagnosis not present

## 2016-06-20 DIAGNOSIS — D638 Anemia in other chronic diseases classified elsewhere: Secondary | ICD-10-CM | POA: Diagnosis not present

## 2016-06-20 DIAGNOSIS — T84030D Mechanical loosening of internal right hip prosthetic joint, subsequent encounter: Secondary | ICD-10-CM | POA: Diagnosis not present

## 2016-06-20 DIAGNOSIS — M62838 Other muscle spasm: Secondary | ICD-10-CM | POA: Diagnosis not present

## 2016-06-20 DIAGNOSIS — B9562 Methicillin resistant Staphylococcus aureus infection as the cause of diseases classified elsewhere: Secondary | ICD-10-CM | POA: Diagnosis not present

## 2016-06-20 DIAGNOSIS — F1010 Alcohol abuse, uncomplicated: Secondary | ICD-10-CM | POA: Diagnosis not present

## 2016-06-20 DIAGNOSIS — G4700 Insomnia, unspecified: Secondary | ICD-10-CM | POA: Diagnosis not present

## 2016-06-20 DIAGNOSIS — T8451XD Infection and inflammatory reaction due to internal right hip prosthesis, subsequent encounter: Secondary | ICD-10-CM | POA: Diagnosis not present

## 2016-06-20 DIAGNOSIS — Z792 Long term (current) use of antibiotics: Secondary | ICD-10-CM | POA: Diagnosis not present

## 2016-06-20 DIAGNOSIS — F329 Major depressive disorder, single episode, unspecified: Secondary | ICD-10-CM | POA: Diagnosis not present

## 2016-06-24 ENCOUNTER — Encounter: Payer: Self-pay | Admitting: Infectious Diseases

## 2016-06-24 ENCOUNTER — Ambulatory Visit (INDEPENDENT_AMBULATORY_CARE_PROVIDER_SITE_OTHER): Payer: PPO | Admitting: Infectious Diseases

## 2016-06-24 VITALS — BP 142/84 | HR 104 | Temp 98.1°F | Wt 194.0 lb

## 2016-06-24 DIAGNOSIS — T8451XD Infection and inflammatory reaction due to internal right hip prosthesis, subsequent encounter: Secondary | ICD-10-CM | POA: Diagnosis not present

## 2016-06-24 DIAGNOSIS — E43 Unspecified severe protein-calorie malnutrition: Secondary | ICD-10-CM | POA: Diagnosis not present

## 2016-06-24 DIAGNOSIS — Z792 Long term (current) use of antibiotics: Secondary | ICD-10-CM | POA: Diagnosis not present

## 2016-06-24 DIAGNOSIS — F1010 Alcohol abuse, uncomplicated: Secondary | ICD-10-CM | POA: Diagnosis not present

## 2016-06-24 DIAGNOSIS — A4901 Methicillin susceptible Staphylococcus aureus infection, unspecified site: Secondary | ICD-10-CM | POA: Diagnosis not present

## 2016-06-24 DIAGNOSIS — M62838 Other muscle spasm: Secondary | ICD-10-CM | POA: Diagnosis not present

## 2016-06-24 DIAGNOSIS — G4700 Insomnia, unspecified: Secondary | ICD-10-CM | POA: Diagnosis not present

## 2016-06-24 DIAGNOSIS — W19XXXD Unspecified fall, subsequent encounter: Secondary | ICD-10-CM | POA: Diagnosis not present

## 2016-06-24 DIAGNOSIS — B9562 Methicillin resistant Staphylococcus aureus infection as the cause of diseases classified elsewhere: Secondary | ICD-10-CM | POA: Diagnosis not present

## 2016-06-24 DIAGNOSIS — T84030D Mechanical loosening of internal right hip prosthetic joint, subsequent encounter: Secondary | ICD-10-CM | POA: Diagnosis not present

## 2016-06-24 DIAGNOSIS — F17220 Nicotine dependence, chewing tobacco, uncomplicated: Secondary | ICD-10-CM | POA: Diagnosis not present

## 2016-06-24 DIAGNOSIS — M00051 Staphylococcal arthritis, right hip: Secondary | ICD-10-CM | POA: Diagnosis not present

## 2016-06-24 DIAGNOSIS — Z9181 History of falling: Secondary | ICD-10-CM | POA: Diagnosis not present

## 2016-06-24 DIAGNOSIS — F329 Major depressive disorder, single episode, unspecified: Secondary | ICD-10-CM | POA: Diagnosis not present

## 2016-06-24 DIAGNOSIS — I10 Essential (primary) hypertension: Secondary | ICD-10-CM | POA: Diagnosis not present

## 2016-06-24 DIAGNOSIS — D638 Anemia in other chronic diseases classified elsewhere: Secondary | ICD-10-CM | POA: Diagnosis not present

## 2016-06-24 DIAGNOSIS — I878 Other specified disorders of veins: Secondary | ICD-10-CM | POA: Diagnosis not present

## 2016-06-24 DIAGNOSIS — F419 Anxiety disorder, unspecified: Secondary | ICD-10-CM | POA: Diagnosis not present

## 2016-06-24 DIAGNOSIS — L89612 Pressure ulcer of right heel, stage 2: Secondary | ICD-10-CM | POA: Diagnosis not present

## 2016-06-24 MED ORDER — DOXYCYCLINE HYCLATE 100 MG PO TABS: 100.0000 mg | ORAL_TABLET | Freq: Two times a day (BID) | ORAL | Status: DC

## 2016-06-24 NOTE — Progress Notes (Signed)
   Subjective:    Patient ID: Jesus Daniel, male    DOB: 17-Nov-1946, 70 y.o.   MRN: [id number]  HPI 70 y.o. male with hx of R THA in November of 2016 by Dr. Percell Miller. Afterwards his wound broke down during PT, this later healed.  He then had epsiode of "getting his right leg caught under the commode" in early April.  He then came to ED on 4-23 and had CT of the hip was performed and this disclosed a hip abscess in the iliacus muscle. 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 He was treated with vanco and rifampin, d/c to SNF On 4-28. He did not have surgery.  He completed his vanco on 05-17-16. He was then changed to po doxy which was stopped on 6-20 (I was not aware of this. He states his SNF called our office for this).  Has been getting PT. Walks up and down ramp at his home. Takes a pain pill (hydrocodone) and a muscle relaxer. He only takes hydrocodone to help him sleep along with melatonin.  Denies swelling or heat in his hip. Feels like his ROM is improving, still using hand rails when on commode. Pain is improved, currently at "1' States "the membrane around the socket is forming now".  He now also has a wound on his foot where a "callus opened up".  4-25 ESR90 CRP8.4   Review of Systems  Constitutional: Negative for fever and chills.  Gastrointestinal: Negative for diarrhea and constipation.  Genitourinary: Negative for difficulty urinating.       Objective:   Physical Exam  Constitutional: He appears well-developed and well-nourished.  Musculoskeletal:       Legs:      Feet:          Assessment & Plan:

## 2016-06-24 NOTE — Assessment & Plan Note (Addendum)
He appears to be doing well.  Will restart his doxy- plan for at least 1 year.  Repeat his BCx also Will check his esr and crp Will see him back in 6 months Tried my best to answer he and his guest multiple questions.

## 2016-06-25 LAB — SEDIMENTATION RATE: Sed Rate: 80 mm/hr — ABNORMAL HIGH (ref 0–20)

## 2016-06-25 LAB — C-REACTIVE PROTEIN: CRP: 2 mg/dL — ABNORMAL HIGH (ref <0.60)

## 2016-06-28 ENCOUNTER — Telehealth: Payer: Self-pay | Admitting: *Deleted

## 2016-06-28 NOTE — Telephone Encounter (Signed)
Pt concerned about blood culture results and inflammation marks.  RN advised that the "markers" were decreasing and this indicated that the infection was improving.  RN shared that the blood culture results were not completed at this time.  Pt stated that he would call back later next week to find out about those.

## 2016-06-30 LAB — CULTURE, BLOOD (SINGLE)
ORGANISM ID, BACTERIA: NO GROWTH
ORGANISM ID, BACTERIA: NO GROWTH

## 2016-07-26 DIAGNOSIS — Z970 Presence of artificial eye: Secondary | ICD-10-CM | POA: Diagnosis not present

## 2016-07-26 DIAGNOSIS — H40052 Ocular hypertension, left eye: Secondary | ICD-10-CM | POA: Diagnosis not present

## 2016-07-26 DIAGNOSIS — H2512 Age-related nuclear cataract, left eye: Secondary | ICD-10-CM | POA: Diagnosis not present

## 2016-07-26 DIAGNOSIS — H31092 Other chorioretinal scars, left eye: Secondary | ICD-10-CM | POA: Diagnosis not present

## 2016-08-06 DIAGNOSIS — M25551 Pain in right hip: Secondary | ICD-10-CM | POA: Diagnosis not present

## 2016-08-06 DIAGNOSIS — M25522 Pain in left elbow: Secondary | ICD-10-CM | POA: Diagnosis not present

## 2016-08-22 DIAGNOSIS — Z0000 Encounter for general adult medical examination without abnormal findings: Secondary | ICD-10-CM | POA: Diagnosis not present

## 2016-08-22 DIAGNOSIS — E785 Hyperlipidemia, unspecified: Secondary | ICD-10-CM | POA: Diagnosis not present

## 2016-08-22 DIAGNOSIS — R030 Elevated blood-pressure reading, without diagnosis of hypertension: Secondary | ICD-10-CM | POA: Diagnosis not present

## 2016-08-22 DIAGNOSIS — Z23 Encounter for immunization: Secondary | ICD-10-CM | POA: Diagnosis not present

## 2016-08-22 DIAGNOSIS — F1010 Alcohol abuse, uncomplicated: Secondary | ICD-10-CM | POA: Diagnosis not present

## 2016-09-02 DIAGNOSIS — C44619 Basal cell carcinoma of skin of left upper limb, including shoulder: Secondary | ICD-10-CM | POA: Diagnosis not present

## 2016-09-02 DIAGNOSIS — C44222 Squamous cell carcinoma of skin of right ear and external auricular canal: Secondary | ICD-10-CM | POA: Diagnosis not present

## 2016-10-08 DIAGNOSIS — M25561 Pain in right knee: Secondary | ICD-10-CM | POA: Diagnosis not present

## 2016-10-08 DIAGNOSIS — M25562 Pain in left knee: Secondary | ICD-10-CM | POA: Diagnosis not present

## 2016-10-21 DIAGNOSIS — L821 Other seborrheic keratosis: Secondary | ICD-10-CM | POA: Diagnosis not present

## 2016-10-21 DIAGNOSIS — D485 Neoplasm of uncertain behavior of skin: Secondary | ICD-10-CM | POA: Diagnosis not present

## 2016-10-21 DIAGNOSIS — Z85828 Personal history of other malignant neoplasm of skin: Secondary | ICD-10-CM | POA: Diagnosis not present

## 2016-10-21 DIAGNOSIS — L570 Actinic keratosis: Secondary | ICD-10-CM | POA: Diagnosis not present

## 2016-10-21 DIAGNOSIS — L309 Dermatitis, unspecified: Secondary | ICD-10-CM | POA: Diagnosis not present

## 2016-10-22 ENCOUNTER — Telehealth: Payer: Self-pay | Admitting: *Deleted

## 2016-10-22 NOTE — Telephone Encounter (Signed)
Patient called and left a voice mail stating, "are you all open or what; I just need to talk to someone." I attempted to call patient back and got his voice mail. Advised we are having difficulty with our phone lines and if he calls back I would be happy to speak with him. Myrtis Hopping

## 2016-11-12 DIAGNOSIS — M25551 Pain in right hip: Secondary | ICD-10-CM | POA: Diagnosis not present

## 2016-11-13 ENCOUNTER — Other Ambulatory Visit (HOSPITAL_COMMUNITY): Payer: Self-pay | Admitting: Orthopedic Surgery

## 2016-11-13 DIAGNOSIS — M898X9 Other specified disorders of bone, unspecified site: Secondary | ICD-10-CM

## 2016-11-13 DIAGNOSIS — M25551 Pain in right hip: Secondary | ICD-10-CM

## 2016-11-19 ENCOUNTER — Encounter (HOSPITAL_COMMUNITY): Payer: Self-pay

## 2016-11-19 ENCOUNTER — Encounter (HOSPITAL_COMMUNITY): Admission: RE | Admit: 2016-11-19 | Payer: PPO | Source: Ambulatory Visit

## 2016-11-19 ENCOUNTER — Encounter (HOSPITAL_COMMUNITY)
Admission: RE | Admit: 2016-11-19 | Discharge: 2016-11-19 | Disposition: A | Discharge status: Home / Self Care | Payer: PPO | Source: Ambulatory Visit | Attending: Orthopedic Surgery | Admitting: Orthopedic Surgery

## 2016-11-19 DIAGNOSIS — M898X9 Other specified disorders of bone, unspecified site: Secondary | ICD-10-CM | POA: Insufficient documentation

## 2016-11-19 DIAGNOSIS — M25551 Pain in right hip: Secondary | ICD-10-CM | POA: Insufficient documentation

## 2016-11-27 ENCOUNTER — Encounter (HOSPITAL_COMMUNITY)
Admission: RE | Admit: 2016-11-27 | Discharge: 2016-11-27 | Disposition: A | Discharge status: Home / Self Care | Payer: PPO | Source: Ambulatory Visit | Attending: Orthopedic Surgery | Admitting: Orthopedic Surgery

## 2016-11-27 DIAGNOSIS — M25551 Pain in right hip: Secondary | ICD-10-CM | POA: Insufficient documentation

## 2016-11-27 DIAGNOSIS — M898X9 Other specified disorders of bone, unspecified site: Secondary | ICD-10-CM | POA: Diagnosis not present

## 2016-11-27 MED ORDER — TECHNETIUM TC 99M MEDRONATE IV KIT
25.0000 | PACK | Freq: Once | INTRAVENOUS | Status: AC | PRN
  Administered 2016-11-27: 25 via INTRAVENOUS

## 2016-12-10 DIAGNOSIS — Z85828 Personal history of other malignant neoplasm of skin: Secondary | ICD-10-CM | POA: Diagnosis not present

## 2016-12-10 DIAGNOSIS — C44222 Squamous cell carcinoma of skin of right ear and external auricular canal: Secondary | ICD-10-CM | POA: Diagnosis not present

## 2016-12-10 DIAGNOSIS — L821 Other seborrheic keratosis: Secondary | ICD-10-CM | POA: Diagnosis not present

## 2016-12-10 DIAGNOSIS — L309 Dermatitis, unspecified: Secondary | ICD-10-CM | POA: Diagnosis not present

## 2016-12-11 DIAGNOSIS — C44222 Squamous cell carcinoma of skin of right ear and external auricular canal: Secondary | ICD-10-CM | POA: Diagnosis not present

## 2016-12-13 DIAGNOSIS — M25551 Pain in right hip: Secondary | ICD-10-CM | POA: Diagnosis not present

## 2016-12-16 ENCOUNTER — Ambulatory Visit (INDEPENDENT_AMBULATORY_CARE_PROVIDER_SITE_OTHER): Payer: PPO | Admitting: Infectious Diseases

## 2016-12-16 ENCOUNTER — Encounter: Payer: Self-pay | Admitting: Infectious Diseases

## 2016-12-16 DIAGNOSIS — F1020 Alcohol dependence, uncomplicated: Secondary | ICD-10-CM | POA: Diagnosis not present

## 2016-12-16 DIAGNOSIS — T8459XD Infection and inflammatory reaction due to other internal joint prosthesis, subsequent encounter: Secondary | ICD-10-CM | POA: Diagnosis not present

## 2016-12-16 DIAGNOSIS — Z96649 Presence of unspecified artificial hip joint: Secondary | ICD-10-CM

## 2016-12-16 NOTE — Assessment & Plan Note (Signed)
He is doing ok.  I explained to him and his friend that it is unclear if he has persistence of his infection or loosening.  I also explained that we are using surrogate markers to try and follow what could be happening.  He is considering further surgery.  Will see him back in 4-6 months.

## 2016-12-16 NOTE — Assessment & Plan Note (Signed)
Counseled to drink less.

## 2016-12-16 NOTE — Progress Notes (Signed)
   Subjective:    Patient ID: Jesus Daniel, male    DOB: 01/20/1946, 70 y.o.   MRN: [id number]  HPI 71 y.o. 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 Murphy. Afterwards his wound broke down during PT, this later healed.  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 He was treated with vanco and rifampin, d/c to SNF On 4-28. He did not have surgery.  He completed his vanco on 05-17-16. He was then changed to po doxy which was stopped on 6-20 (I was not aware of this). He was seen on 06-24-16 and was restarted on doxy with a plan for 1 year.   4-25 12-13-16 ESR90 47 CRP8.4 3.5  He states his hip is continuing to bother him. That there is new bone forming around his hip.  States he has been taking his doxy without problem, no missed.  Has pain with wt bearing.   States he is drinking 3-4 beers/day. Sometimes more, sometimes less. No hard liqour.   Bone scan- 11-27-16 Increased tracer localization at the acetabular component of the of RIGHT hip prosthesis corresponding to lucencies seen surrounding the acetabular cup and anchoring screws on the prior CT exam, could either represent loosening or infection.  Increased tracer localization identified anterior to the femoral head component of the RIGHT hip prosthesis consistent with heterotopic calcification as identified on prior CT exam.  Review of Systems  Constitutional: Negative for chills, fever and unexpected weight change.       Objective:   Physical Exam  Constitutional: He appears well-developed and well-nourished.  Cardiovascular: Normal rate, regular rhythm and normal heart sounds.   Pulmonary/Chest: Effort normal and breath sounds normal.  Abdominal: Soft. Bowel sounds are normal. There is no tenderness. There is no rebound.  Musculoskeletal:        Legs:         Assessment & Plan:   

## 2017-01-15 DIAGNOSIS — C44222 Squamous cell carcinoma of skin of right ear and external auricular canal: Secondary | ICD-10-CM | POA: Diagnosis not present

## 2017-01-16 DIAGNOSIS — H2512 Age-related nuclear cataract, left eye: Secondary | ICD-10-CM | POA: Diagnosis not present

## 2017-01-16 DIAGNOSIS — H40052 Ocular hypertension, left eye: Secondary | ICD-10-CM | POA: Diagnosis not present

## 2017-01-16 DIAGNOSIS — Z970 Presence of artificial eye: Secondary | ICD-10-CM | POA: Diagnosis not present

## 2017-01-23 DIAGNOSIS — M1712 Unilateral primary osteoarthritis, left knee: Secondary | ICD-10-CM | POA: Diagnosis not present

## 2017-01-23 DIAGNOSIS — M25551 Pain in right hip: Secondary | ICD-10-CM | POA: Diagnosis not present

## 2017-01-23 DIAGNOSIS — M170 Bilateral primary osteoarthritis of knee: Secondary | ICD-10-CM | POA: Diagnosis not present

## 2017-01-23 DIAGNOSIS — Z96641 Presence of right artificial hip joint: Secondary | ICD-10-CM | POA: Diagnosis not present

## 2017-01-23 DIAGNOSIS — M1711 Unilateral primary osteoarthritis, right knee: Secondary | ICD-10-CM | POA: Diagnosis not present

## 2017-01-23 DIAGNOSIS — Z471 Aftercare following joint replacement surgery: Secondary | ICD-10-CM | POA: Diagnosis not present

## 2017-02-06 ENCOUNTER — Other Ambulatory Visit (HOSPITAL_COMMUNITY): Payer: Self-pay | Admitting: Orthopedic Surgery

## 2017-02-06 DIAGNOSIS — M25551 Pain in right hip: Secondary | ICD-10-CM

## 2017-03-04 ENCOUNTER — Encounter (HOSPITAL_COMMUNITY)
Admission: RE | Admit: 2017-03-04 | Discharge: 2017-03-04 | Disposition: A | Discharge status: Home / Self Care | Payer: PPO | Source: Ambulatory Visit | Attending: Orthopedic Surgery | Admitting: Orthopedic Surgery

## 2017-03-04 ENCOUNTER — Ambulatory Visit (HOSPITAL_COMMUNITY)
Admission: RE | Admit: 2017-03-04 | Discharge: 2017-03-04 | Disposition: A | Discharge status: Home / Self Care | Payer: PPO | Source: Ambulatory Visit | Attending: Orthopedic Surgery | Admitting: Orthopedic Surgery

## 2017-03-04 DIAGNOSIS — M25551 Pain in right hip: Secondary | ICD-10-CM | POA: Diagnosis not present

## 2017-03-04 DIAGNOSIS — Z96641 Presence of right artificial hip joint: Secondary | ICD-10-CM | POA: Insufficient documentation

## 2017-03-04 MED ORDER — TECHNETIUM TC 99M MEDRONATE IV KIT: 25.0000 | PACK | Freq: Once | INTRAVENOUS | Status: DC | PRN

## 2017-03-14 DIAGNOSIS — M25551 Pain in right hip: Secondary | ICD-10-CM | POA: Diagnosis not present

## 2017-03-31 ENCOUNTER — Other Ambulatory Visit: Payer: Self-pay | Admitting: Infectious Diseases

## 2017-03-31 DIAGNOSIS — A4901 Methicillin susceptible Staphylococcus aureus infection, unspecified site: Secondary | ICD-10-CM

## 2017-04-08 DIAGNOSIS — R030 Elevated blood-pressure reading, without diagnosis of hypertension: Secondary | ICD-10-CM | POA: Diagnosis not present

## 2017-04-08 DIAGNOSIS — E785 Hyperlipidemia, unspecified: Secondary | ICD-10-CM | POA: Diagnosis not present

## 2017-04-08 DIAGNOSIS — R945 Abnormal results of liver function studies: Secondary | ICD-10-CM | POA: Diagnosis not present

## 2017-04-08 DIAGNOSIS — R600 Localized edema: Secondary | ICD-10-CM | POA: Diagnosis not present

## 2017-04-08 DIAGNOSIS — Z9189 Other specified personal risk factors, not elsewhere classified: Secondary | ICD-10-CM | POA: Diagnosis not present

## 2017-04-22 DIAGNOSIS — M25562 Pain in left knee: Secondary | ICD-10-CM | POA: Diagnosis not present

## 2017-04-22 DIAGNOSIS — M25561 Pain in right knee: Secondary | ICD-10-CM | POA: Diagnosis not present

## 2017-04-22 DIAGNOSIS — M25551 Pain in right hip: Secondary | ICD-10-CM | POA: Diagnosis not present

## 2017-05-02 IMAGING — CT CT GUIDANCE NEEDLE PLACEMENT
1 of 4 series · 14 of 32 positions shown, 18 images · non-contrast
Comparison: none

INDICATION: 69-year-old male with a history of concern for right hip infection.
He has been referred for fluid aspiration.

[Series 2: i-spiral 5.0 b40f · axial · 0.90mm/px · z∈[-413,-189]mm · 14 of 72 slices shown, 18 images]
[im 4/72  soft-tissue]
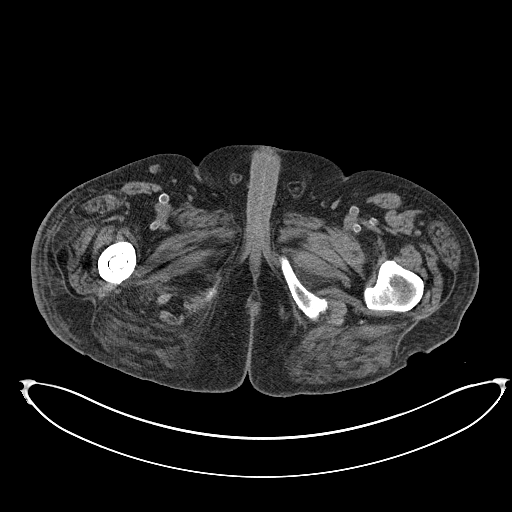
[im 4/72  bone]
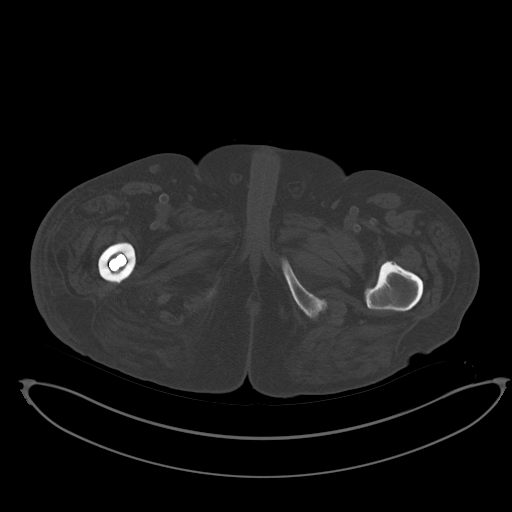
[im 10/72  soft-tissue]
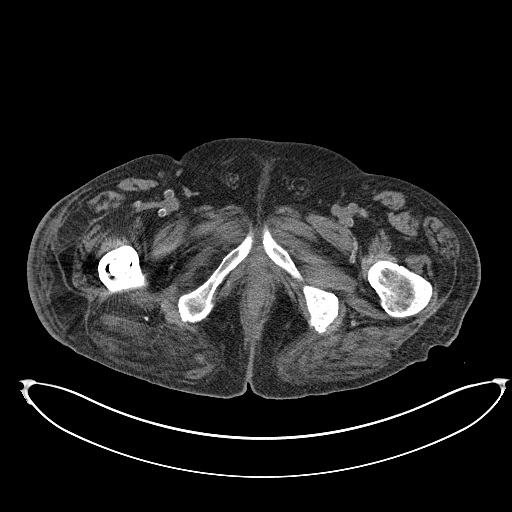
[im 17/72  soft-tissue]
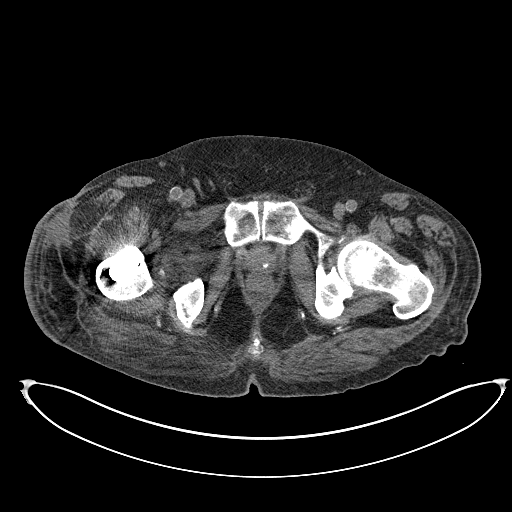
[im 23/72  soft-tissue]
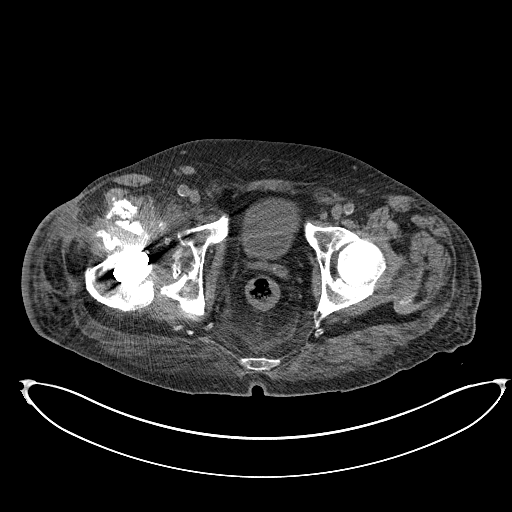
[im 26/72  soft-tissue]
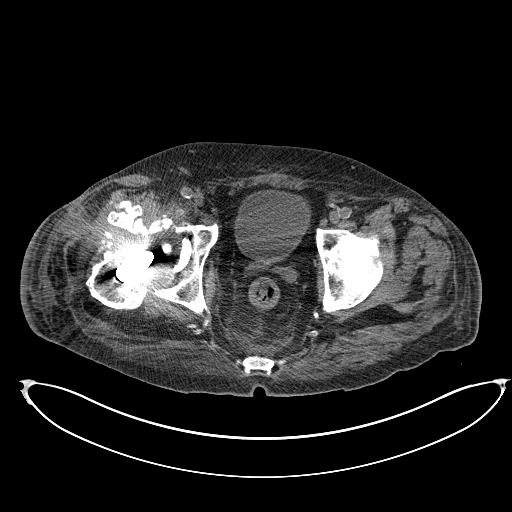
[im 33/72  soft-tissue]
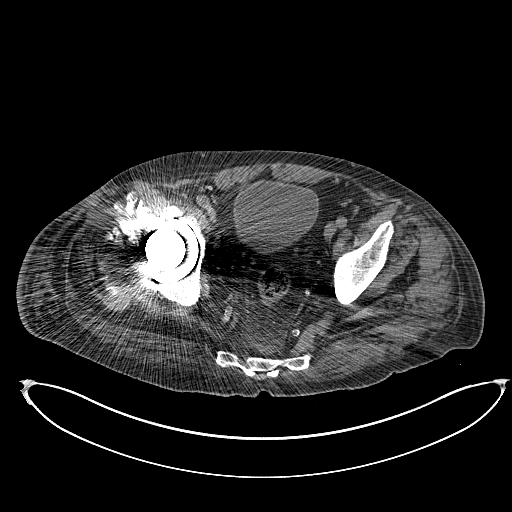
[im 39/72  soft-tissue]
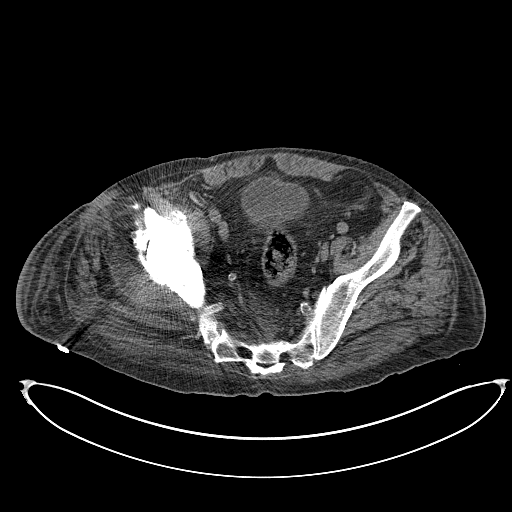
[im 46/72  soft-tissue]
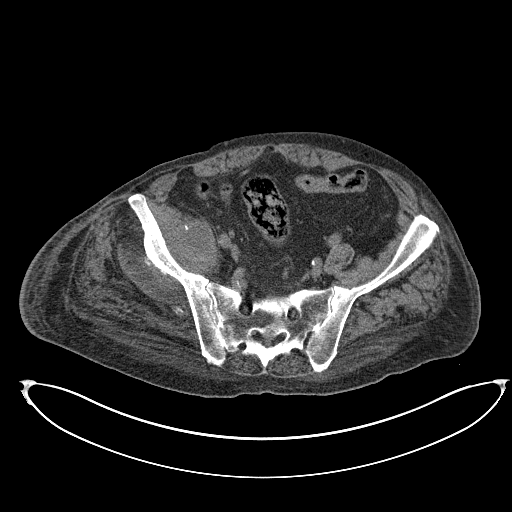
[im 49/72  soft-tissue]
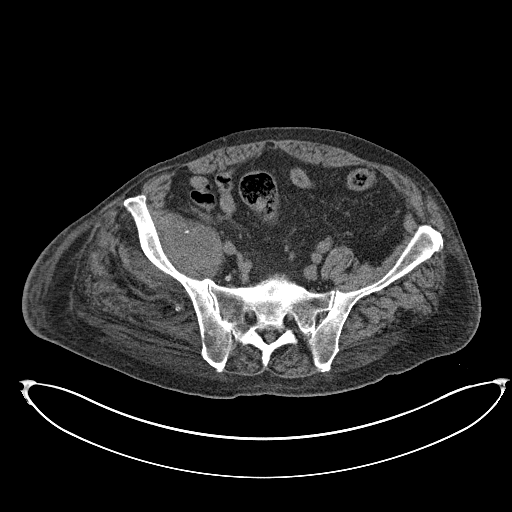
[im 49/72  bone]
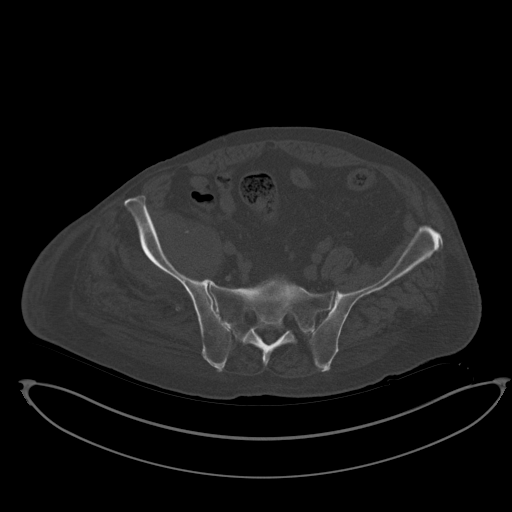
[im 55/72  soft-tissue]
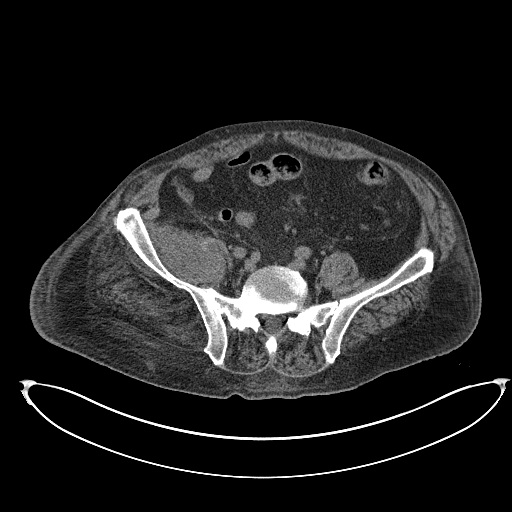
[im 59/72  lung]
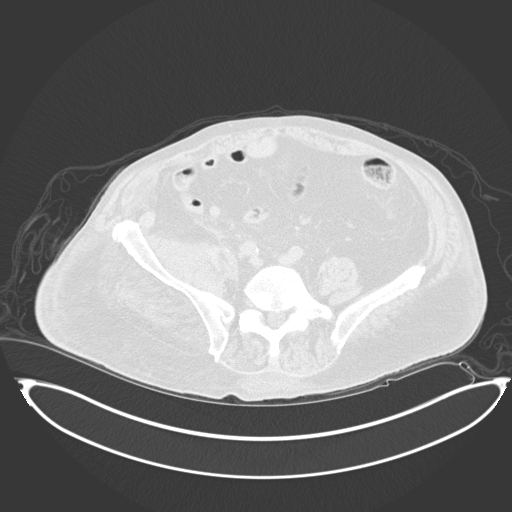
[im 62/72  soft-tissue]
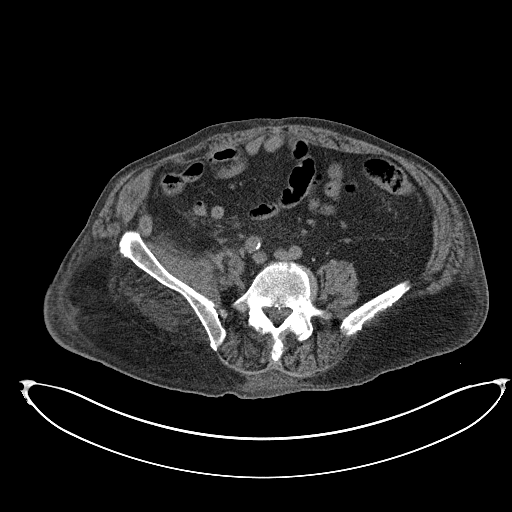
[im 62/72  lung]
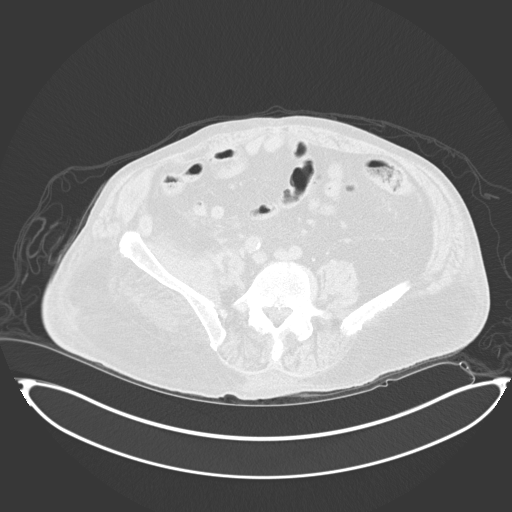
[im 65/72  lung]
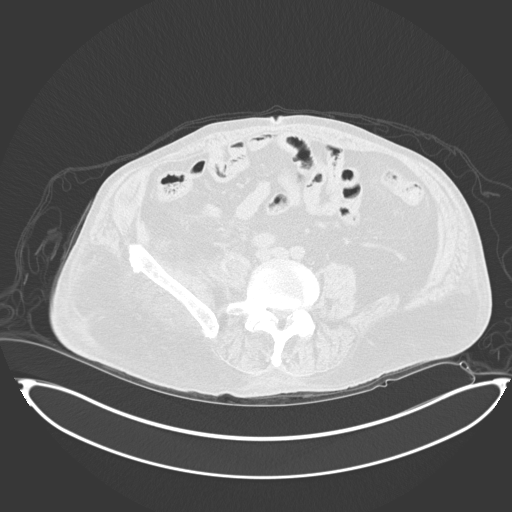
[im 68/72  soft-tissue]
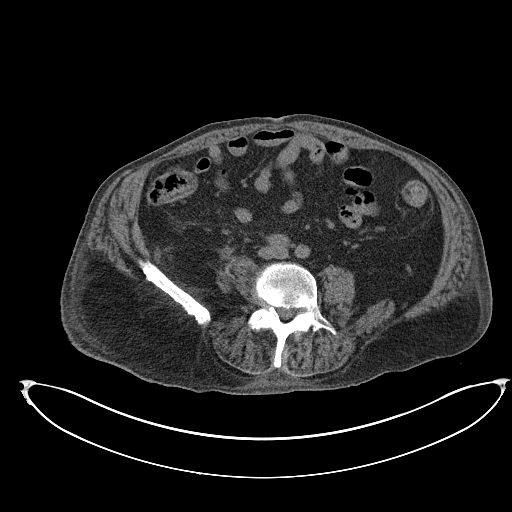
[im 68/72  lung]
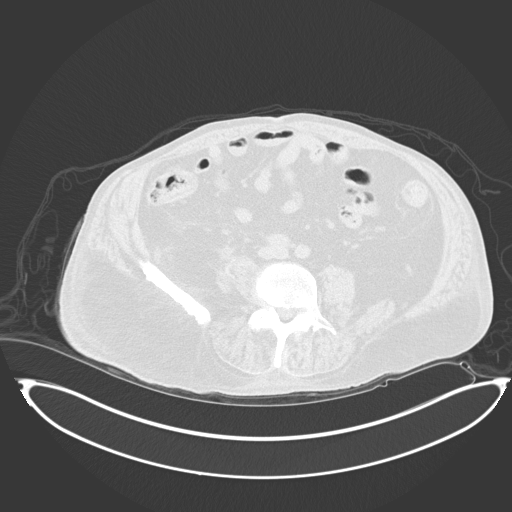

[14 of 32 positions shown; findings below may reference images not displayed]

EXAM:
CT GUIDANCE NEEDLE PLACEMENT

MEDICATIONS:
The patient is currently admitted to the hospital and receiving
intravenous antibiotics. The antibiotics were administered within an
appropriate time frame prior to the initiation of the procedure.

ANESTHESIA/SEDATION:
Fentanyl 50 mcg IV; Versed 0 mg IV

No moderate sedation with only 1 medication administered.

COMPLICATIONS:
None

PROCEDURE:
Informed written consent was obtained from the patient after a
thorough discussion of the procedural risks, benefits and
alternatives. All questions were addressed. Maximal Sterile Barrier
Technique was utilized including caps, mask, sterile gowns, sterile
gloves, sterile drape, hand hygiene and skin antiseptic. A timeout
was performed prior to the initiation of the procedure.

Patient was positioned supine position on CT gantry table and a
scout CT of the pelvis was acquired for planning purposes.

Once the patient is prepped and draped in the usual sterile fashion
adjacent to the right iliac crest, the skin and subcutaneous tissues
were generously infiltrated with 1% lidocaine for local anesthesia.
A small stab incision was made with 11 blade scalpel. A Yueh needle
was advanced into the fluid collection of the right iliacus
musculature.

Approximately 5 cc of complex fluid was aspirated.

Amplatz wire was then placed through the Yueh needle which was
exchanged for a 10 French drainage catheter.

Drainage catheter was used to aspirate another approximately 35 cc
of complex fluid. Sample was sent the lab for analysis.

All drains were removed and a sterile bandage was placed.

Patient tolerated the procedure well and remained hemodynamically
stable throughout.

No complications were encountered and no significant blood loss
encountered.
IMPRESSION: Status post CT-guided aspiration of right pelvic fluid collection
contiguous with the right hip process, with approximately 40 cc of
complex serosanguineous fluid aspirated. Sample sent the lab for
analysis.

## 2017-05-22 DIAGNOSIS — E785 Hyperlipidemia, unspecified: Secondary | ICD-10-CM | POA: Diagnosis not present

## 2017-05-22 DIAGNOSIS — Z125 Encounter for screening for malignant neoplasm of prostate: Secondary | ICD-10-CM | POA: Diagnosis not present

## 2017-05-22 DIAGNOSIS — R945 Abnormal results of liver function studies: Secondary | ICD-10-CM | POA: Diagnosis not present

## 2017-06-19 DIAGNOSIS — M25551 Pain in right hip: Secondary | ICD-10-CM | POA: Diagnosis not present

## 2017-06-19 DIAGNOSIS — A4902 Methicillin resistant Staphylococcus aureus infection, unspecified site: Secondary | ICD-10-CM | POA: Diagnosis not present

## 2017-06-24 ENCOUNTER — Telehealth: Payer: Self-pay | Admitting: *Deleted

## 2017-06-24 NOTE — Telephone Encounter (Signed)
Patient called stating he was seen by Dr. Maureen Ralphs and then referred to Dr. 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 He was told it may be 3-4 months before this surgery can be arranged. He is still on the antibiotic prescribed by Dr. Johnnye Sima. He wants to know if he should see Dr. Johnnye Sima before being scheduled for surgery. Dr. Algis Downs next available follow up appt is October. Please advise

## 2017-06-25 NOTE — Telephone Encounter (Signed)
i'd be glad to see him as an overbook in the next 2 weeks

## 2017-06-25 NOTE — Telephone Encounter (Signed)
Patient called back and he spoke to the surgeon, Dr. Cornelia Copa in [place]. They are  planning surgery tentatively for 10/08/17. He has been added to Dr. Algis Downs schedule for 07/04/17 at 9:30 AM and he is aware. Myrtis Hopping

## 2017-06-25 NOTE — Telephone Encounter (Signed)
Patient will call us back once he speaks to his surgeon.

## 2017-07-04 ENCOUNTER — Encounter: Payer: Self-pay | Admitting: Infectious Diseases

## 2017-07-04 ENCOUNTER — Ambulatory Visit (INDEPENDENT_AMBULATORY_CARE_PROVIDER_SITE_OTHER): Payer: PPO | Admitting: Infectious Diseases

## 2017-07-04 VITALS — BP 147/83 | HR 125 | Temp 98.4°F | Ht 70.0 in | Wt 193.0 lb

## 2017-07-04 DIAGNOSIS — D692 Other nonthrombocytopenic purpura: Secondary | ICD-10-CM | POA: Insufficient documentation

## 2017-07-04 DIAGNOSIS — T8459XD Infection and inflammatory reaction due to other internal joint prosthesis, subsequent encounter: Secondary | ICD-10-CM | POA: Diagnosis not present

## 2017-07-04 DIAGNOSIS — Z96649 Presence of unspecified artificial hip joint: Secondary | ICD-10-CM | POA: Diagnosis not present

## 2017-07-04 LAB — CBC
HEMATOCRIT: 36.9 % — AB (ref 38.5–50.0)
Hemoglobin: 12.5 g/dL — ABNORMAL LOW (ref 13.2–17.1)
MCH: 32.9 pg (ref 27.0–33.0)
MCHC: 33.9 g/dL (ref 32.0–36.0)
MCV: 97.1 fL (ref 80.0–100.0)
MPV: 9.7 fL (ref 7.5–12.5)
Platelets: 206 10*3/uL (ref 140–400)
RBC: 3.8 MIL/uL — ABNORMAL LOW (ref 4.20–5.80)
RDW: 14.7 % (ref 11.0–15.0)
WBC: 6.4 10*3/uL (ref 3.8–10.8)

## 2017-07-04 LAB — COMPREHENSIVE METABOLIC PANEL
ALK PHOS: 127 U/L — AB (ref 40–115)
ALT: 21 U/L (ref 9–46)
AST: 49 U/L — ABNORMAL HIGH (ref 10–35)
Albumin: 3.5 g/dL — ABNORMAL LOW (ref 3.6–5.1)
BUN: 5 mg/dL — ABNORMAL LOW (ref 7–25)
CALCIUM: 8.5 mg/dL — AB (ref 8.6–10.3)
CO2: 21 mmol/L (ref 20–31)
CREATININE: 0.74 mg/dL (ref 0.70–1.18)
Chloride: 101 mmol/L (ref 98–110)
Glucose, Bld: 79 mg/dL (ref 65–99)
POTASSIUM: 4.2 mmol/L (ref 3.5–5.3)
Sodium: 137 mmol/L (ref 135–146)
Total Bilirubin: 0.7 mg/dL (ref 0.2–1.2)
Total Protein: 6.4 g/dL (ref 6.1–8.1)

## 2017-07-04 LAB — SEDIMENTATION RATE: SED RATE: 34 mm/h — AB (ref 0–20)

## 2017-07-04 NOTE — Progress Notes (Signed)
   Subjective:    Patient ID: Jesus Daniel, male    DOB: March 19, 1946, 71 y.o.   MRN: [id number]  HPI 71 y.o. 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 Percell Miller. Afterwards his wound broke down during PT, this later healed.  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 He was treated with vanco and rifampin, d/c to SNF On 4-28. He did not have surgery.  He completed his vanco on 05-17-16. He was then changed to po doxy which was stopped on 6-20 (I was not aware of this). He was seen on 06-24-16 and was restarted on doxy with a plan for 1 year.   4-25     12-13-16 ESR90         47 CRP8.4       3.5  States he has been taking his doxy. He was seen by Ortho in [place] who told him "he has 50:50 chance of seeing his next birthday". He had no blood work, only plain radiographs. He was refered by Dr Doyce Para.  He is to have repeat surgery on his R hip Oct 08, 2017.  He has minor pain in his hip sitting in a chair. Can walk on using a walker. Had ramp built at his house, can negotiate with 1 hand.   Review of Systems  Constitutional: Positive for appetite change. Negative for chills, fever and unexpected weight change.  Gastrointestinal: Negative for constipation and diarrhea.  Genitourinary: Negative for difficulty urinating.  Musculoskeletal: Positive for arthralgias.  Skin: Negative for rash.  had sun-burning at last visit to beach. Has had darkening of skin (cna be due to doxy).      Objective:   Physical Exam  Constitutional: He appears well-developed and well-nourished.  HENT:  Mouth/Throat: No oropharyngeal exudate.  Eyes: Pupils are equal, round, and reactive to light. EOM are normal.  Neck: Neck supple.  Cardiovascular: Normal rate, regular rhythm and normal heart sounds.   Pulmonary/Chest: Effort normal and breath sounds  normal.  Abdominal: Soft. Bowel sounds are normal. There is no tenderness. There is no rebound.  Musculoskeletal: He exhibits edema.  No pain in R hip with rotation although his participation is  very limited.   Lymphadenopathy:    He has no cervical adenopathy.  hyperpigmentation of skin.      Assessment & Plan:

## 2017-07-04 NOTE — Assessment & Plan Note (Signed)
He is prepping for a 2 stage procedure in [place].  Will keep him on doxy til he has surgery.  Will check his ESR and CRP.  Will send notes to [facility] and see if we can get notes from them, somewhat limited today by lack of information.  Will see him back after surgery.

## 2017-07-04 NOTE — Assessment & Plan Note (Signed)
Will have followed by PCP.  Some overlap with skin darkening from doxy.

## 2017-07-07 LAB — C-REACTIVE PROTEIN: CRP: 1.9 mg/L (ref <8.0)

## 2017-07-14 ENCOUNTER — Telehealth: Payer: Self-pay | Admitting: *Deleted

## 2017-07-14 NOTE — Telephone Encounter (Signed)
Patient called for the results of his labs; please advise. He also wanted to know if the surgeon had contacted Dr. Johnnye Sima. Myrtis Hopping

## 2017-07-15 NOTE — Telephone Encounter (Signed)
His ESR and CRP  Are better but not normal yet I have not seen a note from surgery yet thanks

## 2017-07-17 NOTE — Telephone Encounter (Signed)
Patient notified

## 2017-08-25 DIAGNOSIS — R030 Elevated blood-pressure reading, without diagnosis of hypertension: Secondary | ICD-10-CM | POA: Diagnosis not present

## 2017-08-25 DIAGNOSIS — R945 Abnormal results of liver function studies: Secondary | ICD-10-CM | POA: Diagnosis not present

## 2017-08-25 DIAGNOSIS — Z970 Presence of artificial eye: Secondary | ICD-10-CM | POA: Diagnosis not present

## 2017-08-25 DIAGNOSIS — F1020 Alcohol dependence, uncomplicated: Secondary | ICD-10-CM | POA: Diagnosis not present

## 2017-08-25 DIAGNOSIS — Z0000 Encounter for general adult medical examination without abnormal findings: Secondary | ICD-10-CM | POA: Diagnosis not present

## 2017-09-10 DIAGNOSIS — H2512 Age-related nuclear cataract, left eye: Secondary | ICD-10-CM | POA: Diagnosis not present

## 2017-09-10 DIAGNOSIS — H31092 Other chorioretinal scars, left eye: Secondary | ICD-10-CM | POA: Diagnosis not present

## 2017-09-10 DIAGNOSIS — H40052 Ocular hypertension, left eye: Secondary | ICD-10-CM | POA: Diagnosis not present

## 2017-09-10 DIAGNOSIS — Z970 Presence of artificial eye: Secondary | ICD-10-CM | POA: Diagnosis not present

## 2017-09-23 ENCOUNTER — Telehealth: Payer: Self-pay | Admitting: *Deleted

## 2017-09-23 NOTE — Telephone Encounter (Signed)
Surgeon at [facility] is requesting last office visit notes, labs, and clearance from Dr Johnnye Sima for patient's upcoming surgery scheduled 10/31. Per Romie Minus at [facility], the patient has a documented history of liver failure and the surgeon does not feel he is a good candidate. Patient denies this history or diagnosis. Surgeon is requesting more information from patient's physicians. Landis Gandy, RN

## 2017-09-24 NOTE — Telephone Encounter (Signed)
Please forward notes.

## 2017-09-24 NOTE — Telephone Encounter (Signed)
Faxed notes and lab as requested by Sharyn Lull.

## 2017-10-01 DIAGNOSIS — F1020 Alcohol dependence, uncomplicated: Secondary | ICD-10-CM | POA: Diagnosis not present

## 2017-10-01 DIAGNOSIS — T8459XD Infection and inflammatory reaction due to other internal joint prosthesis, subsequent encounter: Secondary | ICD-10-CM | POA: Diagnosis not present

## 2017-10-01 DIAGNOSIS — S51801A Unspecified open wound of right forearm, initial encounter: Secondary | ICD-10-CM | POA: Diagnosis not present

## 2017-10-01 DIAGNOSIS — E785 Hyperlipidemia, unspecified: Secondary | ICD-10-CM | POA: Diagnosis not present

## 2017-10-01 DIAGNOSIS — Z96649 Presence of unspecified artificial hip joint: Secondary | ICD-10-CM | POA: Diagnosis not present

## 2017-10-01 DIAGNOSIS — R945 Abnormal results of liver function studies: Secondary | ICD-10-CM | POA: Diagnosis not present

## 2017-10-10 DIAGNOSIS — M25551 Pain in right hip: Secondary | ICD-10-CM | POA: Diagnosis not present

## 2017-10-10 DIAGNOSIS — M545 Low back pain: Secondary | ICD-10-CM | POA: Diagnosis not present

## 2017-10-20 DIAGNOSIS — M25551 Pain in right hip: Secondary | ICD-10-CM | POA: Diagnosis not present

## 2017-10-22 DIAGNOSIS — D649 Anemia, unspecified: Secondary | ICD-10-CM | POA: Diagnosis not present

## 2017-11-04 ENCOUNTER — Ambulatory Visit (INDEPENDENT_AMBULATORY_CARE_PROVIDER_SITE_OTHER): Payer: PPO | Admitting: Infectious Diseases

## 2017-11-04 ENCOUNTER — Encounter: Payer: Self-pay | Admitting: Infectious Diseases

## 2017-11-04 VITALS — BP 141/78 | HR 108 | Temp 97.9°F | Wt 228.0 lb

## 2017-11-04 DIAGNOSIS — A4901 Methicillin susceptible Staphylococcus aureus infection, unspecified site: Secondary | ICD-10-CM

## 2017-11-04 DIAGNOSIS — T8459XD Infection and inflammatory reaction due to other internal joint prosthesis, subsequent encounter: Secondary | ICD-10-CM | POA: Diagnosis not present

## 2017-11-04 DIAGNOSIS — F1020 Alcohol dependence, uncomplicated: Secondary | ICD-10-CM

## 2017-11-04 DIAGNOSIS — I10 Essential (primary) hypertension: Secondary | ICD-10-CM

## 2017-11-04 DIAGNOSIS — Z96649 Presence of unspecified artificial hip joint: Secondary | ICD-10-CM

## 2017-11-04 MED ORDER — DOXYCYCLINE HYCLATE 100 MG PO TABS: ORAL_TABLET | ORAL | 2 refills | Status: DC

## 2017-11-04 NOTE — Assessment & Plan Note (Signed)
Will check his ESR and CRP today.  Will continue, refill doxy til he has surgery.  Hopefully repeat Cx done in OR.  Will see him back in 4 months.

## 2017-11-04 NOTE — Progress Notes (Signed)
   Subjective:    Patient ID: Jesus Daniel, male    DOB: [birth year], 71 y.o.   MRN: [id number]  HPI 71 y.o. 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 Percell Miller. Afterwards his wound broke down during PT, this later healed.  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 He was treated with vanco and rifampin, d/c to SNF On 4-28. He did not have surgery.  He completed his vanco on 05-17-16. He was then changed to po doxy which was stopped on 6-20 (I was not aware of this). He was seen on 06-24-16 and was restarted on doxy with a plan for 1 year.  He had nuclear bone scan on 02-2017 There is increased blood pool and delayed phase uptake surrounding the proximal portion of the right hip arthroplasty device including the acetabular component. The appearance is unchanged from bone scan dated 11/27/2016. He had f/u in ID 06-2017 and his plan was changed to have him continue on doxy til he has repeat hip surgery.   4-251-5-18  07-04-17 [id number]  34 CRP8.43.5  1.9   He is to have repeat surgery on his R hip- he was being eval at [facility] and then was deferred to eval at [facility] (Jan 9).   He had MRI at his orthopedist office. Told he had degeneration between his muscles, hip and bone.  He still has pain with walking on his hip. Can do ADLs without assistance. His sons are local, gets some help with getting trash and mail out. He continues to take oxycodone for pain.  States that he drinks 6 beers/day due to not having enough oxycodone. "I don't drink hard stuff".  Has tried to cut back but has been hurting too bad.   Last month he was eval by PCP for iron deficiency anemia and a fall . Denies missed anbx  Review of Systems  Constitutional: Negative for appetite change, chills, fever and unexpected weight change.    Gastrointestinal: Negative for constipation and diarrhea.  Genitourinary: Negative for difficulty urinating.  Musculoskeletal: Positive for arthralgias and gait problem.  Psychiatric/Behavioral: Positive for sleep disturbance.  early AM waking.  Please see HPI. All other systems reviewed and negative.     Objective:   Physical Exam  Constitutional: He appears well-developed and well-nourished.  HENT:  Mouth/Throat: No oropharyngeal exudate.  Eyes: EOM are normal. No scleral icterus.  Neck: Neck supple.  Cardiovascular: Regular rhythm and normal heart sounds. Tachycardia present.  Pulmonary/Chest: Effort normal and breath sounds normal.  Abdominal: Soft. Bowel sounds are normal. There is no tenderness. There is no rebound.  Musculoskeletal: He exhibits edema.       Legs: Lymphadenopathy:    He has no cervical adenopathy.      Assessment & Plan:

## 2017-11-04 NOTE — Assessment & Plan Note (Signed)
He has not taken his lasix yet today.  I encouraged him to do so and to keep his legs elevated.

## 2017-11-04 NOTE — Assessment & Plan Note (Signed)
Encouraged him to cut back.  He does not attribute his fall to this.

## 2017-11-05 LAB — CBC
HEMATOCRIT: 32.2 % — AB (ref 38.5–50.0)
Hemoglobin: 10.8 g/dL — ABNORMAL LOW (ref 13.2–17.1)
MCH: 30.9 pg (ref 27.0–33.0)
MCHC: 33.5 g/dL (ref 32.0–36.0)
MCV: 92.3 fL (ref 80.0–100.0)
MPV: 9.8 fL (ref 7.5–12.5)
PLATELETS: 200 10*3/uL (ref 140–400)
RBC: 3.49 10*6/uL — ABNORMAL LOW (ref 4.20–5.80)
RDW: 13.5 % (ref 11.0–15.0)
WBC: 5.4 10*3/uL (ref 3.8–10.8)

## 2017-11-05 LAB — COMPREHENSIVE METABOLIC PANEL
AG Ratio: 1.1 (calc) (ref 1.0–2.5)
ALBUMIN MSPROF: 3.5 g/dL — AB (ref 3.6–5.1)
ALT: 14 U/L (ref 9–46)
AST: 36 U/L — AB (ref 10–35)
Alkaline phosphatase (APISO): 118 U/L — ABNORMAL HIGH (ref 40–115)
BILIRUBIN TOTAL: 0.6 mg/dL (ref 0.2–1.2)
BUN/Creatinine Ratio: 7 (calc) (ref 6–22)
BUN: 5 mg/dL — AB (ref 7–25)
CALCIUM: 8.8 mg/dL (ref 8.6–10.3)
CHLORIDE: 98 mmol/L (ref 98–110)
CO2: 24 mmol/L (ref 20–32)
Creat: 0.74 mg/dL (ref 0.70–1.18)
GLOBULIN: 3.1 g/dL (ref 1.9–3.7)
Glucose, Bld: 90 mg/dL (ref 65–99)
POTASSIUM: 4.6 mmol/L (ref 3.5–5.3)
Sodium: 135 mmol/L (ref 135–146)
Total Protein: 6.6 g/dL (ref 6.1–8.1)

## 2017-11-05 LAB — C-REACTIVE PROTEIN: CRP: 9.9 mg/L — ABNORMAL HIGH (ref <8.0)

## 2017-11-05 LAB — SEDIMENTATION RATE: Sed Rate: 75 mm/h — ABNORMAL HIGH (ref 0–20)

## 2017-12-17 DIAGNOSIS — Z96641 Presence of right artificial hip joint: Secondary | ICD-10-CM | POA: Diagnosis not present

## 2017-12-26 DIAGNOSIS — M25561 Pain in right knee: Secondary | ICD-10-CM | POA: Diagnosis not present

## 2017-12-26 DIAGNOSIS — M25562 Pain in left knee: Secondary | ICD-10-CM | POA: Diagnosis not present

## 2018-01-27 ENCOUNTER — Other Ambulatory Visit: Payer: Self-pay | Admitting: Behavioral Health

## 2018-01-27 DIAGNOSIS — A4901 Methicillin susceptible Staphylococcus aureus infection, unspecified site: Secondary | ICD-10-CM

## 2018-01-27 DIAGNOSIS — Z96649 Presence of unspecified artificial hip joint: Secondary | ICD-10-CM

## 2018-01-27 DIAGNOSIS — T8459XD Infection and inflammatory reaction due to other internal joint prosthesis, subsequent encounter: Secondary | ICD-10-CM

## 2018-01-27 MED ORDER — DOXYCYCLINE HYCLATE 100 MG PO TABS: ORAL_TABLET | ORAL | 2 refills | Status: DC

## 2018-03-09 ENCOUNTER — Ambulatory Visit (INDEPENDENT_AMBULATORY_CARE_PROVIDER_SITE_OTHER): Payer: PPO | Admitting: Infectious Diseases

## 2018-03-09 ENCOUNTER — Encounter: Payer: Self-pay | Admitting: Infectious Diseases

## 2018-03-09 VITALS — BP 128/87 | HR 103 | Temp 97.9°F | Ht 70.0 in

## 2018-03-09 DIAGNOSIS — T8459XD Infection and inflammatory reaction due to other internal joint prosthesis, subsequent encounter: Secondary | ICD-10-CM | POA: Diagnosis not present

## 2018-03-09 DIAGNOSIS — W19XXXD Unspecified fall, subsequent encounter: Secondary | ICD-10-CM

## 2018-03-09 DIAGNOSIS — F1020 Alcohol dependence, uncomplicated: Secondary | ICD-10-CM | POA: Diagnosis not present

## 2018-03-09 DIAGNOSIS — Z96649 Presence of unspecified artificial hip joint: Secondary | ICD-10-CM | POA: Diagnosis not present

## 2018-03-09 NOTE — Assessment & Plan Note (Signed)
Will keep him on doxy I would consider another opinion from [facility] or [place]? He will stay on anbx indefinitely.  Will see him back 4 months.

## 2018-03-09 NOTE — Progress Notes (Signed)
   Subjective:    Patient ID: Jesus Daniel, male    DOB: 08-16-1946, 72 y.o.   MRN: [id number]  HPI 72y.o. 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 Percell Miller. Afterwards his wound broke down during PT, this later healed.  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 He was treated with vanco and rifampin, d/c to SNF On 4-28. He did not have surgery.  He completed his vanco on 05-17-16. He was then changed to po doxy which was stopped on 6-20 (I was not aware of this). He was seen on 06-24-16 and was restarted on doxy with a plan for 1 year.  He had nuclear bone scan on 02-2017: There is increased blood pool and delayed phase uptake surrounding the proximal portion of the right hip arthroplasty device including the acetabular component. The appearance is unchanged from bone scan dated 11/27/2016.  He had f/u in ID 06-2017 and his plan was changed to have him continue on doxy til he has repeat hip surgery.   4-251-5-18             07-04-17 11-04-17 [id number]                   34  75 CRP8.43.5                   1.9  9.9   He is to have repeat surgery on his R hip- he was being eval at [facility] and then was deferred to eval at [facility] prior to being seen.   At his eval at [facility] he was not felt to be a good surgical candidate. States that his infection in his hip has progressed into the socket and there is nothing to replace it into.  He had ID f/u 10-2017 and was continued on his doxy.  Is being followed at pain mgmt.  He gets around with walker, can "barely" accomplish ADLs (per wife). Has pain with wt bearing.    Review of Systems  Constitutional: Negative for appetite change, chills, fever and unexpected weight change.  Cardiovascular: Positive for leg swelling.  Gastrointestinal: Negative for constipation and  diarrhea.  Genitourinary: Negative for difficulty urinating.  Musculoskeletal: Positive for arthralgias.       Objective:   Physical Exam  Constitutional: He appears well-developed and well-nourished.  HENT:  Mouth/Throat: No oropharyngeal exudate.  Eyes: EOM are normal.  Neck: Neck supple.  Cardiovascular: Normal rate, regular rhythm and normal heart sounds.  Pulmonary/Chest: Effort normal and breath sounds normal.  Abdominal: Soft. Bowel sounds are normal. There is no tenderness. There is no rebound.  Musculoskeletal: He exhibits edema.  Discoloration of LE, venous insufficiency.  Bronzing of UE  Lymphadenopathy:    He has no cervical adenopathy.          Assessment & Plan:

## 2018-03-09 NOTE — Assessment & Plan Note (Signed)
Does not have home health.  Will have them eval.

## 2018-03-09 NOTE — Assessment & Plan Note (Signed)
He continues to drink several beers/day.

## 2018-03-10 LAB — CBC
HEMATOCRIT: 33.1 % — AB (ref 38.5–50.0)
HEMOGLOBIN: 11.6 g/dL — AB (ref 13.2–17.1)
MCH: 32.5 pg (ref 27.0–33.0)
MCHC: 35 g/dL (ref 32.0–36.0)
MCV: 92.7 fL (ref 80.0–100.0)
MPV: 10 fL (ref 7.5–12.5)
Platelets: 162 10*3/uL (ref 140–400)
RBC: 3.57 10*6/uL — ABNORMAL LOW (ref 4.20–5.80)
RDW: 14 % (ref 11.0–15.0)
WBC: 4.9 10*3/uL (ref 3.8–10.8)

## 2018-03-10 LAB — COMPREHENSIVE METABOLIC PANEL
AG RATIO: 1.1 (calc) (ref 1.0–2.5)
ALT: 12 U/L (ref 9–46)
AST: 38 U/L — AB (ref 10–35)
Albumin: 3.3 g/dL — ABNORMAL LOW (ref 3.6–5.1)
Alkaline phosphatase (APISO): 144 U/L — ABNORMAL HIGH (ref 40–115)
BUN / CREAT RATIO: 5 (calc) — AB (ref 6–22)
BUN: 4 mg/dL — AB (ref 7–25)
CO2: 25 mmol/L (ref 20–32)
CREATININE: 0.77 mg/dL (ref 0.70–1.18)
Calcium: 8.7 mg/dL (ref 8.6–10.3)
Chloride: 96 mmol/L — ABNORMAL LOW (ref 98–110)
GLUCOSE: 83 mg/dL (ref 65–99)
Globulin: 3.1 g/dL (calc) (ref 1.9–3.7)
Potassium: 4.6 mmol/L (ref 3.5–5.3)
SODIUM: 132 mmol/L — AB (ref 135–146)
TOTAL PROTEIN: 6.4 g/dL (ref 6.1–8.1)
Total Bilirubin: 0.8 mg/dL (ref 0.2–1.2)

## 2018-03-10 LAB — C-REACTIVE PROTEIN: CRP: 5.1 mg/L (ref <8.0)

## 2018-03-10 LAB — SEDIMENTATION RATE: SED RATE: 62 mm/h — AB (ref 0–20)

## 2018-03-17 DIAGNOSIS — M47816 Spondylosis without myelopathy or radiculopathy, lumbar region: Secondary | ICD-10-CM | POA: Diagnosis not present

## 2018-03-17 DIAGNOSIS — M150 Primary generalized (osteo)arthritis: Secondary | ICD-10-CM | POA: Diagnosis not present

## 2018-03-17 DIAGNOSIS — Z79891 Long term (current) use of opiate analgesic: Secondary | ICD-10-CM | POA: Diagnosis not present

## 2018-03-17 DIAGNOSIS — G894 Chronic pain syndrome: Secondary | ICD-10-CM | POA: Diagnosis not present

## 2018-03-17 DIAGNOSIS — M25551 Pain in right hip: Secondary | ICD-10-CM | POA: Diagnosis not present

## 2018-04-16 DIAGNOSIS — M25551 Pain in right hip: Secondary | ICD-10-CM | POA: Diagnosis not present

## 2018-04-16 DIAGNOSIS — M47816 Spondylosis without myelopathy or radiculopathy, lumbar region: Secondary | ICD-10-CM | POA: Diagnosis not present

## 2018-04-16 DIAGNOSIS — M150 Primary generalized (osteo)arthritis: Secondary | ICD-10-CM | POA: Diagnosis not present

## 2018-04-16 DIAGNOSIS — G894 Chronic pain syndrome: Secondary | ICD-10-CM | POA: Diagnosis not present

## 2018-05-14 DIAGNOSIS — G894 Chronic pain syndrome: Secondary | ICD-10-CM | POA: Diagnosis not present

## 2018-05-14 DIAGNOSIS — M25551 Pain in right hip: Secondary | ICD-10-CM | POA: Diagnosis not present

## 2018-05-14 DIAGNOSIS — M150 Primary generalized (osteo)arthritis: Secondary | ICD-10-CM | POA: Diagnosis not present

## 2018-05-14 DIAGNOSIS — M47816 Spondylosis without myelopathy or radiculopathy, lumbar region: Secondary | ICD-10-CM | POA: Diagnosis not present

## 2018-06-15 DIAGNOSIS — M25551 Pain in right hip: Secondary | ICD-10-CM | POA: Diagnosis not present

## 2018-06-15 DIAGNOSIS — M47816 Spondylosis without myelopathy or radiculopathy, lumbar region: Secondary | ICD-10-CM | POA: Diagnosis not present

## 2018-06-15 DIAGNOSIS — G894 Chronic pain syndrome: Secondary | ICD-10-CM | POA: Diagnosis not present

## 2018-06-15 DIAGNOSIS — M150 Primary generalized (osteo)arthritis: Secondary | ICD-10-CM | POA: Diagnosis not present

## 2018-07-07 ENCOUNTER — Other Ambulatory Visit: Payer: Self-pay | Admitting: *Deleted

## 2018-07-07 NOTE — Addendum Note (Signed)
Addended by: Deirdre Peer on: 07/07/2018 02:57 PM   Modules accepted: Orders

## 2018-07-07 NOTE — Patient Outreach (Signed)
[place] [facility]) Care Management  07/07/2018  Jesus Daniel 1946-09-15 [id number]   CSW received consult referral from [place] office indicating pt needed help at home and that pt wanted a Hospice referral.  CSW made initial contact with pt by phone and confirmed pt identity. CSW introduced self, role and reason for call. Pt reports he lives alone and has 2 sons that live nearby and are HCPOA's.  Per pt, he sees Dr. Johnnye Sima at Infectious Disease clinic (s/p THR with MRSA) and also goes to a Pain clinic. He is not able to provide the name of his PCP.  Pt [place] and that his sons assists with rides to appointments,etc as well as assisting him with getting "a real shower some days" as he sponge bathes at other times. He reports he is able to toilet, dress and prepare meals "as long as I don't stand long".  Pt denies any depression, SI, HI; stating he has occasions of feeling down but "keeps on moving". CSW inquired about the "patient wants Hospice" referral and he states, "I didn't know what I needed" but would voices an interest in getting some help in the home. CSW will place a referral for [facility] RNCM to screen pt as well as refer to Daneen Schick, [place], [facility] Social Work, for [employer] and linking.  CSW also spoke with his son, "Gus", who agreed that his father does not hospice care but help in the home would be of benefit if eligible and available. CSW will ask [facility] RNCM and [facility] BSW to consult this CSW if needed.    Eduard Clos, MSW, [place] Worker  [place] [phone] .

## 2018-07-09 ENCOUNTER — Ambulatory Visit (INDEPENDENT_AMBULATORY_CARE_PROVIDER_SITE_OTHER): Payer: PPO | Admitting: Infectious Diseases

## 2018-07-09 ENCOUNTER — Encounter: Payer: Self-pay | Admitting: Infectious Diseases

## 2018-07-09 DIAGNOSIS — A4901 Methicillin susceptible Staphylococcus aureus infection, unspecified site: Secondary | ICD-10-CM

## 2018-07-09 DIAGNOSIS — T8459XD Infection and inflammatory reaction due to other internal joint prosthesis, subsequent encounter: Secondary | ICD-10-CM

## 2018-07-09 DIAGNOSIS — Z96649 Presence of unspecified artificial hip joint: Secondary | ICD-10-CM | POA: Diagnosis not present

## 2018-07-09 MED ORDER — DOXYCYCLINE HYCLATE 100 MG PO TABS: ORAL_TABLET | ORAL | 2 refills | Status: DC

## 2018-07-09 NOTE — Progress Notes (Signed)
   Subjective:    Patient ID: Jesus Daniel, male    DOB: 04/18/1946, 72 y.o.   MRN: [id number]  HPI 72 y.o. 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 Murphy. Afterwards his wound broke down during PT, this later healed.  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 He was treated with vanco and rifampin, d/c to SNF On 4-28. He did not have surgery.  He completed his vanco on 05-17-16. He was then changed to po doxy which was stopped on 6-20 (I was not aware of this). He was seen on 06-24-16 and was restarted on doxy with a plan for 1 year. He had nuclear bone scan on 02-2017: There is increased blood pool and delayed phase uptake surrounding the proximal portion of the right hip arthroplasty device including the acetabular component. The appearance is unchanged from bone scan dated 11/27/2016.  He had f/u in ID 06-2017 and his plan was changed to have him continue on doxy til he has repeat hip surgery.  04-02-17 1-5-187-27-18      11-04-17 ESR90  [id number]               75 CRP8.4  3.51.9              9.9  He was to have repeat surgery on his hip- he was eval in [place] and then [facility]. He was not felt to be a good candidate. States that his infection in his hip has progressed into the socket and there is nothing to replace it into.  "Dr Murphy has give up on me, Dr Alusio has give up on me, the guy in [place] has give up on me...""The guy at [facility] didn't even see me".   He had ID f/u 10-2017 and 03-09-18, was continued on his doxy indefinitely. He needs a refill on this.  He is having worsening pain in his R leg/thigh. He is using a walker, wobbly when he tried to stand for prolonged periods (cooking).    Is being followed at pain mgmt.   Review of Systems  Constitutional:  Negative for appetite change, chills, fever and unexpected weight change.  Musculoskeletal: Positive for myalgias.  Please see HPI. All other systems reviewed and negative.      Objective:   Physical Exam  Constitutional: He appears well-developed and well-nourished.  Musculoskeletal: He exhibits edema and tenderness.       Legs:         Assessment & Plan:   

## 2018-07-09 NOTE — Assessment & Plan Note (Addendum)
Will continue him on doxy indefinitely. I explained to him that we have very limited options. That checking his labs or MRI again would only be useful if he was going to have more surgery.  He is very frustrated and appears to have worsening of his pain.  I suggested referral to [facility] or [facility] for further eval. I am not sure what else to offer him at this point.  As well, I am unsure why he is deffered from surgery.  Would query if placement of a spacer might be reasonable? Will see him back in 9-12 months.

## 2018-07-10 ENCOUNTER — Other Ambulatory Visit: Payer: Self-pay

## 2018-07-10 NOTE — Patient Outreach (Signed)
[place]) Care Management  07/10/2018  Jesus Daniel 03-Mar-1946 [id number]  Successful outreach to the patient on today's date, HIPAA identifiers confirmed. BSW introduced self to the patient. BSW discussed [facility] Care Management services with the patient and discussed the recent referral for an in home caregiver. The patient stated "I'd like someone to come out here and take a look at my home to see what I need for the house to be more livable". The patient discusses he has difficulty accessing his bathroom to perform ADL's. The patient is agreeable to a home visit. BSW has scheduled a home visit for Tuesday August 6 to further evaluate patients community resource needs.   Daneen Schick, BSW, CDP [facility] [phone]

## 2018-07-13 ENCOUNTER — Emergency Department (HOSPITAL_COMMUNITY): Payer: PPO

## 2018-07-13 ENCOUNTER — Encounter (HOSPITAL_COMMUNITY): Payer: Self-pay

## 2018-07-13 ENCOUNTER — Other Ambulatory Visit: Payer: Self-pay

## 2018-07-13 ENCOUNTER — Inpatient Hospital Stay (HOSPITAL_COMMUNITY)
Admission: EM | Admit: 2018-07-13 | Discharge: 2018-07-17 | DRG: 897 | Disposition: A | Discharge status: Home / Self Care | Payer: PPO | Attending: Family Medicine | Admitting: Family Medicine

## 2018-07-13 DIAGNOSIS — R279 Unspecified lack of coordination: Secondary | ICD-10-CM | POA: Diagnosis not present

## 2018-07-13 DIAGNOSIS — N179 Acute kidney failure, unspecified: Secondary | ICD-10-CM | POA: Diagnosis present

## 2018-07-13 DIAGNOSIS — F419 Anxiety disorder, unspecified: Secondary | ICD-10-CM | POA: Diagnosis present

## 2018-07-13 DIAGNOSIS — Z8614 Personal history of Methicillin resistant Staphylococcus aureus infection: Secondary | ICD-10-CM

## 2018-07-13 DIAGNOSIS — W1830XA Fall on same level, unspecified, initial encounter: Secondary | ICD-10-CM | POA: Diagnosis present

## 2018-07-13 DIAGNOSIS — E785 Hyperlipidemia, unspecified: Secondary | ICD-10-CM | POA: Diagnosis present

## 2018-07-13 DIAGNOSIS — R296 Repeated falls: Secondary | ICD-10-CM | POA: Diagnosis present

## 2018-07-13 DIAGNOSIS — G43A0 Cyclical vomiting, not intractable: Secondary | ICD-10-CM | POA: Diagnosis not present

## 2018-07-13 DIAGNOSIS — N400 Enlarged prostate without lower urinary tract symptoms: Secondary | ICD-10-CM | POA: Diagnosis present

## 2018-07-13 DIAGNOSIS — I10 Essential (primary) hypertension: Secondary | ICD-10-CM | POA: Diagnosis present

## 2018-07-13 DIAGNOSIS — W19XXXA Unspecified fall, initial encounter: Secondary | ICD-10-CM | POA: Diagnosis present

## 2018-07-13 DIAGNOSIS — M62838 Other muscle spasm: Secondary | ICD-10-CM | POA: Diagnosis not present

## 2018-07-13 DIAGNOSIS — T148XXA Other injury of unspecified body region, initial encounter: Secondary | ICD-10-CM | POA: Diagnosis not present

## 2018-07-13 DIAGNOSIS — S80811A Abrasion, right lower leg, initial encounter: Secondary | ICD-10-CM | POA: Diagnosis present

## 2018-07-13 DIAGNOSIS — F17220 Nicotine dependence, chewing tobacco, uncomplicated: Secondary | ICD-10-CM | POA: Diagnosis present

## 2018-07-13 DIAGNOSIS — Z23 Encounter for immunization: Secondary | ICD-10-CM | POA: Diagnosis present

## 2018-07-13 DIAGNOSIS — K5900 Constipation, unspecified: Secondary | ICD-10-CM | POA: Diagnosis not present

## 2018-07-13 DIAGNOSIS — R112 Nausea with vomiting, unspecified: Secondary | ICD-10-CM | POA: Diagnosis present

## 2018-07-13 DIAGNOSIS — H5461 Unqualified visual loss, right eye, normal vision left eye: Secondary | ICD-10-CM | POA: Diagnosis present

## 2018-07-13 DIAGNOSIS — D696 Thrombocytopenia, unspecified: Secondary | ICD-10-CM | POA: Diagnosis present

## 2018-07-13 DIAGNOSIS — Z79891 Long term (current) use of opiate analgesic: Secondary | ICD-10-CM

## 2018-07-13 DIAGNOSIS — S199XXA Unspecified injury of neck, initial encounter: Secondary | ICD-10-CM | POA: Diagnosis not present

## 2018-07-13 DIAGNOSIS — S80812A Abrasion, left lower leg, initial encounter: Secondary | ICD-10-CM | POA: Diagnosis present

## 2018-07-13 DIAGNOSIS — F329 Major depressive disorder, single episode, unspecified: Secondary | ICD-10-CM | POA: Diagnosis present

## 2018-07-13 DIAGNOSIS — K219 Gastro-esophageal reflux disease without esophagitis: Secondary | ICD-10-CM | POA: Diagnosis present

## 2018-07-13 DIAGNOSIS — E860 Dehydration: Secondary | ICD-10-CM

## 2018-07-13 DIAGNOSIS — F1010 Alcohol abuse, uncomplicated: Principal | ICD-10-CM | POA: Diagnosis present

## 2018-07-13 DIAGNOSIS — J219 Acute bronchiolitis, unspecified: Secondary | ICD-10-CM | POA: Diagnosis not present

## 2018-07-13 DIAGNOSIS — F10980 Alcohol use, unspecified with alcohol-induced anxiety disorder: Secondary | ICD-10-CM | POA: Diagnosis not present

## 2018-07-13 DIAGNOSIS — D531 Other megaloblastic anemias, not elsewhere classified: Secondary | ICD-10-CM | POA: Diagnosis present

## 2018-07-13 DIAGNOSIS — Z111 Encounter for screening for respiratory tuberculosis: Secondary | ICD-10-CM | POA: Diagnosis not present

## 2018-07-13 DIAGNOSIS — S0990XA Unspecified injury of head, initial encounter: Secondary | ICD-10-CM | POA: Diagnosis not present

## 2018-07-13 DIAGNOSIS — S51811A Laceration without foreign body of right forearm, initial encounter: Secondary | ICD-10-CM | POA: Diagnosis not present

## 2018-07-13 DIAGNOSIS — M7989 Other specified soft tissue disorders: Secondary | ICD-10-CM | POA: Diagnosis not present

## 2018-07-13 DIAGNOSIS — K7460 Unspecified cirrhosis of liver: Secondary | ICD-10-CM | POA: Diagnosis present

## 2018-07-13 DIAGNOSIS — Z792 Long term (current) use of antibiotics: Secondary | ICD-10-CM | POA: Diagnosis not present

## 2018-07-13 DIAGNOSIS — E871 Hypo-osmolality and hyponatremia: Secondary | ICD-10-CM | POA: Diagnosis present

## 2018-07-13 DIAGNOSIS — M6281 Muscle weakness (generalized): Secondary | ICD-10-CM | POA: Diagnosis not present

## 2018-07-13 DIAGNOSIS — S81811A Laceration without foreign body, right lower leg, initial encounter: Secondary | ICD-10-CM | POA: Diagnosis not present

## 2018-07-13 DIAGNOSIS — Y92009 Unspecified place in unspecified non-institutional (private) residence as the place of occurrence of the external cause: Secondary | ICD-10-CM | POA: Diagnosis not present

## 2018-07-13 DIAGNOSIS — E876 Hypokalemia: Secondary | ICD-10-CM | POA: Diagnosis present

## 2018-07-13 DIAGNOSIS — D539 Nutritional anemia, unspecified: Secondary | ICD-10-CM | POA: Diagnosis not present

## 2018-07-13 DIAGNOSIS — Z96641 Presence of right artificial hip joint: Secondary | ICD-10-CM | POA: Diagnosis present

## 2018-07-13 DIAGNOSIS — S51812A Laceration without foreign body of left forearm, initial encounter: Secondary | ICD-10-CM | POA: Diagnosis not present

## 2018-07-13 DIAGNOSIS — Z9181 History of falling: Secondary | ICD-10-CM | POA: Diagnosis not present

## 2018-07-13 DIAGNOSIS — S81812A Laceration without foreign body, left lower leg, initial encounter: Secondary | ICD-10-CM | POA: Diagnosis not present

## 2018-07-13 DIAGNOSIS — Z85820 Personal history of malignant melanoma of skin: Secondary | ICD-10-CM

## 2018-07-13 DIAGNOSIS — R109 Unspecified abdominal pain: Secondary | ICD-10-CM | POA: Diagnosis not present

## 2018-07-13 DIAGNOSIS — S8992XA Unspecified injury of left lower leg, initial encounter: Secondary | ICD-10-CM | POA: Diagnosis not present

## 2018-07-13 DIAGNOSIS — D649 Anemia, unspecified: Secondary | ICD-10-CM | POA: Diagnosis not present

## 2018-07-13 DIAGNOSIS — R52 Pain, unspecified: Secondary | ICD-10-CM | POA: Diagnosis not present

## 2018-07-13 DIAGNOSIS — R58 Hemorrhage, not elsewhere classified: Secondary | ICD-10-CM | POA: Diagnosis not present

## 2018-07-13 DIAGNOSIS — Z79899 Other long term (current) drug therapy: Secondary | ICD-10-CM

## 2018-07-13 DIAGNOSIS — R262 Difficulty in walking, not elsewhere classified: Secondary | ICD-10-CM | POA: Diagnosis not present

## 2018-07-13 DIAGNOSIS — Z743 Need for continuous supervision: Secondary | ICD-10-CM | POA: Diagnosis not present

## 2018-07-13 DIAGNOSIS — T8451XA Infection and inflammatory reaction due to internal right hip prosthesis, initial encounter: Secondary | ICD-10-CM | POA: Diagnosis not present

## 2018-07-13 DIAGNOSIS — M109 Gout, unspecified: Secondary | ICD-10-CM | POA: Diagnosis not present

## 2018-07-13 LAB — CBC WITH DIFFERENTIAL/PLATELET
ABS IMMATURE GRANULOCYTES: 0 10*3/uL (ref 0.0–0.1)
BASOS ABS: 0 10*3/uL (ref 0.0–0.1)
Basophils Relative: 0 %
Eosinophils Absolute: 0 10*3/uL (ref 0.0–0.7)
Eosinophils Relative: 0 %
HEMATOCRIT: 30.8 % — AB (ref 39.0–52.0)
Hemoglobin: 10.3 g/dL — ABNORMAL LOW (ref 13.0–17.0)
IMMATURE GRANULOCYTES: 0 %
LYMPHS ABS: 1.2 10*3/uL (ref 0.7–4.0)
Lymphocytes Relative: 13 %
MCH: 34.4 pg — ABNORMAL HIGH (ref 26.0–34.0)
MCHC: 33.4 g/dL (ref 30.0–36.0)
MCV: 103 fL — ABNORMAL HIGH (ref 78.0–100.0)
Monocytes Absolute: 0.9 10*3/uL (ref 0.1–1.0)
Monocytes Relative: 10 %
NEUTROS ABS: 6.8 10*3/uL (ref 1.7–7.7)
Neutrophils Relative %: 77 %
Platelets: 146 10*3/uL — ABNORMAL LOW (ref 150–400)
RBC: 2.99 MIL/uL — AB (ref 4.22–5.81)
RDW: 12.9 % (ref 11.5–15.5)
WBC: 8.9 10*3/uL (ref 4.0–10.5)

## 2018-07-13 LAB — COMPREHENSIVE METABOLIC PANEL
ALT: 22 U/L (ref 0–44)
AST: 42 U/L — AB (ref 15–41)
Albumin: 2.4 g/dL — ABNORMAL LOW (ref 3.5–5.0)
Alkaline Phosphatase: 114 U/L (ref 38–126)
Anion gap: 17 — ABNORMAL HIGH (ref 5–15)
BUN: 20 mg/dL (ref 8–23)
CHLORIDE: 88 mmol/L — AB (ref 98–111)
CO2: 22 mmol/L (ref 22–32)
Calcium: 8 mg/dL — ABNORMAL LOW (ref 8.9–10.3)
Creatinine, Ser: 1.63 mg/dL — ABNORMAL HIGH (ref 0.61–1.24)
GFR, EST AFRICAN AMERICAN: 47 mL/min — AB (ref >60)
GFR, EST NON AFRICAN AMERICAN: 41 mL/min — AB (ref >60)
Glucose, Bld: 97 mg/dL (ref 70–99)
POTASSIUM: 2.5 mmol/L — AB (ref 3.5–5.1)
SODIUM: 127 mmol/L — AB (ref 135–145)
Total Bilirubin: 1.3 mg/dL — ABNORMAL HIGH (ref 0.3–1.2)
Total Protein: 5.4 g/dL — ABNORMAL LOW (ref 6.5–8.1)

## 2018-07-13 LAB — LIPASE, BLOOD: LIPASE: 37 U/L (ref 11–51)

## 2018-07-13 LAB — ETHANOL: Alcohol, Ethyl (B): <10 mg/dL (ref <10)

## 2018-07-13 LAB — MAGNESIUM: MAGNESIUM: 1.3 mg/dL — AB (ref 1.7–2.4)

## 2018-07-13 MED ORDER — ONDANSETRON HCL 4 MG/2ML IJ SOLN
4.0000 mg | Freq: Once | INTRAMUSCULAR | Status: AC
  Administered 2018-07-13: 4 mg via INTRAVENOUS
  Filled 2018-07-13: qty 2

## 2018-07-13 MED ORDER — THIAMINE HCL 100 MG/ML IJ SOLN: 100.0000 mg | Freq: Every day | INTRAMUSCULAR | Status: DC

## 2018-07-13 MED ORDER — MAGNESIUM SULFATE 2 GM/50ML IV SOLN: 2.0000 g | Freq: Once | INTRAVENOUS | Status: DC

## 2018-07-13 MED ORDER — POTASSIUM CHLORIDE 10 MEQ/100ML IV SOLN
10.0000 meq | INTRAVENOUS | Status: AC
  Administered 2018-07-13 – 2018-07-14 (×3): 10 meq via INTRAVENOUS
  Filled 2018-07-13 (×3): qty 100

## 2018-07-13 MED ORDER — SODIUM CHLORIDE 0.9 % IV BOLUS
1500.0000 mL | Freq: Once | INTRAVENOUS | Status: AC
  Administered 2018-07-13: 1500 mL via INTRAVENOUS

## 2018-07-13 MED ORDER — LORAZEPAM 1 MG PO TABS: 0.0000 mg | ORAL_TABLET | Freq: Four times a day (QID) | ORAL | Status: AC

## 2018-07-13 MED ORDER — OXYCODONE-ACETAMINOPHEN 5-325 MG PO TABS
1.0000 | ORAL_TABLET | Freq: Once | ORAL | Status: AC
  Administered 2018-07-13: 1 via ORAL
  Filled 2018-07-13: qty 1

## 2018-07-13 MED ORDER — VITAMIN B-1 100 MG PO TABS
100.0000 mg | ORAL_TABLET | Freq: Every day | ORAL | Status: DC
  Administered 2018-07-14 – 2018-07-17 (×4): 100 mg via ORAL
  Filled 2018-07-13 (×4): qty 1

## 2018-07-13 MED ORDER — METOCLOPRAMIDE HCL 5 MG/ML IJ SOLN
10.0000 mg | Freq: Once | INTRAMUSCULAR | Status: AC
  Administered 2018-07-13: 10 mg via INTRAVENOUS
  Filled 2018-07-13: qty 2

## 2018-07-13 MED ORDER — LORAZEPAM 2 MG/ML IJ SOLN: 0.0000 mg | Freq: Four times a day (QID) | INTRAMUSCULAR | Status: AC

## 2018-07-13 MED ORDER — TETANUS-DIPHTH-ACELL PERTUSSIS 5-2.5-18.5 LF-MCG/0.5 IM SUSP
0.5000 mL | Freq: Once | INTRAMUSCULAR | Status: AC
Start: 2018-07-13 — End: 2018-07-14
  Administered 2018-07-14: 0.5 mL via INTRAMUSCULAR
  Filled 2018-07-13: qty 0.5

## 2018-07-13 MED ORDER — LORAZEPAM 1 MG PO TABS: 0.0000 mg | ORAL_TABLET | Freq: Two times a day (BID) | ORAL | Status: DC

## 2018-07-13 MED ORDER — LORAZEPAM 2 MG/ML IJ SOLN: 0.0000 mg | Freq: Two times a day (BID) | INTRAMUSCULAR | Status: DC

## 2018-07-13 NOTE — ED Triage Notes (Signed)
Pt coming from home CC of falls. Pt had fall at 1130 and 1400 today. Pt went to urgent care where they dressed up abrasions and wounds. Pt stated wounds continued bleeding and called EMS. Denies use of blood thinners. Hx of MRSA and failed hip replacement.
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
## 2018-07-13 NOTE — ED Notes (Signed)
Patient transported to X-ray 

## 2018-07-14 ENCOUNTER — Encounter (HOSPITAL_COMMUNITY): Payer: Self-pay

## 2018-07-14 ENCOUNTER — Ambulatory Visit: Payer: Self-pay

## 2018-07-14 ENCOUNTER — Other Ambulatory Visit: Payer: Self-pay

## 2018-07-14 ENCOUNTER — Observation Stay (HOSPITAL_COMMUNITY): Payer: PPO

## 2018-07-14 DIAGNOSIS — F1010 Alcohol abuse, uncomplicated: Principal | ICD-10-CM

## 2018-07-14 DIAGNOSIS — R112 Nausea with vomiting, unspecified: Secondary | ICD-10-CM | POA: Diagnosis present

## 2018-07-14 DIAGNOSIS — S80811A Abrasion, right lower leg, initial encounter: Secondary | ICD-10-CM | POA: Diagnosis present

## 2018-07-14 DIAGNOSIS — E860 Dehydration: Secondary | ICD-10-CM

## 2018-07-14 DIAGNOSIS — W19XXXA Unspecified fall, initial encounter: Secondary | ICD-10-CM

## 2018-07-14 DIAGNOSIS — D696 Thrombocytopenia, unspecified: Secondary | ICD-10-CM | POA: Diagnosis present

## 2018-07-14 DIAGNOSIS — E876 Hypokalemia: Secondary | ICD-10-CM | POA: Diagnosis present

## 2018-07-14 DIAGNOSIS — Z792 Long term (current) use of antibiotics: Secondary | ICD-10-CM | POA: Diagnosis not present

## 2018-07-14 DIAGNOSIS — K219 Gastro-esophageal reflux disease without esophagitis: Secondary | ICD-10-CM

## 2018-07-14 DIAGNOSIS — W1830XA Fall on same level, unspecified, initial encounter: Secondary | ICD-10-CM | POA: Diagnosis present

## 2018-07-14 DIAGNOSIS — S80812A Abrasion, left lower leg, initial encounter: Secondary | ICD-10-CM | POA: Diagnosis present

## 2018-07-14 DIAGNOSIS — R296 Repeated falls: Secondary | ICD-10-CM | POA: Diagnosis present

## 2018-07-14 DIAGNOSIS — E871 Hypo-osmolality and hyponatremia: Secondary | ICD-10-CM | POA: Diagnosis present

## 2018-07-14 DIAGNOSIS — N400 Enlarged prostate without lower urinary tract symptoms: Secondary | ICD-10-CM | POA: Insufficient documentation

## 2018-07-14 DIAGNOSIS — T148XXA Other injury of unspecified body region, initial encounter: Secondary | ICD-10-CM

## 2018-07-14 DIAGNOSIS — D531 Other megaloblastic anemias, not elsewhere classified: Secondary | ICD-10-CM | POA: Diagnosis present

## 2018-07-14 DIAGNOSIS — R109 Unspecified abdominal pain: Secondary | ICD-10-CM | POA: Diagnosis not present

## 2018-07-14 DIAGNOSIS — F419 Anxiety disorder, unspecified: Secondary | ICD-10-CM | POA: Diagnosis present

## 2018-07-14 DIAGNOSIS — I10 Essential (primary) hypertension: Secondary | ICD-10-CM

## 2018-07-14 DIAGNOSIS — E785 Hyperlipidemia, unspecified: Secondary | ICD-10-CM | POA: Diagnosis present

## 2018-07-14 DIAGNOSIS — G43A0 Cyclical vomiting, not intractable: Secondary | ICD-10-CM

## 2018-07-14 DIAGNOSIS — F329 Major depressive disorder, single episode, unspecified: Secondary | ICD-10-CM | POA: Diagnosis present

## 2018-07-14 DIAGNOSIS — F17220 Nicotine dependence, chewing tobacco, uncomplicated: Secondary | ICD-10-CM | POA: Diagnosis present

## 2018-07-14 DIAGNOSIS — Y92009 Unspecified place in unspecified non-institutional (private) residence as the place of occurrence of the external cause: Secondary | ICD-10-CM | POA: Diagnosis not present

## 2018-07-14 DIAGNOSIS — Z8614 Personal history of Methicillin resistant Staphylococcus aureus infection: Secondary | ICD-10-CM | POA: Diagnosis not present

## 2018-07-14 DIAGNOSIS — H5461 Unqualified visual loss, right eye, normal vision left eye: Secondary | ICD-10-CM | POA: Diagnosis present

## 2018-07-14 DIAGNOSIS — N179 Acute kidney failure, unspecified: Secondary | ICD-10-CM

## 2018-07-14 DIAGNOSIS — Z96641 Presence of right artificial hip joint: Secondary | ICD-10-CM | POA: Diagnosis present

## 2018-07-14 DIAGNOSIS — Z23 Encounter for immunization: Secondary | ICD-10-CM | POA: Diagnosis present

## 2018-07-14 DIAGNOSIS — K7460 Unspecified cirrhosis of liver: Secondary | ICD-10-CM | POA: Diagnosis present

## 2018-07-14 LAB — MRSA PCR SCREENING: MRSA BY PCR: NEGATIVE

## 2018-07-14 LAB — BASIC METABOLIC PANEL
Anion gap: 12 (ref 5–15)
BUN: 19 mg/dL (ref 8–23)
CO2: 22 mmol/L (ref 22–32)
Calcium: 7.1 mg/dL — ABNORMAL LOW (ref 8.9–10.3)
Chloride: 95 mmol/L — ABNORMAL LOW (ref 98–111)
Creatinine, Ser: 1.49 mg/dL — ABNORMAL HIGH (ref 0.61–1.24)
GFR calc Af Amer: 53 mL/min — ABNORMAL LOW (ref >60)
GFR, EST NON AFRICAN AMERICAN: 45 mL/min — AB (ref >60)
Glucose, Bld: 97 mg/dL (ref 70–99)
Potassium: 3.7 mmol/L (ref 3.5–5.1)
SODIUM: 129 mmol/L — AB (ref 135–145)

## 2018-07-14 LAB — CBC
HEMATOCRIT: 22.9 % — AB (ref 39.0–52.0)
Hemoglobin: 8 g/dL — ABNORMAL LOW (ref 13.0–17.0)
MCH: 35.1 pg — ABNORMAL HIGH (ref 26.0–34.0)
MCHC: 34.9 g/dL (ref 30.0–36.0)
MCV: 100.4 fL — ABNORMAL HIGH (ref 78.0–100.0)
Platelets: 100 10*3/uL — ABNORMAL LOW (ref 150–400)
RBC: 2.28 MIL/uL — ABNORMAL LOW (ref 4.22–5.81)
RDW: 12.8 % (ref 11.5–15.5)
WBC: 6.1 10*3/uL (ref 4.0–10.5)

## 2018-07-14 MED ORDER — HYDRALAZINE HCL 20 MG/ML IJ SOLN: 5.0000 mg | INTRAMUSCULAR | Status: DC | PRN

## 2018-07-14 MED ORDER — IOPAMIDOL (ISOVUE-300) INJECTION 61%
INTRAVENOUS | Status: AC
  Filled 2018-07-14: qty 30

## 2018-07-14 MED ORDER — HYDROXYZINE HCL 10 MG PO TABS: 10.0000 mg | ORAL_TABLET | Freq: Four times a day (QID) | ORAL | Status: DC | PRN

## 2018-07-14 MED ORDER — OXYCODONE HCL 5 MG PO TABS
5.0000 mg | ORAL_TABLET | Freq: Four times a day (QID) | ORAL | Status: DC | PRN
  Administered 2018-07-14 – 2018-07-15 (×4): 5 mg via ORAL
  Filled 2018-07-14 (×4): qty 1

## 2018-07-14 MED ORDER — POTASSIUM CHLORIDE 20 MEQ/15ML (10%) PO SOLN
60.0000 meq | Freq: Once | ORAL | Status: AC
  Administered 2018-07-14: 60 meq via ORAL
  Filled 2018-07-14: qty 45

## 2018-07-14 MED ORDER — ACETAMINOPHEN 650 MG RE SUPP: 650.0000 mg | Freq: Four times a day (QID) | RECTAL | Status: DC | PRN

## 2018-07-14 MED ORDER — DOXYCYCLINE HYCLATE 100 MG PO TABS
100.0000 mg | ORAL_TABLET | Freq: Two times a day (BID) | ORAL | Status: DC
  Administered 2018-07-14 – 2018-07-17 (×6): 100 mg via ORAL
  Filled 2018-07-14 (×6): qty 1

## 2018-07-14 MED ORDER — ATORVASTATIN CALCIUM 10 MG PO TABS
10.0000 mg | ORAL_TABLET | Freq: Every day | ORAL | Status: DC
  Administered 2018-07-14 – 2018-07-17 (×4): 10 mg via ORAL
  Filled 2018-07-14 (×4): qty 1

## 2018-07-14 MED ORDER — PANTOPRAZOLE SODIUM 40 MG PO TBEC
40.0000 mg | DELAYED_RELEASE_TABLET | Freq: Every day | ORAL | Status: DC
  Administered 2018-07-14 – 2018-07-16 (×3): 40 mg via ORAL
  Filled 2018-07-14 (×4): qty 1

## 2018-07-14 MED ORDER — ZOLPIDEM TARTRATE 5 MG PO TABS: 5.0000 mg | ORAL_TABLET | Freq: Every evening | ORAL | Status: DC | PRN

## 2018-07-14 MED ORDER — POLYETHYLENE GLYCOL 3350 17 G PO PACK: 17.0000 g | PACK | Freq: Every day | ORAL | Status: DC | PRN

## 2018-07-14 MED ORDER — ACETAMINOPHEN 325 MG PO TABS: 650.0000 mg | ORAL_TABLET | Freq: Four times a day (QID) | ORAL | Status: DC | PRN

## 2018-07-14 MED ORDER — METHOCARBAMOL 500 MG PO TABS
500.0000 mg | ORAL_TABLET | Freq: Three times a day (TID) | ORAL | Status: DC | PRN
  Administered 2018-07-14: 500 mg via ORAL
  Filled 2018-07-14: qty 1

## 2018-07-14 MED ORDER — SODIUM CHLORIDE 0.9 % IV SOLN
INTRAVENOUS | Status: DC
  Administered 2018-07-14 – 2018-07-15 (×3): via INTRAVENOUS

## 2018-07-14 MED ORDER — ONDANSETRON HCL 4 MG/2ML IJ SOLN: 4.0000 mg | Freq: Three times a day (TID) | INTRAMUSCULAR | Status: DC | PRN

## 2018-07-14 MED ORDER — MAGNESIUM SULFATE 2 GM/50ML IV SOLN
2.0000 g | Freq: Once | INTRAVENOUS | Status: AC
  Administered 2018-07-14: 2 g via INTRAVENOUS
  Filled 2018-07-14: qty 50

## 2018-07-14 MED ORDER — ENOXAPARIN SODIUM 40 MG/0.4ML ~~LOC~~ SOLN
40.0000 mg | Freq: Every day | SUBCUTANEOUS | Status: DC
  Administered 2018-07-14 – 2018-07-15 (×2): 40 mg via SUBCUTANEOUS
  Filled 2018-07-14 (×2): qty 0.4

## 2018-07-14 NOTE — ED Notes (Addendum)
Abrasions/wounds on upper and lower extremities cleaned and dressing applied.

## 2018-07-14 NOTE — Consult Note (Signed)
[facility] Nurse wound consult note Reason for Consult: Consult requested for multiple full thickness skin tears and abrasions.   Wound type: Left arm 2X2X.2cm, 2X.2X.2cm, skin approximated over 85% of wounds, 15% red moist wound beds, small amt pink drainage. Right arm 2X1X.2cm, skin approximated over 90% of wounds, 10% red moist wound beds, small amt pink drainage. Left anterior leg with entire anterior calf involved.  skin approximated over 60% of wounds, 40% red moist wound beds. Wound depth in multiple areas is .3cm. Mod amt pink drainage, unable to approximate skin flaps over large gaps.  Applied steristrips to help keep skin flaps in place. Right leg with full thickness hematoma/wounds; 3X3X.3cm and 2X2X.2cm, mod amt old clotted blood removed, revealing dark reddish purple wound bed, no active bleeding, small amt pink drainage. Dressing procedure/placement/frequency: Foam dressings to protect and promote healing to bilat arms. Xeroform gauze to promote moist healing to bilat legs. Son at bedside; discussed plan of care with him and the patient. Please re-consult if further assistance is needed.  Thank-you,  Julien Girt MSN, [place], [place], [place], [place]

## 2018-07-14 NOTE — Patient Outreach (Signed)
[facility]) Care Management  07/14/2018  ADIR SCHICKER 07-16-1946 [id number]  Planned home visit for this afternoon. Upon chart review it is noted the patient is currently inpatient at [facility]. BSW has notified hospital liaison, Marthenia Rolling as well the [facility], The RNCM, [name] of patients current disposition. BSW will continue to monitor for discharge date. Upon discharge, BSW will contact the patient to reschedule home visit to assist with community resource needs.  Daneen Schick, BSW, CDP [facility] [phone]
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
## 2018-07-14 NOTE — Evaluation (Signed)
Physical Therapy Evaluation Patient Details Name: Jesus Daniel MRN: [id number] DOB: [birth year] Today's Date: 07/14/2018   History of Present Illness  72 y.o. male with medical history significant of hypertension, GERD, gout, depression, anxiety, alcohol abuse, BPH, melanoma, iron deficiency anemia, right eye blindness, who presents with fall, multiple sites of skin laceration, nausea, vomiting.  Clinical Impression  PTA reports he was having increased difficulty with ambulation with his RW for household distances. Pt's sons come and bathe him 2x week, and his sister-in law picks up his groceries. He has had increased difficulty in standing and waiting for his food to microwave. Pt states he has not been eating a lot lately. Pt currently limited in his safe mobility by decreased strength and balance as well as decreased LE ROM. Pt requires mod A for coming supine to sit and maxA for sit>stand, maxAx2 for stand pivot transfer to/from [facility]. PT recommends SNF level rehab at d/d to address strength and mobility deficits. PT will continue to follow acutely.     Follow Up Recommendations SNF    Equipment Recommendations  Other (comment)(TBD at next venue)    Recommendations for Other Services       Precautions / Restrictions Precautions Precautions: Fall Restrictions Weight Bearing Restrictions: No      Mobility  Bed Mobility Overal bed mobility: Needs Assistance Bed Mobility: Supine to Sit;Sit to Supine     Supine to sit: Mod assist Sit to supine: Max assist   General bed mobility comments: mod A for trunk to upright and pad scoot of hips to EoB, maxA for management of LE back into bed  Transfers Overall transfer level: Needs assistance   Transfers: Stand Pivot Transfers;Sit to/from Stand Sit to Stand: Mod assist Stand pivot transfers: Max assist;+2 safety/equipment       General transfer comment: mod A for powerup and steadying for NT to remove pants, maxAx2 for safe stand  pivot transfer to/from [facility]      Balance Overall balance assessment: Needs assistance Sitting-balance support: Feet supported;Bilateral upper extremity supported Sitting balance-Leahy Scale: Poor     Standing balance support: Bilateral upper extremity supported;During functional activity Standing balance-Leahy Scale: Zero                               Pertinent Vitals/Pain Pain Assessment: Faces Faces Pain Scale: Hurts little more Pain Location: legs Pain Descriptors / Indicators: Guarding;Discomfort;Sore Pain Intervention(s): Limited activity within patient's tolerance;Monitored during session;Repositioned    Home Living Family/patient expects to be discharged to:: Private residence Living Arrangements: Alone Available Help at Discharge: Family;Neighbor;Available PRN/intermittently Type of Home: House Home Access: Ramped entrance     Home Layout: One level Home Equipment: Walker - 2 wheels;Shower seat      Prior Function Level of Independence: Needs assistance   Gait / Transfers Assistance Needed: ambulated household distances with RW, drives for pleasure, requires assist for limited community distances (Dr's appointments)  ADL's / Homemaking Assistance Needed: sister-in law does shopping, sons bathe 2x week, reports independence with dressing, can no longer stand long enough by microwave for microwaved meals           Extremity/Trunk Assessment   Upper Extremity Assessment Upper Extremity Assessment: Generalized weakness    Lower Extremity Assessment Lower Extremity Assessment: RLE deficits/detail;LLE deficits/detail RLE Deficits / Details: calves bandaged secondary to multiple skin tears with falling, knee lacks full extension and hip externally rotated, even with physical shift of LE  for better power up unable to maintain. strength grossly 2+/5  RLE Sensation: (unable to determine due to bandaging) RLE Coordination: decreased gross motor;decreased  fine motor LLE Deficits / Details: calves bandaged, knee lacking full extension, ankle lacking dorsiflexion, strength grossly 2+/5 LLE Sensation: (unable to assess due to bandaging) LLE Coordination: decreased fine motor;decreased gross motor    Cervical / Trunk Assessment Cervical / Trunk Assessment: Kyphotic  Communication   Communication: No difficulties  Cognition Arousal/Alertness: Awake/alert Behavior During Therapy: WFL for tasks assessed/performed Overall Cognitive Status: No family/caregiver present to determine baseline cognitive functioning                                 General Comments: oriented, but has decreased safety awareness and limited awareness of his limitations      General Comments General comments (skin integrity, edema, etc.): VSS throughout session        Assessment/Plan    PT Assessment Patient needs continued PT services  PT Problem List Decreased strength;Decreased range of motion;Decreased activity tolerance;Decreased balance;Decreased mobility;Decreased coordination;Decreased cognition;Decreased safety awareness       PT Treatment Interventions DME instruction;Gait training;Functional mobility training;Therapeutic activities;Therapeutic exercise;Balance training;Patient/family education;Cognitive remediation    PT Goals (Current goals can be found in the Care Plan section)  Acute Rehab PT Goals Patient Stated Goal: go home PT Goal Formulation: With patient Time For Goal Achievement: 07/28/18 Potential to Achieve Goals: Fair    Frequency Min 2X/week   Barriers to discharge Decreased caregiver support family only available intermittently        AM-PAC PT "6 Clicks" Daily Activity  Outcome Measure Difficulty turning over in bed (including adjusting bedclothes, sheets and blankets)?: A Lot Difficulty moving from lying on back to sitting on the side of the bed? : Unable Difficulty sitting down on and standing up from a chair  with arms (e.g., wheelchair, bedside commode, etc,.)?: Unable Help needed moving to and from a bed to chair (including a wheelchair)?: A Lot Help needed walking in hospital room?: Total Help needed climbing 3-5 steps with a railing? : Total 6 Click Score: 8    End of Session Equipment Utilized During Treatment: Gait belt Activity Tolerance: Patient limited by fatigue Patient left: in bed;with call bell/phone within reach;with bed alarm set Nurse Communication: Mobility status PT Visit Diagnosis: Unsteadiness on feet (R26.81);Other abnormalities of gait and mobility (R26.89);Repeated falls (R29.6);Muscle weakness (generalized) (M62.81);History of falling (Z91.81);Difficulty in walking, not elsewhere classified (R26.2);Pain Pain - Right/Left: Right Pain - part of body: Hip;Leg;Knee    Time: 0177-9390 PT Time Calculation (min) (ACUTE ONLY): 30 min   Charges:   PT Evaluation $PT Eval Moderate Complexity: 1 Mod PT Treatments $Therapeutic Activity: 8-22 mins        Seriyah Collison B. Migdalia Dk PT, DPT Acute Rehabilitation  [phone] Pager [phone]    [place] 07/14/2018, 9:46 AM

## 2018-07-14 NOTE — Progress Notes (Signed)
Patient has finished 2nd bottle of contrast for CT scan without difficulty and ready for CT scan planned for 0745.

## 2018-07-14 NOTE — NC FL2 (Signed)
[place] LEVEL OF CARE SCREENING TOOL     IDENTIFICATION  Patient Name: Jesus Daniel Birthdate: Jan 08, 1946 Sex: male Admission Date (Current Location): 07/13/2018  [facility] and [place] Number:  Herbalist and Address:  [facility], [place] [address] [place], [place] [address]      Provider Number: [id number]  Attending Physician Name and Address:  Patrecia Pour, MD  Relative Name and Phone Number:  Aithan Farrelly - son, [phone] or Paublo Warshawsky - son, [phone]    Current Level of Care: Hospital Recommended Level of Care: [place] Prior Approval Number:    Date Approved/Denied:   PASRR Number: [id number](Eff. 07/05/2015)  Discharge Plan: SNF    Current Diagnoses: Patient Active Problem List   Diagnosis Date Noted  . HLD (hyperlipidemia) 07/14/2018  . BPH (benign prostatic hyperplasia) 07/14/2018  . Nausea & vomiting 07/14/2018  . Hypokalemia 07/14/2018  . Hyponatremia 07/14/2018  . Hypomagnesemia 07/14/2018  . Multiple skin tears 07/14/2018  . AKI (acute kidney injury) ([place])   . Senile purpura ([place]) 07/04/2017  . Alcohol abuse   . Abscess of right hip   . Fall   . Prosthetic hip infection ([place])   . Staphylococcus aureus infection   . Right hip pain 03/31/2016  . Arthritis of right hip 10/24/2015  . Edema 10/23/2015  . Primary localized osteoarthrosis of pelvic region 10/18/2015  . DJD (degenerative joint disease) 10/17/2015  . Anxiety 10/17/2015  . Depression 10/17/2015  . GERD (gastroesophageal reflux disease) 10/17/2015  . Arthritis 10/17/2015  . Melanoma of skin ([place]) 10/17/2015  . HTN (hypertension) 04/05/2012    Orientation RESPIRATION BLADDER Height & Weight     Self, Time, Situation, Place  Normal Continent Weight:   Height:     BEHAVIORAL SYMPTOMS/MOOD NEUROLOGICAL BOWEL NUTRITION STATUS      Continent Diet(Heart-healthy)  AMBULATORY STATUS COMMUNICATION OF NEEDS Skin   Total  Care(Patient did not ambulate with PT on 8/6 due to decreased strength and balance) Verbally Other (Comment), Skin abrasions(Abrasions on right/left arms, legs and toes; Foam dressing on bilateral big toes, vasoline, gauze, abd & gauze on legs & arms. 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) Sight Info: Impaired Hearing Info: Adequate Speech Info: Adequate    SPECIAL CARE FACTORS FREQUENCY  PT (By licensed PT), OT (By licensed OT)     PT Frequency: Evaluated 8/6 and a minimum of 2X per week therapy recommended during acute inpatient stay OT Frequency: Evaluated 8/6 and a minimum of 2X poer week therapy recommended during acute inpatient stay            Contractures Contractures Info: Not present    Additional Factors Info  Code Status, Allergies Code Status Info: Full Allergies Info: No known allergies           Current Medications (07/14/2018):  This is the current hospital active medication list Current Facility-Administered Medications  Medication Dose Route Frequency Provider Last Rate Last Dose  . 0.9 %  sodium chloride infusion   Intravenous Continuous Ivor Costa, MD [phone]  mL/hr at 07/14/18 0436    . acetaminophen (TYLENOL) tablet 650 mg  650 mg Oral Q6H PRN Ivor Costa, MD       Or  . acetaminophen (TYLENOL) suppository 650 mg  650 mg Rectal Q6H PRN Ivor Costa, MD      . atorvastatin (LIPITOR) tablet 10 mg  10 mg Oral Daily Ivor Costa, MD   10 mg at 07/14/18 1051  . enoxaparin (LOVENOX) injection 40 mg  40 mg Subcutaneous Daily Ivor Costa, MD   40 mg at 07/14/18 1051  . hydrALAZINE  (APRESOLINE) injection 5 mg  5 mg Intravenous Q2H PRN Ivor Costa, MD      . hydrOXYzine (ATARAX/VISTARIL) tablet 10 mg  10 mg Oral Q6H PRN Ivor Costa, MD      . iopamidol (ISOVUE-300) 61 % injection           . LORazepam (ATIVAN) injection 0-4 mg  0-4 mg Intravenous Q6H Ivor Costa, MD       Or  . LORazepam (ATIVAN) tablet 0-4 mg  0-4 mg Oral Q6H Ivor Costa, MD      . Derrill Memo ON 07/16/2018] LORazepam (ATIVAN) injection 0-4 mg  0-4 mg Intravenous Q12H Ivor Costa, MD       Or  . Derrill Memo ON 07/16/2018] LORazepam (ATIVAN) tablet 0-4 mg  0-4 mg Oral Q12H Ivor Costa, MD      . methocarbamol (ROBAXIN) tablet 500 mg  500 mg Oral Q8H PRN Ivor Costa, MD      . ondansetron [facility]) injection 4 mg  4 mg Intravenous Q8H PRN Ivor Costa, MD      . oxyCODONE (Oxy IR/ROXICODONE) immediate release tablet 5 mg  5 mg Oral Q6H PRN Ivor Costa, MD   5 mg at 07/14/18 1102  . pantoprazole (PROTONIX) EC tablet 40 mg  40 mg Oral Q1200 Ivor Costa, MD      . polyethylene glycol (MIRALAX / GLYCOLAX) packet 17 g  17 g Oral Daily PRN Ivor Costa, MD      . thiamine (VITAMIN B-1) tablet 100 mg  100 mg Oral Daily Ivor Costa, MD   100 mg at 07/14/18 1051   Or  . thiamine (B-1) injection 100 mg  100 mg Intravenous Daily Ivor Costa, MD      . zolpidem (AMBIEN) tablet 5 mg  5 mg Oral QHS PRN Ivor Costa, MD         Discharge Medications: Please see discharge summary for a list of discharge medications.  Relevant Imaging Results:  Relevant Lab Results:   Additional Information [id number]  Sable Feil, LCSW

## 2018-07-14 NOTE — Evaluation (Addendum)
Occupational Therapy Evaluation Patient Details Name: Jesus Daniel MRN: [id number] DOB: [birth year] Today's Date: 07/14/2018    History of Present Illness 72 y.o. male with medical history significant of hypertension, GERD, gout, depression, anxiety, alcohol abuse, BPH, melanoma, iron deficiency anemia, right eye blindness, who presents with fall, multiple sites of skin laceration, nausea, vomiting.   Clinical Impression   Eval limited due to pt's report of fatigue and B LE pain, but agreeable to assessment. Pt mod A with rolling in bed and declined sitting EOB or OOB activity due to fatigue and B LES pain. Pt requires max - total A with with ADLs/selfcare at this time. Pt with decline in function and safety with ADLs and ADL mobility with decreased strength, balance and endurance. PTA pt lived at home alone with intermittent assist from family for bathing, home mgt and grocery shopping. Pt was making meals in microwave. Pt with hx of falls and previous hip surgeries. Pt would benefit from acute OT services to address impairments to maximize level of function and safety    Follow Up Recommendations  SNF    Equipment Recommendations  Other (comment)(TBD at SNF)    Recommendations for Other Services       Precautions / Restrictions Precautions Precautions: Fall Restrictions Weight Bearing Restrictions: No      Mobility Bed Mobility Overal bed mobility: Needs Assistance Bed Mobility: Rolling Rolling: Mod assist         General bed mobility comments: pt unable to sit EOB due to reports of fatigue and increased LE pain with movement  Transfers Overall transfer level: Needs assistance               General transfer comment: NT, pt is max A + 2 for SPTs per PT note    Balance Overall balance assessment: Needs assistance     Sitting balance - Comments: NT, Poor per PT note       Standing balance comment: NT, zero per PT note                           ADL  either performed or assessed with clinical judgement   ADL Overall ADL's : Needs assistance/impaired Eating/Feeding: Set up;Sitting;Bed level   Grooming: Wash/dry hands;Wash/dry face;Supervision/safety;Set up;Bed level;Sitting   Upper Body Bathing: Maximal assistance;Bed level;Sitting   Lower Body Bathing: Total assistance   Upper Body Dressing : Maximal assistance;Bed level;Sitting   Lower Body Dressing: Total assistance     Toilet Transfer Details (indicate cue type and reason): NT, pt is max A + 2 for SPTs per PT note Toileting- Clothing Manipulation and Hygiene: Total assistance       Functional mobility during ADLs: (NT, pt is max A + 2 for SPTs per PT note)       Vision Baseline Vision/History: Wears glasses Patient Visual Report: No change from baseline       Perception     Praxis      Pertinent Vitals/Pain Pain Assessment: 0-10 Pain Score: 5  Pain Location: B LEs Pain Descriptors / Indicators: Grimacing;Guarding;Discomfort;Sore Pain Intervention(s): Limited activity within patient's tolerance;Monitored during session;Repositioned     Hand Dominance     Extremity/Trunk Assessment             Communication     Cognition Arousal/Alertness: Awake/alert Behavior During Therapy: WFL for tasks assessed/performed Overall Cognitive Status: No family/caregiver present to determine baseline cognitive functioning  General Comments: oriented, but has decreased safety awareness and limited awareness of his limitations   General Comments       Exercises     Shoulder Instructions      Home Living                                          Prior Functioning/Environment                   OT Problem List:        OT Treatment/Interventions:      OT Goals(Current goals can be found in the care plan section) Acute Rehab OT Goals Patient Stated Goal: go home OT Goal Formulation: With  patient Time For Goal Achievement: 07/28/18 Potential to Achieve Goals: Good ADL Goals Pt Will Perform Grooming: with min assist;with min guard assist;sitting Pt Will Perform Upper Body Bathing: with mod assist;sitting Pt Will Perform Lower Body Bathing: with max assist;with mod assist;sitting/lateral leans Pt Will Perform Upper Body Dressing: with mod assist;sitting Pt Will Transfer to Toilet: with max assist;with mod assist;bedside commode;stand pivot transfer Additional ADL Goal #1: Pt will complete bed mobility with min A to sit EOB in prep for grooming and ADL tasks  OT Frequency: Min 2X/week   Barriers to D/C:            Co-evaluation              AM-PAC PT "6 Clicks" Daily Activity     Outcome Measure Help from another person eating meals?: None Help from another person taking care of personal grooming?: A Little Help from another person toileting, which includes using toliet, bedpan, or urinal?: Total Help from another person bathing (including washing, rinsing, drying)?: Total Help from another person to put on and taking off regular upper body clothing?: A Lot Help from another person to put on and taking off regular lower body clothing?: Total 6 Click Score: 12   End of Session    Activity Tolerance: Patient limited by fatigue;Patient limited by pain Patient left: in bed;with bed alarm set  OT Visit Diagnosis: Muscle weakness (generalized) (M62.81);History of falling (Z91.81);Repeated falls (R29.6);Pain Pain - part of body: Leg(B LEs)                Time: 7948-0165 OT Time Calculation (min): 17 min Charges:  OT General Charges $OT Visit: 1 Visit OT Evaluation $OT Eval Moderate Complexity: 1 Mod    Britt Bottom 07/14/2018, 1:02 PM

## 2018-07-15 ENCOUNTER — Encounter: Payer: Self-pay | Admitting: *Deleted

## 2018-07-15 DIAGNOSIS — D539 Nutritional anemia, unspecified: Secondary | ICD-10-CM

## 2018-07-15 LAB — RETICULOCYTES
RBC.: 1.97 MIL/uL — ABNORMAL LOW (ref 4.22–5.81)
Retic Count, Absolute: 49.3 10*3/uL (ref 19.0–186.0)
Retic Ct Pct: 2.5 % (ref 0.4–3.1)

## 2018-07-15 LAB — CBC
HEMATOCRIT: 19.6 % — AB (ref 39.0–52.0)
HEMOGLOBIN: 6.5 g/dL — AB (ref 13.0–17.0)
MCH: 33.9 pg (ref 26.0–34.0)
MCHC: 33.2 g/dL (ref 30.0–36.0)
MCV: 102.1 fL — AB (ref 78.0–100.0)
Platelets: 82 10*3/uL — ABNORMAL LOW (ref 150–400)
RBC: 1.92 MIL/uL — AB (ref 4.22–5.81)
RDW: 12.9 % (ref 11.5–15.5)
WBC: 3.6 10*3/uL — AB (ref 4.0–10.5)

## 2018-07-15 LAB — IRON AND TIBC: IRON: 79 ug/dL (ref 45–182)

## 2018-07-15 LAB — BASIC METABOLIC PANEL
ANION GAP: 8 (ref 5–15)
BUN: 22 mg/dL (ref 8–23)
CALCIUM: 6.9 mg/dL — AB (ref 8.9–10.3)
CO2: 25 mmol/L (ref 22–32)
Chloride: 97 mmol/L — ABNORMAL LOW (ref 98–111)
Creatinine, Ser: 1.27 mg/dL — ABNORMAL HIGH (ref 0.61–1.24)
GFR calc Af Amer: >60 mL/min (ref >60)
GFR calc non Af Amer: 55 mL/min — ABNORMAL LOW (ref >60)
GLUCOSE: 93 mg/dL (ref 70–99)
POTASSIUM: 3 mmol/L — AB (ref 3.5–5.1)
Sodium: 130 mmol/L — ABNORMAL LOW (ref 135–145)

## 2018-07-15 LAB — HEMOGLOBIN AND HEMATOCRIT, BLOOD
HCT: 23.8 % — ABNORMAL LOW (ref 39.0–52.0)
HEMOGLOBIN: 7.9 g/dL — AB (ref 13.0–17.0)

## 2018-07-15 LAB — FERRITIN: FERRITIN: 156 ng/mL (ref 24–336)

## 2018-07-15 LAB — VITAMIN B12: Vitamin B-12: 1051 pg/mL — ABNORMAL HIGH (ref 180–914)

## 2018-07-15 LAB — PREPARE RBC (CROSSMATCH)

## 2018-07-15 LAB — FOLATE: Folate: 5.3 ng/mL — ABNORMAL LOW (ref >5.9)

## 2018-07-15 MED ORDER — FOLIC ACID 5 MG/ML IJ SOLN
1.0000 mg | Freq: Every day | INTRAMUSCULAR | Status: DC
  Administered 2018-07-15 – 2018-07-16 (×2): 1 mg via INTRAVENOUS
  Filled 2018-07-15 (×3): qty 0.2

## 2018-07-15 MED ORDER — MAGNESIUM OXIDE 400 (241.3 MG) MG PO TABS
400.0000 mg | ORAL_TABLET | Freq: Once | ORAL | Status: AC
  Administered 2018-07-15: 400 mg via ORAL
  Filled 2018-07-15: qty 1

## 2018-07-15 MED ORDER — SODIUM CHLORIDE 0.9% IV SOLUTION
Freq: Once | INTRAVENOUS | Status: AC
  Administered 2018-07-15: 15:00:00 via INTRAVENOUS

## 2018-07-15 MED ORDER — POTASSIUM CHLORIDE 2 MEQ/ML IV SOLN
INTRAVENOUS | Status: DC
Start: 2018-07-15 — End: 2018-07-17
  Administered 2018-07-15 – 2018-07-17 (×4): via INTRAVENOUS
  Filled 2018-07-15 (×8): qty 1000

## 2018-07-15 MED ORDER — ENSURE ENLIVE PO LIQD
237.0000 mL | Freq: Two times a day (BID) | ORAL | Status: DC
  Administered 2018-07-15 – 2018-07-17 (×4): 237 mL via ORAL

## 2018-07-15 NOTE — Progress Notes (Signed)
1 Unit PRBC's complete, VSS, Denies paiin.  No apparent reaction.

## 2018-07-15 NOTE — Progress Notes (Signed)
Initial Nutrition Assessment  DOCUMENTATION CODES:   Obesity unspecified  INTERVENTION:   Ensure Enlive po BID, each supplement provides 350 kcal and 20 grams of protein  NUTRITION DIAGNOSIS:   Increased nutrient needs related to wound healing as evidenced by estimated needs.  GOAL:   Patient will meet greater than or equal to 90% of their needs  MONITOR:   PO intake, Supplement acceptance, Weight trends, Labs  REASON FOR ASSESSMENT:   Consult Poor PO  ASSESSMENT:   Patient with PMH significant for HTN, GERD, depression, anxiety, BPH, IDA, right eye blindness, and alcohol abuse. Presents this admission with complaint of mechanical fall likely after alcohol consumption. Admitted for multiple skin lacerations.    Pt reports he began to notice a decrease in PO intake around two years ago due to his excessive drinking. States he typically drinks a 6 pack of [employer] everyday and has for the last 8 years after his wife died. Pt eats one meal per day that consist of chicken supreme meal, steak with vegetables, or hamburgers. Pt does not use supplementation at home. During this admission meal completions charted as 50-100%. He had multiple complaints about the cafeteria food. Despite this, he ate 75% of his chicken, green beans, and apple pie last night. Discussed the importance of protein intake for wound healing. Pt amendable to Ensure.   MD notes pt presented with nausea/vomting and may have alcoholic gastritis. Pt denies these symptoms today.   Pt endorses a UBW of 221 lb and denies recent wt loss. Records indicate pt has gained weight from 193 lb on 07/04/18 and 221 lb this admission. Nutrition-Focused physical exam completed.   Medications reviewed and include: thiamine, NS 30 mEq @ 125 ml/hr Labs reviewed: Na 130 (L) K 3.0 (L)-replacing Mg 1.3 (L) corrected calcium 8.2 (L)  NUTRITION - FOCUSED PHYSICAL EXAM:    Most Recent Value  Orbital Region  No depletion  Upper Arm  Region  No depletion  Thoracic and Lumbar Region  Unable to assess  Buccal Region  No depletion  Temple Region  Moderate depletion  Clavicle Bone Region  No depletion  Clavicle and Acromion Bone Region  No depletion  Scapular Bone Region  Unable to assess  Dorsal Hand  No depletion  Patellar Region  No depletion  Anterior Thigh Region  No depletion  Posterior Calf Region  No depletion  Edema (RD Assessment)  Mild  Hair  Reviewed  Eyes  Reviewed  Mouth  Reviewed  Skin  Reviewed  Nails  Reviewed     Diet Order:   Diet Order           Diet Heart Room service appropriate? Yes; Fluid consistency: Thin  Diet effective now          EDUCATION NEEDS:   Education needs have been addressed  Skin:  Skin Assessment: Skin Integrity Issues:(RD observed BLE drainage soaked through gauze) Skin Integrity Issues:: Other (Comment) Other: L/R arm, L left skin tears, R leg full thickness wound  Last BM:  07/14/18  Height:   Ht Readings from Last 1 Encounters:  07/09/18 5\' 7"  (1.702 m)    Weight:   Wt Readings from Last 1 Encounters:  07/15/18 221 lb 5.5 oz (100.4 kg)    Ideal Body Weight:  67.3 kg  BMI:  Body mass index is 34.67 kg/m.  Estimated Nutritional Needs:   Kcal:  2000-2200 kcal  Protein:  100-110 gram  Fluid:  >/= 2 L/day  Mariana Single RD, LDN Clinical Nutrition Pager # [phone]
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
## 2018-07-15 NOTE — Consult Note (Addendum)
   [facility] CM Inpatient Consult   07/15/2018  Jesus Daniel Apr 10, 1946 [id number]    Met with Jesus Daniel at bedside to obtain [facility] [employer] written consent for ongoing [place] [facility] services. He recently became active with [facility] program. Please see chart review tab then encounters for patient outreach details by [facility] team.  Jesus Daniel is hopeful he will discharge to  SNF. Made him aware that [facility] will continue to follow post hospital discharge.   Will update [facility] Community team on disposition plans and make appropriate [facility] referral once disposition facility is known.  Made inpatient LCSW aware [facility] is following.    Marthenia Rolling, MSN-Ed, RN,BSN [facility] Liaison [phone]

## 2018-07-15 NOTE — Clinical Social Work Note (Signed)
Clinical Social Work Assessment  Patient Details  Name: Jesus Daniel MRN: [id number] Date of Birth: Nov 26, 1946  Date of referral:  07/14/18               Reason for consult:  Facility Placement, Discharge Planning                Permission sought to share information with:  Family Supports Permission granted to share information::  Yes, Verbal Permission Granted  Name::     Jesus Daniel and Jesus Daniel  Agency::     Relationship::  Sons  Contact Information:  Kaspian Muccio - [phone] and Vaiden Adames - [phone]  Housing/Transportation Living arrangements for the past 2 months:  [place] of Information:  Patient Patient Interpreter Needed:  None Criminal Activity/Legal Involvement Pertinent to Current Situation/Hospitalization:  No - Comment as needed Significant Relationships:  Adult Children Lives with:  Self Do you feel safe going back to the place where you live?  No(Patient agreeable to ST rehab before returning home) Need for family participation in patient care:  Yes (Comment)  Care giving concerns:  Patient reported that he lives alone and would not have adequate care at home upon discharge. He is agreeable to ST rehab before returning home.  Social Worker assessment / plan: On 8/6 CSW talked with patient at the bedside regarding his discharge disposition. [name] was sitting up in bed and was alert, oriented and agreeable to talking with CSW. Patient reported that he has 2 sons who live in [place] and are approximately 12 miles from his home. CSW explained discharge planning role and recommendation of ST rehab. When asked, patient reported that he has been to rehab before at [facility], about a year ago and would like to return there. Patient was agreeable to CSW sending his information to all facilities in case [facility] unable to take him and SNF list provided. [name] requested a private room at the facility, however will go into a  semi-private room if a private is not available.  Employment status:  Retired Char[employer] fundraiser) PT Recommendations:  [place] / Referral to community resources:  [place]  Patient/Family's Response to care:  Patient reported no concerns regarding his care during hospitalization.  Patient/Family's Understanding of and Emotional Response to Diagnosis, Current Treatment, and Prognosis:  Patient expressed understanding of his need for ST rehab before returning home.  Emotional Assessment Appearance:  Appears stated age Attitude/Demeanor/Rapport:  Engaged Affect (typically observed):  Appropriate, Pleasant Orientation:  Oriented to Self, Oriented to Place, Oriented to  Time, Oriented to Situation Alcohol / Substance use:  Tobacco Use, Alcohol Use, Illicit Drugs(Patient reported that he has never smoked, uses smokeless tobacco, drinks 6 cans of beer per week and does not use illicit drugs) Psych involvement (Current and /or in the community):  No (Comment)  Discharge Needs  Concerns to be addressed:  Discharge Planning Concerns Readmission within the last 30 days:  No Current discharge risk:  None Barriers to Discharge:  Continued Medical Work up   [employer], LCSW 07/15/2018, 7:59 AM

## 2018-07-15 NOTE — Clinical Social Work Note (Addendum)
CSW received call from Tammy with [employer] regarding patient and when he would be seen by PT again. Contact made with physical therapy and talked with [facility] and she will not be the therapist to see patient, but will put in a note that Mr. Kluever needs to be seen early Thursday morning dur to discharge and insurance authorization. Tammy contacted and updated. CSW will continue to follow, provided SW intervention services as needed and facilitate discharge to a skilled facility once medically stable for discharge. Projected d/c date per MD is Thursday, 8/8.  Call made to [facility] to advise admissions director that patient will be ready for discharge on 7/8 per MD. CSW advised that they will not have any males beds on Thursday. Visited with patient, shared information and provided him with bed offers and requested another facility selection and patient requested [facility]. Call made to [facility], admissions Director and they are unable to accept patient, no bed availability.  Mr. Sult advised CSW that he would need time to talk with his sons to choose another place as he has only been to [facility].   Visited again with patient at 4:43 pm and [facility] chosen. Call made to [facility] and they can take patient. Call made to Tammy with [employer] and message left regarding facility chosen. CSW will continue to follow and facilitate discharge to SNF once patient discharge and insurance authorization received.     Lesly Joslyn Givens, MSW, LCSW Licensed Clinical Social Worker [place] [phone]

## 2018-07-15 NOTE — Progress Notes (Signed)
CRITICAL VALUE ALERT  Critical Value:  Hemoglobin 6.5  Date & Time Notied:  07/15/18 06:45  Provider Notified: Maryjane Hurter 07/15/18 at 06:53  Orders Received/Actions taken: Order received in epic

## 2018-07-16 LAB — TYPE AND SCREEN
ABO/RH(D): A POS
ANTIBODY SCREEN: NEGATIVE
Unit division: 0

## 2018-07-16 LAB — BASIC METABOLIC PANEL
Anion gap: 8 (ref 5–15)
BUN: 19 mg/dL (ref 8–23)
CALCIUM: 7.5 mg/dL — AB (ref 8.9–10.3)
CO2: 24 mmol/L (ref 22–32)
CREATININE: 0.97 mg/dL (ref 0.61–1.24)
Chloride: 103 mmol/L (ref 98–111)
GFR calc non Af Amer: >60 mL/min (ref >60)
GLUCOSE: 95 mg/dL (ref 70–99)
Potassium: 3.5 mmol/L (ref 3.5–5.1)
Sodium: 135 mmol/L (ref 135–145)

## 2018-07-16 LAB — BPAM RBC
BLOOD PRODUCT EXPIRATION DATE: 201908262359
ISSUE DATE / TIME: 201908071447
UNIT TYPE AND RH: 6200

## 2018-07-16 LAB — CBC
HCT: 23.8 % — ABNORMAL LOW (ref 39.0–52.0)
Hemoglobin: 7.9 g/dL — ABNORMAL LOW (ref 13.0–17.0)
MCH: 32.9 pg (ref 26.0–34.0)
MCHC: 33.2 g/dL (ref 30.0–36.0)
MCV: 99.2 fL (ref 78.0–100.0)
PLATELETS: 97 10*3/uL — AB (ref 150–400)
RBC: 2.4 MIL/uL — ABNORMAL LOW (ref 4.22–5.81)
RDW: 15.1 % (ref 11.5–15.5)
WBC: 4 10*3/uL (ref 4.0–10.5)

## 2018-07-16 LAB — MAGNESIUM: Magnesium: 1.3 mg/dL — ABNORMAL LOW (ref 1.7–2.4)

## 2018-07-16 MED ORDER — OXYCODONE HCL 5 MG PO TABS: 2.5000 mg | ORAL_TABLET | Freq: Two times a day (BID) | ORAL | 0 refills | Status: DC | PRN

## 2018-07-16 MED ORDER — FOLIC ACID 1 MG PO TABS: 1.0000 mg | ORAL_TABLET | Freq: Every day | ORAL | 3 refills | Status: DC

## 2018-07-16 MED ORDER — ENSURE ENLIVE PO LIQD: 237.0000 mL | Freq: Two times a day (BID) | ORAL | 12 refills | Status: DC

## 2018-07-16 MED ORDER — VITAMIN B-1 100 MG PO TABS: 100.0000 mg | ORAL_TABLET | Freq: Every day | ORAL | Status: DC

## 2018-07-16 MED ORDER — POTASSIUM CHLORIDE ER 10 MEQ PO TBCR: 10.0000 meq | EXTENDED_RELEASE_TABLET | Freq: Every day | ORAL | Status: DC

## 2018-07-16 MED ORDER — MAGNESIUM OXIDE 400 MG PO TABS: 400.0000 mg | ORAL_TABLET | Freq: Every day | ORAL | Status: DC

## 2018-07-16 MED ORDER — MAGNESIUM SULFATE 2 GM/50ML IV SOLN
2.0000 g | Freq: Once | INTRAVENOUS | Status: AC
  Administered 2018-07-16: 2 g via INTRAVENOUS
  Filled 2018-07-16: qty 50

## 2018-07-16 NOTE — Progress Notes (Addendum)
UPDATE 2:11 PM:  CSW received call from Tammy at [facility] that the authorization request has been sent for medical director review. Insurance is unsure if patient is already at his baseline and whether or not he will benefit from SNF placement. Medical director will review and then Tammy will call CSW back with decision after.   CSW to continue to follow.  Laveda Abbe, LCSW Clinical Social Worker [phone]   CSW spoke with Tammy at [facility] and the patient's authorization request is still under review. Per Tammy, the request may end up going to the medical director, and then we would not have an answer on authorization today. CSW alerted Tammy that patient has been discharged, and Tammy reported that they have up to 72 hours to decide on authorization status.  CSW to continue to follow.  Laveda Abbe, [place] Clinical Social Worker [phone]

## 2018-07-16 NOTE — Progress Notes (Signed)
Physical Therapy Treatment Patient Details Name: Jesus Daniel MRN: [id number] DOB: Oct 20, 1946 Today's Date: 07/16/2018    History of Present Illness 72 y.o. male with medical history significant of hypertension, GERD, gout, depression, anxiety, alcohol abuse, BPH, melanoma, iron deficiency anemia, right eye blindness, who presents with fall, multiple sites of skin laceration, nausea, vomiting.    PT Comments    Patient progressing slowly towards his goals. Currently requiring heavy moderate assistance for transitioning from sitting to standing. Able to take several steps from bed to recliner using walker and minimal assistance. Decrease in SpO2 to 91% on RA with mobility but quickly increased to 98% with a seated rest break and instructions for pursed lip breathing. Remains limited by gross weakness, leg pain, and decreased endurance. SNF still appropriate discharge recommendation to maximize functional independence and decrease caregiver burden.      Follow Up Recommendations  SNF     Equipment Recommendations  Other (comment)(TBD at next venue)    Recommendations for Other Services       Precautions / Restrictions Precautions Precautions: Fall Restrictions Weight Bearing Restrictions: No    Mobility  Bed Mobility Overal bed mobility: Needs Assistance Bed Mobility: Supine to Sit;Sit to Supine     Supine to sit: Max assist;+2 for physical assistance     General bed mobility comments: able to progress BLE's to edge of bed minimally. max assistance to elevate trunk to sit EOB and use of bed pad to scoot hips forward. initial right lateral lean but able to correct with increased time  Transfers Overall transfer level: Needs assistance Equipment used: Rolling walker (2 wheeled) Transfers: Sit to/from Stand Sit to Stand: Mod assist;+2 safety/equipment;+2 physical assistance         General transfer comment: Heavy moderate assistance to boost up from edge of bed and  recliner. Cues for hand placement. Patient with limited right knee flexion, requiring manual assistance and blocking to prevent right foot from sliding forward.   Ambulation/Gait Ambulation/Gait assistance: Min assist;+2 safety/equipment Gait Distance (Feet): 2 Feet Assistive device: Rolling walker (2 wheeled) Gait Pattern/deviations: Step-to pattern;Trunk flexed;Decreased stride length Gait velocity: decr Gait velocity interpretation: <1.31 ft/sec, indicative of household ambulator General Gait Details: Able to take steps from bed to recliner using walker. Cues for upright positioning and initiation of steps.    Stairs             Wheelchair Mobility    Modified Rankin (Stroke Patients Only)       Balance Overall balance assessment: Needs assistance Sitting-balance support: Feet supported;Bilateral upper extremity supported Sitting balance-Leahy Scale: Fair     Standing balance support: Bilateral upper extremity supported;During functional activity Standing balance-Leahy Scale: Poor Standing balance comment: heavily reliant on external support                            Cognition Arousal/Alertness: Awake/alert Behavior During Therapy: WFL for tasks assessed/performed Overall Cognitive Status: No family/caregiver present to determine baseline cognitive functioning                                 General Comments: oriented, but has decreased safety awareness and limited awareness of his limitations      Exercises Other Exercises Other Exercises: x2 sit to stands for functional strengthening    General Comments        Pertinent Vitals/Pain Pain Assessment: Faces Faces Pain  Scale: Hurts little more Pain Location: legs Pain Descriptors / Indicators: Guarding;Discomfort;Sore Pain Intervention(s): Limited activity within patient's tolerance;Monitored during session    Home Living                      Prior Function             PT Goals (current goals can now be found in the care plan section) Acute Rehab PT Goals Patient Stated Goal: go home PT Goal Formulation: With patient Time For Goal Achievement: 07/28/18 Potential to Achieve Goals: Fair Progress towards PT goals: Progressing toward goals    Frequency    Min 2X/week      PT Plan Current plan remains appropriate    Co-evaluation              AM-PAC PT "6 Clicks" Daily Activity  Outcome Measure  Difficulty turning over in bed (including adjusting bedclothes, sheets and blankets)?: A Lot Difficulty moving from lying on back to sitting on the side of the bed? : Unable Difficulty sitting down on and standing up from a chair with arms (e.g., wheelchair, bedside commode, etc,.)?: Unable Help needed moving to and from a bed to chair (including a wheelchair)?: A Little Help needed walking in hospital room?: A Lot Help needed climbing 3-5 steps with a railing? : Total 6 Click Score: 10    End of Session Equipment Utilized During Treatment: Gait belt Activity Tolerance: Patient tolerated treatment well Patient left: with call bell/phone within reach;in chair;with chair alarm set Nurse Communication: Mobility status PT Visit Diagnosis: Unsteadiness on feet (R26.81);Other abnormalities of gait and mobility (R26.89);Repeated falls (R29.6);Muscle weakness (generalized) (M62.81);History of falling (Z91.81);Difficulty in walking, not elsewhere classified (R26.2);Pain Pain - Right/Left: Right Pain - part of body: Hip;Leg;Knee     Time: 1001-1023 PT Time Calculation (min) (ACUTE ONLY): 22 min  Charges:  $Therapeutic Activity: 8-22 mins                     Ellamae Sia, PT, DPT Acute Rehabilitation Services  Pager: [place] 07/16/2018, 10:35 AM

## 2018-07-16 NOTE — Progress Notes (Signed)
CSW contacted by RN at [facility] that the patient's request for SNF placement has gone to a peer-to-peer review. CSW provided information to MD, awaiting decision.  Patient doesn't have insurance approval for admission to SNF at this time, cannot discharge until prior authorization has been received. MD aware of barrier to discharge.  CSW to follow.  Laveda Abbe, [place] Clinical Social Worker [phone]

## 2018-07-16 NOTE — Progress Notes (Signed)
Physical Therapy Treatment Patient Details Name: Jesus Daniel MRN: [id number] DOB: 04/09/1946 Today's Date: 07/16/2018    History of Present Illness 72 y.o. male with medical history significant of hypertension, GERD, gout, depression, anxiety, alcohol abuse, BPH, melanoma, iron deficiency anemia, right eye blindness, who presents with fall, multiple sites of skin laceration, nausea, vomiting.    PT Comments    Patient seen for additional session to assist nurse tech transfer patient from recliner to chair. Patient reporting buttocks pain from sitting despite repositioning. Continues to require heavy moderate assistance for transitioning to standing from recliner to walker and minimal assistance to take steps from recliner to bed. Remains grossly deconditioned with diminished activity tolerance and will benefit from SNF at discharge.     Follow Up Recommendations  SNF     Equipment Recommendations  Other (comment)(TBD at next venue)    Recommendations for Other Services       Precautions / Restrictions Precautions Precautions: Fall Restrictions Weight Bearing Restrictions: No    Mobility  Bed Mobility Overal bed mobility: Needs Assistance Bed Mobility: Sit to Supine     Supine to sit: Max assist;+2 for physical assistance Sit to supine: Mod assist;+2 for physical assistance   General bed mobility comments: mod assist to elevate BLE's into bed   Transfers Overall transfer level: Needs assistance Equipment used: Rolling walker (2 wheeled) Transfers: Sit to/from Stand Sit to Stand: Mod assist;+2 safety/equipment;+2 physical assistance         General transfer comment: Heavy moderate assistance to boost up from recliner and to provide anterior weight shift. Cues for hand placement. Patient with limited right knee flexion, requiring manual assistance and blocking to prevent right foot from sliding forward.   Ambulation/Gait Ambulation/Gait assistance: Min assist;+2  safety/equipment Gait Distance (Feet): 2 Feet Assistive device: Rolling walker (2 wheeled) Gait Pattern/deviations: Step-to pattern;Trunk flexed;Decreased stride length Gait velocity: decr Gait velocity interpretation: <1.31 ft/sec, indicative of household ambulator General Gait Details: Able to take steps from recliner to bed using walker. Patient with increased right foot external rotation which is baseline. Trunk flexed despite cues for upright posture.    Stairs             Wheelchair Mobility    Modified Rankin (Stroke Patients Only)       Balance Overall balance assessment: Needs assistance Sitting-balance support: Feet supported;Bilateral upper extremity supported Sitting balance-Leahy Scale: Fair     Standing balance support: Bilateral upper extremity supported;During functional activity Standing balance-Leahy Scale: Poor Standing balance comment: heavily reliant on external support                            Cognition Arousal/Alertness: Awake/alert Behavior During Therapy: WFL for tasks assessed/performed Overall Cognitive Status: No family/caregiver present to determine baseline cognitive functioning                                 General Comments: oriented, but has decreased safety awareness and limited awareness of his limitations      Exercises Other Exercises Other Exercises: x2 sit to stands for functional strengthening    General Comments        Pertinent Vitals/Pain Pain Assessment: Faces Faces Pain Scale: Hurts little more Pain Location: legs Pain Descriptors / Indicators: Guarding;Discomfort;Sore Pain Intervention(s): Limited activity within patient's tolerance;Monitored during session    Home Living  Prior Function            PT Goals (current goals can now be found in the care plan section) Acute Rehab PT Goals Patient Stated Goal: go home PT Goal Formulation: With  patient Time For Goal Achievement: 07/28/18 Potential to Achieve Goals: Fair Progress towards PT goals: Progressing toward goals    Frequency    Min 2X/week      PT Plan Current plan remains appropriate    Co-evaluation              AM-PAC PT "6 Clicks" Daily Activity  Outcome Measure  Difficulty turning over in bed (including adjusting bedclothes, sheets and blankets)?: A Lot Difficulty moving from lying on back to sitting on the side of the bed? : Unable Difficulty sitting down on and standing up from a chair with arms (e.g., wheelchair, bedside commode, etc,.)?: Unable Help needed moving to and from a bed to chair (including a wheelchair)?: A Little Help needed walking in hospital room?: A Lot Help needed climbing 3-5 steps with a railing? : Total 6 Click Score: 10    End of Session Equipment Utilized During Treatment: Gait belt Activity Tolerance: Patient tolerated treatment well Patient left: with call bell/phone within reach;in chair;with chair alarm set Nurse Communication: Mobility status PT Visit Diagnosis: Unsteadiness on feet (R26.81);Other abnormalities of gait and mobility (R26.89);Repeated falls (R29.6);Muscle weakness (generalized) (M62.81);History of falling (Z91.81);Difficulty in walking, not elsewhere classified (R26.2);Pain Pain - Right/Left: Right Pain - part of body: Hip;Leg;Knee     Time: 0814-4818 PT Time Calculation (min) (ACUTE ONLY): 8 min  Charges:  $Therapeutic Activity: 8-22 mins                    Ellamae Sia, PT, DPT Acute Rehabilitation Services  Pager: [place] 07/16/2018, 11:36 AM

## 2018-07-16 NOTE — Progress Notes (Signed)
CSW following for discharge plan. CSW contacted On Call RN with [employer] after updated PT note was put into the chart so that they could review with the authorization request. CSW left voicemail for [employer] RN as no one answered the on-call phone.   Patient's authorization for SNF placement is still under insurance review; patient will not be able to admit to SNF without prior authorization.  CSW to continue to follow.  Laveda Abbe, [place] Clinical Social Worker [phone]
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
## 2018-07-17 ENCOUNTER — Other Ambulatory Visit: Payer: Self-pay

## 2018-07-17 DIAGNOSIS — E871 Hypo-osmolality and hyponatremia: Secondary | ICD-10-CM | POA: Diagnosis not present

## 2018-07-17 DIAGNOSIS — R52 Pain, unspecified: Secondary | ICD-10-CM | POA: Diagnosis not present

## 2018-07-17 DIAGNOSIS — K219 Gastro-esophageal reflux disease without esophagitis: Secondary | ICD-10-CM | POA: Diagnosis not present

## 2018-07-17 DIAGNOSIS — Z9181 History of falling: Secondary | ICD-10-CM | POA: Diagnosis not present

## 2018-07-17 DIAGNOSIS — J219 Acute bronchiolitis, unspecified: Secondary | ICD-10-CM | POA: Diagnosis not present

## 2018-07-17 DIAGNOSIS — D649 Anemia, unspecified: Secondary | ICD-10-CM | POA: Diagnosis not present

## 2018-07-17 DIAGNOSIS — F1010 Alcohol abuse, uncomplicated: Secondary | ICD-10-CM | POA: Diagnosis not present

## 2018-07-17 DIAGNOSIS — Z111 Encounter for screening for respiratory tuberculosis: Secondary | ICD-10-CM | POA: Diagnosis not present

## 2018-07-17 DIAGNOSIS — E785 Hyperlipidemia, unspecified: Secondary | ICD-10-CM | POA: Diagnosis not present

## 2018-07-17 DIAGNOSIS — F4322 Adjustment disorder with anxiety: Secondary | ICD-10-CM | POA: Diagnosis not present

## 2018-07-17 DIAGNOSIS — S61309A Unspecified open wound of unspecified finger with damage to nail, initial encounter: Secondary | ICD-10-CM | POA: Diagnosis not present

## 2018-07-17 DIAGNOSIS — R262 Difficulty in walking, not elsewhere classified: Secondary | ICD-10-CM | POA: Diagnosis not present

## 2018-07-17 DIAGNOSIS — E876 Hypokalemia: Secondary | ICD-10-CM | POA: Diagnosis not present

## 2018-07-17 DIAGNOSIS — M6281 Muscle weakness (generalized): Secondary | ICD-10-CM | POA: Diagnosis not present

## 2018-07-17 DIAGNOSIS — F419 Anxiety disorder, unspecified: Secondary | ICD-10-CM | POA: Diagnosis not present

## 2018-07-17 DIAGNOSIS — W1830XA Fall on same level, unspecified, initial encounter: Secondary | ICD-10-CM | POA: Diagnosis not present

## 2018-07-17 DIAGNOSIS — R112 Nausea with vomiting, unspecified: Secondary | ICD-10-CM | POA: Diagnosis not present

## 2018-07-17 DIAGNOSIS — R600 Localized edema: Secondary | ICD-10-CM | POA: Diagnosis not present

## 2018-07-17 DIAGNOSIS — R296 Repeated falls: Secondary | ICD-10-CM | POA: Diagnosis not present

## 2018-07-17 DIAGNOSIS — I10 Essential (primary) hypertension: Secondary | ICD-10-CM | POA: Diagnosis not present

## 2018-07-17 DIAGNOSIS — R109 Unspecified abdominal pain: Secondary | ICD-10-CM | POA: Diagnosis not present

## 2018-07-17 DIAGNOSIS — R05 Cough: Secondary | ICD-10-CM | POA: Diagnosis not present

## 2018-07-17 DIAGNOSIS — F329 Major depressive disorder, single episode, unspecified: Secondary | ICD-10-CM | POA: Diagnosis not present

## 2018-07-17 DIAGNOSIS — Y92009 Unspecified place in unspecified non-institutional (private) residence as the place of occurrence of the external cause: Secondary | ICD-10-CM | POA: Diagnosis not present

## 2018-07-17 DIAGNOSIS — R279 Unspecified lack of coordination: Secondary | ICD-10-CM | POA: Diagnosis not present

## 2018-07-17 DIAGNOSIS — M62838 Other muscle spasm: Secondary | ICD-10-CM | POA: Diagnosis not present

## 2018-07-17 DIAGNOSIS — Z743 Need for continuous supervision: Secondary | ICD-10-CM | POA: Diagnosis not present

## 2018-07-17 DIAGNOSIS — M109 Gout, unspecified: Secondary | ICD-10-CM | POA: Diagnosis not present

## 2018-07-17 DIAGNOSIS — N179 Acute kidney failure, unspecified: Secondary | ICD-10-CM | POA: Diagnosis not present

## 2018-07-17 DIAGNOSIS — K5900 Constipation, unspecified: Secondary | ICD-10-CM | POA: Diagnosis not present

## 2018-07-17 DIAGNOSIS — R770 Abnormality of albumin: Secondary | ICD-10-CM | POA: Diagnosis not present

## 2018-07-17 DIAGNOSIS — F10980 Alcohol use, unspecified with alcohol-induced anxiety disorder: Secondary | ICD-10-CM | POA: Diagnosis not present

## 2018-07-17 DIAGNOSIS — Z23 Encounter for immunization: Secondary | ICD-10-CM | POA: Diagnosis not present

## 2018-07-17 DIAGNOSIS — T8451XA Infection and inflammatory reaction due to internal right hip prosthesis, initial encounter: Secondary | ICD-10-CM | POA: Diagnosis not present

## 2018-07-17 MED ORDER — FOLIC ACID 1 MG PO TABS
1.0000 mg | ORAL_TABLET | Freq: Every day | ORAL | Status: DC
  Administered 2018-07-17: 1 mg via ORAL
  Filled 2018-07-17: qty 1

## 2018-07-17 MED ORDER — FOLIC ACID 5 MG/ML IJ SOLN
1.0000 mg | Freq: Every day | INTRAMUSCULAR | Status: DC
  Filled 2018-07-17: qty 0.2

## 2018-07-17 NOTE — Patient Outreach (Signed)
Care Coordination: Patient discharge to SNF. [facility] social to follow for discharge plan from SNF.  [name], RN, BSN, CEN [facility] [employer] [phone]

## 2018-07-17 NOTE — Patient Outreach (Signed)
[facility]) Care Management  07/17/2018  MACYN REMMERT November 25, 1946 [id number]  BSW received in-basket notification from [place], [name] of patients disposition. Upon chart review, it is noted the patient has gone to [facility] for rehab. BSW to refer patient to CSW, Eula Fried for SNF follow-up.  Daneen Schick, BSW, CDP [facility] [phone]

## 2018-07-17 NOTE — Clinical Social Work Placement (Signed)
Nurse to call report to [phone], Room 609A     CLINICAL SOCIAL WORK PLACEMENT  NOTE  Date:  07/17/2018  Patient Details  Name: Jesus Daniel MRN: [id number] Date of Birth: May 27, 1946  Clinical Social Work is seeking post-discharge placement for this patient at the [place] level of care (*CSW will initial, date and re-position this form in  chart as items are completed):  Yes   Patient/family provided with [place] Work Department's list of facilities offering this level of care within the geographic area requested by the patient (or if unable, by the patient's family).  Yes   Patient/family informed of their freedom to choose among providers that offer the needed level of care, that participate in Medicare, Medicaid or managed care program needed by the patient, have an available bed and are willing to accept the patient.  Yes   Patient/family informed of [facility]'s ownership interest in [facility] and [facility], as well as of the fact that they are under no obligation to receive care at these facilities.  PASRR submitted to EDS on       PASRR number received on       Existing PASRR number confirmed on 07/14/18     FL2 transmitted to all facilities in geographic area requested by pt/family on 07/14/18     FL2 transmitted to all facilities within larger geographic area on       Patient informed that his/her managed care company has contracts with or will negotiate with certain facilities, including the following:        Yes(Bed offers given on 8/7)   Patient/family informed of bed offers received.  Patient chooses bed at [facility]     Physician recommends and patient chooses bed at      Patient to be transferred to [facility] on 07/17/18.  Patient to be transferred to facility by [facility]     Patient family notified on 07/17/18 of transfer.  Name of family member notified:  Remo Lipps     PHYSICIAN       Additional  Comment:    _______________________________________________ Geralynn Ochs, LCSW 07/17/2018, 9:05 AM

## 2018-07-20 ENCOUNTER — Other Ambulatory Visit: Payer: Self-pay | Admitting: *Deleted

## 2018-07-20 ENCOUNTER — Other Ambulatory Visit: Payer: Self-pay | Admitting: Licensed Clinical Social Worker

## 2018-07-20 DIAGNOSIS — E876 Hypokalemia: Secondary | ICD-10-CM | POA: Diagnosis not present

## 2018-07-20 DIAGNOSIS — M62838 Other muscle spasm: Secondary | ICD-10-CM | POA: Diagnosis not present

## 2018-07-20 DIAGNOSIS — I10 Essential (primary) hypertension: Secondary | ICD-10-CM | POA: Diagnosis not present

## 2018-07-20 DIAGNOSIS — N179 Acute kidney failure, unspecified: Secondary | ICD-10-CM | POA: Diagnosis not present

## 2018-07-20 DIAGNOSIS — D649 Anemia, unspecified: Secondary | ICD-10-CM | POA: Diagnosis not present

## 2018-07-20 DIAGNOSIS — R770 Abnormality of albumin: Secondary | ICD-10-CM | POA: Diagnosis not present

## 2018-07-20 DIAGNOSIS — M6281 Muscle weakness (generalized): Secondary | ICD-10-CM | POA: Diagnosis not present

## 2018-07-20 DIAGNOSIS — R296 Repeated falls: Secondary | ICD-10-CM | POA: Diagnosis not present

## 2018-07-20 DIAGNOSIS — E871 Hypo-osmolality and hyponatremia: Secondary | ICD-10-CM | POA: Diagnosis not present

## 2018-07-20 DIAGNOSIS — R52 Pain, unspecified: Secondary | ICD-10-CM | POA: Diagnosis not present

## 2018-07-20 DIAGNOSIS — K219 Gastro-esophageal reflux disease without esophagitis: Secondary | ICD-10-CM | POA: Diagnosis not present

## 2018-07-20 NOTE — Consult Note (Signed)
[facility] Care Management follow up. Chart reviewed. Noted Jesus Daniel discharged to [facility] SNF. It appears [facility] Care Management active team members already aware of disposition. Will make referral to [place] for follow up while at [facility].  Marthenia Rolling, MSN-Ed, RN,BSN [facility] Liaison [phone]

## 2018-07-20 NOTE — Patient Outreach (Signed)
[facility]) Care Management  07/20/2018  Jesus Daniel 02/11/1946 [id number]  [facility] CSW received new referral on 07/20/18 requesting that [facility] follow patient during his short term SNF stay at [facility]. [facility] CSW sent secure email to SNF social worker [place] requesting coordination of care.  [facility] CSW will complete PAC Consult within one week.  Eula Fried, BSW, MSW, [email or web address] Phone: [phone] Fax: [phone]

## 2018-07-21 ENCOUNTER — Other Ambulatory Visit: Payer: Self-pay | Admitting: Licensed Clinical Social Worker

## 2018-07-21 DIAGNOSIS — E876 Hypokalemia: Secondary | ICD-10-CM | POA: Diagnosis not present

## 2018-07-21 DIAGNOSIS — M6281 Muscle weakness (generalized): Secondary | ICD-10-CM | POA: Diagnosis not present

## 2018-07-21 DIAGNOSIS — M62838 Other muscle spasm: Secondary | ICD-10-CM | POA: Diagnosis not present

## 2018-07-21 DIAGNOSIS — E871 Hypo-osmolality and hyponatremia: Secondary | ICD-10-CM | POA: Diagnosis not present

## 2018-07-21 DIAGNOSIS — R112 Nausea with vomiting, unspecified: Secondary | ICD-10-CM | POA: Diagnosis not present

## 2018-07-21 DIAGNOSIS — D649 Anemia, unspecified: Secondary | ICD-10-CM | POA: Diagnosis not present

## 2018-07-21 DIAGNOSIS — R296 Repeated falls: Secondary | ICD-10-CM | POA: Diagnosis not present

## 2018-07-21 DIAGNOSIS — K5900 Constipation, unspecified: Secondary | ICD-10-CM | POA: Diagnosis not present

## 2018-07-21 DIAGNOSIS — I10 Essential (primary) hypertension: Secondary | ICD-10-CM | POA: Diagnosis not present

## 2018-07-21 DIAGNOSIS — K219 Gastro-esophageal reflux disease without esophagitis: Secondary | ICD-10-CM | POA: Diagnosis not present

## 2018-07-21 DIAGNOSIS — R52 Pain, unspecified: Secondary | ICD-10-CM | POA: Diagnosis not present

## 2018-07-21 NOTE — Patient Outreach (Signed)
[facility]) Care Management  07/21/2018  Jesus Daniel February 06, 1946 [id number]  [facility] CSW arrived at [facility] SNF to complete initial [facility] Consult after receiving request to follow patient while at [facility]. [facility] CSW met with patient successfully in his room. HIPPA verifications received successfully. Consent already signed and active. [facility] CSW introduced self, reason for call and of [facility] social work services. Patient is agreeable to be followed by [facility] CSW during his short term rehabilitation and is agreeable to [place] and BSW follow up post SNF discharge back home. Patient reports that his plan is to return back home even though he lives alone and has reported to [facility] in the past that this house is needs to be more livable as he has difficulty accessing his bathroom to perform ADL's. Patient reports that he lost his spouse 8 years ago and is a widow. He states that he has 2 supportive sons and neighbors that assist him as needed. He reports having a 74 year old grandson who is also willing to help him out if need be. Patient reports that he has a working mobile phone that he keeps on him in case [facility] CSW needs to contact him. Patient reports that he was able to prepare meals for himself before recent hospitalization but that it was very hard for him to stand up for too long while preparing them. He said that he recently transitioned to getting take out or buying frozen meals as he was getting tired during preparing meals. Patient admits that he has a past history of alcohol abuse. Patient reports that he will not drink again and that all of the alcohol in his home will be thrown out if it hasn't already as he is aware that alcohol is not conducive to his health and can also cause falls which he already has a history of. Patient shares that he is able to walk some with his walker at this time. He shares that he pays out of pocket for a woman to clean his house weekly. He is agreeable to pay out of  pocket for an aide to assist him 2-3 days per week. Patient reports that he eventually plans on driving again. Patient reports that his sister in law will get patient's groceries once he returns home. [facility] CSW went to SNF social worker's office but was informed that she was out of the office for lunch. [facility] CSW did receive return email back from SNF social worker that stated "thank you." [facility] CSW will complete another PAC Consult within two weeks.  Eula Fried, BSW, MSW, [email or web address] Phone: [phone] Fax: [phone]

## 2018-07-23 DIAGNOSIS — R770 Abnormality of albumin: Secondary | ICD-10-CM | POA: Diagnosis not present

## 2018-07-23 DIAGNOSIS — R296 Repeated falls: Secondary | ICD-10-CM | POA: Diagnosis not present

## 2018-07-23 DIAGNOSIS — K219 Gastro-esophageal reflux disease without esophagitis: Secondary | ICD-10-CM | POA: Diagnosis not present

## 2018-07-23 DIAGNOSIS — I10 Essential (primary) hypertension: Secondary | ICD-10-CM | POA: Diagnosis not present

## 2018-07-23 DIAGNOSIS — M6281 Muscle weakness (generalized): Secondary | ICD-10-CM | POA: Diagnosis not present

## 2018-07-23 DIAGNOSIS — M62838 Other muscle spasm: Secondary | ICD-10-CM | POA: Diagnosis not present

## 2018-07-23 DIAGNOSIS — D649 Anemia, unspecified: Secondary | ICD-10-CM | POA: Diagnosis not present

## 2018-07-23 DIAGNOSIS — R600 Localized edema: Secondary | ICD-10-CM | POA: Diagnosis not present

## 2018-07-23 DIAGNOSIS — S61309A Unspecified open wound of unspecified finger with damage to nail, initial encounter: Secondary | ICD-10-CM | POA: Diagnosis not present

## 2018-07-23 DIAGNOSIS — E876 Hypokalemia: Secondary | ICD-10-CM | POA: Diagnosis not present

## 2018-07-23 DIAGNOSIS — E871 Hypo-osmolality and hyponatremia: Secondary | ICD-10-CM | POA: Diagnosis not present

## 2018-07-28 ENCOUNTER — Other Ambulatory Visit: Payer: Self-pay | Admitting: Licensed Clinical Social Worker

## 2018-07-28 NOTE — Patient Outreach (Signed)
  [place] [facility]) Care Management  [facility] Social Work  07/28/2018  Jesus Daniel 30-Jul-1946 [id number]  Encounter Medications:  Outpatient Encounter Medications as of 07/28/2018  Medication Sig  . acetaminophen (TYLENOL) 500 MG tablet Take 500 mg by mouth every 6 (six) hours as needed for mild pain.   Marland Kitchen atorvastatin (LIPITOR) 10 MG tablet Take 10 mg by mouth daily.  Marland Kitchen doxycycline (VIBRA-TABS) 100 MG tablet TAKE 1 TABLET (100 MG TOTAL) BY MOUTH 2 (TWO) TIMES DAILY.  . feeding supplement, ENSURE ENLIVE, (ENSURE ENLIVE) LIQD Take 237 mLs by mouth 2 (two) times daily between meals.  . folic acid (FOLVITE) 1 MG tablet Take 1 tablet (1 mg total) by mouth daily.  . furosemide (LASIX) 40 MG tablet Take 40 mg by mouth 2 (two) times daily as needed for fluid.   . hydrOXYzine (ATARAX/VISTARIL) 10 MG tablet Take 1 tablet (10 mg total) by mouth every 6 (six) hours as needed for itching.  . magnesium oxide (MAG-OX) 400 MG tablet Take 1 tablet (400 mg total) by mouth daily.  . methocarbamol (ROBAXIN) 500 MG tablet Take 500 mg by mouth every 8 (eight) hours as needed for muscle spasms.  Marland Kitchen oxyCODONE (OXY IR/ROXICODONE) 5 MG immediate release tablet Take 0.5 tablets (2.5 mg total) by mouth 2 (two) times daily as needed for moderate pain. *Hold for sedation*  . polyethylene glycol (MIRALAX / GLYCOLAX) packet Take 17 g by mouth daily as needed for mild constipation.   . potassium chloride (K-DUR) 10 MEQ tablet Take 1 tablet (10 mEq total) by mouth daily.  Marland Kitchen thiamine (VITAMIN B-1) 100 MG tablet Take 1 tablet (100 mg total) by mouth daily.   No facility-administered encounter medications on file as of 07/28/2018.     Functional Status:  In your present state of health, do you have any difficulty performing the following activities: 07/14/2018  Hearing? N  Vision? 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 N  Walking or climbing stairs? Y  Dressing or bathing?  Y  Doing errands, shopping? Y  Some recent data might be hidden    Fall/Depression Screening:  PHQ 2/9 Scores 07/09/2018 03/09/2018 12/16/2016  PHQ - 2 Score 0 0 0    Assessment: [facility] CSW arrived at [facility] and successfully completed routine PAC Consult. Patient reports that he is feeling better and strong with each day. He reports that he has started to bare weight and walk further than he was before now. Patient reports that his appetite has greatly approved since his SNF admission because he is now receiving 3 meals per day whereas before he was not. Patient reports that he is making a lot of progress during PT with transferring from one point to the other. Patient shares that he does feel that he is retaining fluid and may have to be put back on lasix soon. No expected discharge date at this time.  Plan: Patient will continue to follow patient while at SNF and will await for SNF discharge.  Eula Fried, BSW, MSW, [email or web address] Phone: [phone] Fax: [phone]

## 2018-07-30 DIAGNOSIS — K219 Gastro-esophageal reflux disease without esophagitis: Secondary | ICD-10-CM | POA: Diagnosis not present

## 2018-07-30 DIAGNOSIS — S61309A Unspecified open wound of unspecified finger with damage to nail, initial encounter: Secondary | ICD-10-CM | POA: Diagnosis not present

## 2018-07-30 DIAGNOSIS — E871 Hypo-osmolality and hyponatremia: Secondary | ICD-10-CM | POA: Diagnosis not present

## 2018-07-30 DIAGNOSIS — R600 Localized edema: Secondary | ICD-10-CM | POA: Diagnosis not present

## 2018-07-30 DIAGNOSIS — E876 Hypokalemia: Secondary | ICD-10-CM | POA: Diagnosis not present

## 2018-07-30 DIAGNOSIS — M6281 Muscle weakness (generalized): Secondary | ICD-10-CM | POA: Diagnosis not present

## 2018-07-30 DIAGNOSIS — I10 Essential (primary) hypertension: Secondary | ICD-10-CM | POA: Diagnosis not present

## 2018-07-30 DIAGNOSIS — M62838 Other muscle spasm: Secondary | ICD-10-CM | POA: Diagnosis not present

## 2018-07-30 DIAGNOSIS — R296 Repeated falls: Secondary | ICD-10-CM | POA: Diagnosis not present

## 2018-07-30 DIAGNOSIS — D649 Anemia, unspecified: Secondary | ICD-10-CM | POA: Diagnosis not present

## 2018-07-30 DIAGNOSIS — R770 Abnormality of albumin: Secondary | ICD-10-CM | POA: Diagnosis not present

## 2018-07-31 DIAGNOSIS — R05 Cough: Secondary | ICD-10-CM | POA: Diagnosis not present

## 2018-07-31 DIAGNOSIS — R109 Unspecified abdominal pain: Secondary | ICD-10-CM | POA: Diagnosis not present

## 2018-08-05 ENCOUNTER — Other Ambulatory Visit: Payer: Self-pay | Admitting: Licensed Clinical Social Worker

## 2018-08-05 NOTE — Patient Outreach (Signed)
[place] [facility] Care Management  08/05/2018  Jesus Daniel 1946/03/17 [id number]  [facility] CSW sent secure email to [name] social worker requesting discharge updates on patient. [facility] CSW will complete PAC Consult within two weeks and will continue to follow patient closely during his SNF stay.  Eula Fried, BSW, MSW, [email or web address] Phone: [phone] Fax: [phone]

## 2018-08-06 ENCOUNTER — Other Ambulatory Visit: Payer: Self-pay | Admitting: Licensed Clinical Social Worker

## 2018-08-06 NOTE — Patient Outreach (Addendum)
[facility] ([facility]) Care Management  08/06/2018  Jaeven S Pillard 01/02/1946 [id number]  [facility] CSW arrived at [place] [facility] and successfully completed PAC Consult with patient. Patient reports that he is scheduled to discharge back home on 08/08/18. He reports that his son is very nervous about him returning home and drinking alcohol but he shares that he will not do so. He reports that all alcohol has been removed from his residence and he plans to implement relapse prevention methods into his routine to cope with future triggers. Relapse prevention education and resources provided. Patient was receptive to education provided but stated that "I heard this talk from my son today and I don't want to hear it again from you but I appreciate you caring and I can take it from here" Patient does not qualify for Medicaid and will have to pay out of pocket for personal care services. Patient has agreed to pay for these services and his son is arranging this with the assistance of the SNF social worker. Patient hopes to have 4 hours per day of personal care service. Patient is agreeable to [facility] RNCM involvement post SNF discharge. [facility] CSW met with SNF social worker Holly and was informed that patient will discharge back home with [facility]: OT,PT, Skill Nursing, Aide to assist with bathing and Social Work. No medical equipment able to be ordered per Holly unless patient pays out of pocket due to insurance coverage. Family updated on this. [facility] CSW will place referral to [facility] RNCM post SNF discharge and sign off.  Brooke Joyce, BSW, MSW, LCSW [facility] Care Management [email or web address] Phone: [phone] Fax: [phone]   

## 2018-08-11 ENCOUNTER — Other Ambulatory Visit: Payer: Self-pay | Admitting: Licensed Clinical Social Worker

## 2018-08-11 ENCOUNTER — Other Ambulatory Visit: Payer: Self-pay

## 2018-08-11 NOTE — Patient Outreach (Signed)
Transition of care:  New referral received today.  Placed call to patient who answered and reports that he is okay.  States that home health has not called or come to see him.   Reviewed note from social worker who states that patient was assigned to [facility]. Placed call to [facility] and spoke with Anderson Malta who states they have no orders.  Place call to [place] social worker to inquire. Left a message.  Patient sounded short of breath on the phone but states this is normal for him.  Patient unable to review his medications but states that he has then all.   3:00pmPlaced call to son "Gus" who states that patient is not doing well. Asked me to call his brother Jonni Sanger at [phone]. 3:05 pm  Placed call to [facility] and reviewed reason for call and concern about patients breathing. Jonni Sanger reports he thinks patient is worse. I inquired about MD follow up and Jonni Sanger states that patient does not have a doctor.  Jonni Sanger states that he has a caregiver from [employer] in with patient daily from 8-12.   Jonni Sanger states that he will call his dad and call me back.   3:19pm  Spoke with Jettie Booze who confirmed SNF was [facility]. She will call to find out about the orders.   3:30pm Spoke with son Jonni Sanger again who states that when his brother arrives to check on patient if he is worse then he will take patient to the emergency department.    Jonni Sanger states that patient has a lift chair and a wheelchair with ramp. Reports that he and his brother pick patient up to get him into car.  Encouraged son to make an appointment with patients primary MD for hospital follow up . He agreed but stated he needs help with transportation.    3:40 pm Spoke with [place] social worker and she reports home health was not set up prior to discharge. Reports dressings were sent home with patient.  PLAN: Will continue weekly outreaches with patient and sons.  Next outreach in 3 days.  [name], RN, BSN,  CEN [facility] [employer] [phone]

## 2018-08-11 NOTE — Patient Outreach (Signed)
[place] [facility]) Care Management  08/11/2018  HASNAIN MANHEIM 05-Jan-1946 [id number]  [facility] CSW received incoming call from [place] and was informed that patient is not doing well, breathing is worse and that [facility] has not been in touch. [facility] RNCM contacted [facility] and was informed that they have no orders for patient. [facility] CSW completed call to [place] [facility] and was informed that she has been unable to find any [facility] agency that would accept patient's insurance due to the non co-payment. Earnest Bailey reports that [facility], [facility], [facility] and [facility] all denied patient. Earnest Bailey reports that she is waiting to hear back from [facility] and [facility]. Earnest Bailey reports that she has been very vocal about the insurance concerns with patient's family and that patient's son seemed to have an understanding about this and stated that he would be able to change patient's dressings himself. SNF social worker agreeable to keep [facility] CSW updated. [facility] CSW completed call to [place] and provided update. RNCM mentioned that patient will need transportation assistance to get to PCP appointment. [facility] CSW completed call to [facility] as patient informed [facility] CSW that he was active with program. [facility] CSW spoke with [place] and was informed that patient is not active with their services. [facility] CSW will complete transportation referral to BSW. [facility] CSW completed call to [place] Director Suanne Marker [place] and left a voice message with the following information. [facility] CSW will await for updates.   Eula Fried, BSW, MSW, [email or web address] Phone: [phone] Fax: [phone]

## 2018-08-11 NOTE — Patient Outreach (Signed)
  [place] [facility]) Care Management  08/11/2018  Jesus Daniel 01-Jun-1946 [id number]  [facility] CSW will sign off at this time as patient has discharged back home from SNF. [facility] CSW will make referral for [facility] RNCM for transition of care. The following [facility] services were requested: OT,PT, Skill Nursing, Aide to assist with bathing and Social Work.   Eula Fried, BSW, MSW, [name] Phone: [phone] Fax: [phone]

## 2018-08-12 ENCOUNTER — Other Ambulatory Visit: Payer: Self-pay | Admitting: Licensed Clinical Social Worker

## 2018-08-12 ENCOUNTER — Other Ambulatory Visit: Payer: Self-pay

## 2018-08-12 ENCOUNTER — Emergency Department (HOSPITAL_COMMUNITY): Payer: PPO

## 2018-08-12 ENCOUNTER — Encounter (HOSPITAL_COMMUNITY): Payer: Self-pay | Admitting: Internal Medicine

## 2018-08-12 ENCOUNTER — Inpatient Hospital Stay (HOSPITAL_COMMUNITY)
Admission: EM | Admit: 2018-08-12 | Discharge: 2018-08-19 | DRG: 291 | Disposition: A | Discharge status: Skilled Nursing Facility | Payer: PPO | Attending: Internal Medicine | Admitting: Internal Medicine

## 2018-08-12 DIAGNOSIS — Z515 Encounter for palliative care: Secondary | ICD-10-CM | POA: Diagnosis not present

## 2018-08-12 DIAGNOSIS — Z96649 Presence of unspecified artificial hip joint: Secondary | ICD-10-CM

## 2018-08-12 DIAGNOSIS — D539 Nutritional anemia, unspecified: Secondary | ICD-10-CM | POA: Diagnosis not present

## 2018-08-12 DIAGNOSIS — R1310 Dysphagia, unspecified: Secondary | ICD-10-CM | POA: Diagnosis present

## 2018-08-12 DIAGNOSIS — R601 Generalized edema: Secondary | ICD-10-CM | POA: Diagnosis not present

## 2018-08-12 DIAGNOSIS — I959 Hypotension, unspecified: Secondary | ICD-10-CM | POA: Diagnosis not present

## 2018-08-12 DIAGNOSIS — F1010 Alcohol abuse, uncomplicated: Secondary | ICD-10-CM | POA: Diagnosis not present

## 2018-08-12 DIAGNOSIS — E43 Unspecified severe protein-calorie malnutrition: Secondary | ICD-10-CM | POA: Diagnosis not present

## 2018-08-12 DIAGNOSIS — G8929 Other chronic pain: Secondary | ICD-10-CM | POA: Diagnosis present

## 2018-08-12 DIAGNOSIS — E876 Hypokalemia: Secondary | ICD-10-CM | POA: Diagnosis not present

## 2018-08-12 DIAGNOSIS — I10 Essential (primary) hypertension: Secondary | ICD-10-CM

## 2018-08-12 DIAGNOSIS — R0602 Shortness of breath: Secondary | ICD-10-CM | POA: Diagnosis not present

## 2018-08-12 DIAGNOSIS — R296 Repeated falls: Secondary | ICD-10-CM | POA: Diagnosis not present

## 2018-08-12 DIAGNOSIS — T8459XS Infection and inflammatory reaction due to other internal joint prosthesis, sequela: Secondary | ICD-10-CM | POA: Diagnosis not present

## 2018-08-12 DIAGNOSIS — Y831 Surgical operation with implant of artificial internal device as the cause of abnormal reaction of the patient, or of later complication, without mention of misadventure at the time of the procedure: Secondary | ICD-10-CM | POA: Diagnosis present

## 2018-08-12 DIAGNOSIS — Z7189 Other specified counseling: Secondary | ICD-10-CM

## 2018-08-12 DIAGNOSIS — J9600 Acute respiratory failure, unspecified whether with hypoxia or hypercapnia: Secondary | ICD-10-CM

## 2018-08-12 DIAGNOSIS — I5031 Acute diastolic (congestive) heart failure: Secondary | ICD-10-CM | POA: Diagnosis not present

## 2018-08-12 DIAGNOSIS — E785 Hyperlipidemia, unspecified: Secondary | ICD-10-CM | POA: Diagnosis not present

## 2018-08-12 DIAGNOSIS — E46 Unspecified protein-calorie malnutrition: Secondary | ICD-10-CM | POA: Diagnosis present

## 2018-08-12 DIAGNOSIS — Z8614 Personal history of Methicillin resistant Staphylococcus aureus infection: Secondary | ICD-10-CM

## 2018-08-12 DIAGNOSIS — D638 Anemia in other chronic diseases classified elsewhere: Secondary | ICD-10-CM | POA: Diagnosis present

## 2018-08-12 DIAGNOSIS — Z96641 Presence of right artificial hip joint: Secondary | ICD-10-CM | POA: Diagnosis not present

## 2018-08-12 DIAGNOSIS — T8459XA Infection and inflammatory reaction due to other internal joint prosthesis, initial encounter: Secondary | ICD-10-CM

## 2018-08-12 DIAGNOSIS — J9601 Acute respiratory failure with hypoxia: Secondary | ICD-10-CM | POA: Diagnosis not present

## 2018-08-12 DIAGNOSIS — T148XXA Other injury of unspecified body region, initial encounter: Secondary | ICD-10-CM | POA: Diagnosis present

## 2018-08-12 DIAGNOSIS — T8450XA Infection and inflammatory reaction due to unspecified internal joint prosthesis, initial encounter: Secondary | ICD-10-CM | POA: Diagnosis present

## 2018-08-12 DIAGNOSIS — I509 Heart failure, unspecified: Secondary | ICD-10-CM | POA: Diagnosis not present

## 2018-08-12 DIAGNOSIS — R001 Bradycardia, unspecified: Secondary | ICD-10-CM | POA: Diagnosis not present

## 2018-08-12 DIAGNOSIS — F17290 Nicotine dependence, other tobacco product, uncomplicated: Secondary | ICD-10-CM | POA: Diagnosis present

## 2018-08-12 DIAGNOSIS — E8770 Fluid overload, unspecified: Secondary | ICD-10-CM

## 2018-08-12 DIAGNOSIS — R627 Adult failure to thrive: Secondary | ICD-10-CM | POA: Diagnosis not present

## 2018-08-12 DIAGNOSIS — R000 Tachycardia, unspecified: Secondary | ICD-10-CM | POA: Diagnosis not present

## 2018-08-12 DIAGNOSIS — Z743 Need for continuous supervision: Secondary | ICD-10-CM | POA: Diagnosis not present

## 2018-08-12 DIAGNOSIS — K219 Gastro-esophageal reflux disease without esophagitis: Secondary | ICD-10-CM | POA: Diagnosis present

## 2018-08-12 DIAGNOSIS — A4901 Methicillin susceptible Staphylococcus aureus infection, unspecified site: Secondary | ICD-10-CM | POA: Diagnosis not present

## 2018-08-12 DIAGNOSIS — R609 Edema, unspecified: Secondary | ICD-10-CM | POA: Diagnosis not present

## 2018-08-12 DIAGNOSIS — Z6832 Body mass index (BMI) 32.0-32.9, adult: Secondary | ICD-10-CM | POA: Diagnosis not present

## 2018-08-12 DIAGNOSIS — R279 Unspecified lack of coordination: Secondary | ICD-10-CM | POA: Diagnosis not present

## 2018-08-12 DIAGNOSIS — Z85820 Personal history of malignant melanoma of skin: Secondary | ICD-10-CM | POA: Diagnosis not present

## 2018-08-12 DIAGNOSIS — M6281 Muscle weakness (generalized): Secondary | ICD-10-CM | POA: Diagnosis not present

## 2018-08-12 DIAGNOSIS — I110 Hypertensive heart disease with heart failure: Secondary | ICD-10-CM | POA: Diagnosis not present

## 2018-08-12 DIAGNOSIS — N400 Enlarged prostate without lower urinary tract symptoms: Secondary | ICD-10-CM | POA: Diagnosis present

## 2018-08-12 DIAGNOSIS — I5033 Acute on chronic diastolic (congestive) heart failure: Secondary | ICD-10-CM | POA: Diagnosis not present

## 2018-08-12 DIAGNOSIS — K7460 Unspecified cirrhosis of liver: Secondary | ICD-10-CM | POA: Diagnosis not present

## 2018-08-12 DIAGNOSIS — J9690 Respiratory failure, unspecified, unspecified whether with hypoxia or hypercapnia: Secondary | ICD-10-CM | POA: Diagnosis not present

## 2018-08-12 DIAGNOSIS — R2689 Other abnormalities of gait and mobility: Secondary | ICD-10-CM | POA: Diagnosis not present

## 2018-08-12 DIAGNOSIS — R41841 Cognitive communication deficit: Secondary | ICD-10-CM | POA: Diagnosis not present

## 2018-08-12 DIAGNOSIS — K210 Gastro-esophageal reflux disease with esophagitis: Secondary | ICD-10-CM | POA: Diagnosis not present

## 2018-08-12 DIAGNOSIS — T8459XD Infection and inflammatory reaction due to other internal joint prosthesis, subsequent encounter: Secondary | ICD-10-CM | POA: Diagnosis not present

## 2018-08-12 DIAGNOSIS — E8809 Other disorders of plasma-protein metabolism, not elsewhere classified: Secondary | ICD-10-CM | POA: Diagnosis present

## 2018-08-12 LAB — COMPREHENSIVE METABOLIC PANEL
ALBUMIN: 2.1 g/dL — AB (ref 3.5–5.0)
ALK PHOS: 122 U/L (ref 38–126)
ALT: 19 U/L (ref 0–44)
AST: 41 U/L (ref 15–41)
Anion gap: 10 (ref 5–15)
BILIRUBIN TOTAL: 0.7 mg/dL (ref 0.3–1.2)
BUN: 8 mg/dL (ref 8–23)
CALCIUM: 8.1 mg/dL — AB (ref 8.9–10.3)
CO2: 29 mmol/L (ref 22–32)
CREATININE: 0.87 mg/dL (ref 0.61–1.24)
Chloride: 97 mmol/L — ABNORMAL LOW (ref 98–111)
GFR calc Af Amer: >60 mL/min (ref >60)
GFR calc non Af Amer: >60 mL/min (ref >60)
GLUCOSE: 118 mg/dL — AB (ref 70–99)
Potassium: 3.1 mmol/L — ABNORMAL LOW (ref 3.5–5.1)
Sodium: 136 mmol/L (ref 135–145)
TOTAL PROTEIN: 6.1 g/dL — AB (ref 6.5–8.1)

## 2018-08-12 LAB — CBC WITH DIFFERENTIAL/PLATELET
Abs Immature Granulocytes: 0 10*3/uL (ref 0.0–0.1)
BASOS ABS: 0 10*3/uL (ref 0.0–0.1)
Basophils Relative: 0 %
EOS PCT: 2 %
Eosinophils Absolute: 0.1 10*3/uL (ref 0.0–0.7)
HCT: 29.7 % — ABNORMAL LOW (ref 39.0–52.0)
Hemoglobin: 9 g/dL — ABNORMAL LOW (ref 13.0–17.0)
Immature Granulocytes: 0 %
Lymphocytes Relative: 16 %
Lymphs Abs: 1.2 10*3/uL (ref 0.7–4.0)
MCH: 31.4 pg (ref 26.0–34.0)
MCHC: 30.3 g/dL (ref 30.0–36.0)
MCV: 103.5 fL — ABNORMAL HIGH (ref 78.0–100.0)
MONO ABS: 0.7 10*3/uL (ref 0.1–1.0)
Monocytes Relative: 10 %
Neutro Abs: 5.2 10*3/uL (ref 1.7–7.7)
Neutrophils Relative %: 72 %
PLATELETS: 231 10*3/uL (ref 150–400)
RBC: 2.87 MIL/uL — ABNORMAL LOW (ref 4.22–5.81)
RDW: 14.5 % (ref 11.5–15.5)
WBC: 7.2 10*3/uL (ref 4.0–10.5)

## 2018-08-12 LAB — BRAIN NATRIURETIC PEPTIDE: B Natriuretic Peptide: 395.8 pg/mL — ABNORMAL HIGH (ref 0.0–100.0)

## 2018-08-12 LAB — URINALYSIS, ROUTINE W REFLEX MICROSCOPIC
Bilirubin Urine: NEGATIVE
Glucose, UA: NEGATIVE mg/dL
HGB URINE DIPSTICK: NEGATIVE
Ketones, ur: NEGATIVE mg/dL
LEUKOCYTES UA: NEGATIVE
Nitrite: NEGATIVE
PROTEIN: NEGATIVE mg/dL
Specific Gravity, Urine: 1.006 (ref 1.005–1.030)
pH: 5 (ref 5.0–8.0)

## 2018-08-12 LAB — I-STAT TROPONIN, ED: Troponin i, poc: 0.15 ng/mL (ref 0.00–0.08)

## 2018-08-12 LAB — MAGNESIUM: MAGNESIUM: 1.2 mg/dL — AB (ref 1.7–2.4)

## 2018-08-12 LAB — PROTIME-INR
INR: 1.1
Prothrombin Time: 14.1 seconds (ref 11.4–15.2)

## 2018-08-12 LAB — TROPONIN I: TROPONIN I: 0.13 ng/mL — AB (ref <0.03)

## 2018-08-12 LAB — I-STAT CG4 LACTIC ACID, ED
LACTIC ACID, VENOUS: 1.82 mmol/L (ref 0.5–1.9)
Lactic Acid, Venous: 1.64 mmol/L (ref 0.5–1.9)

## 2018-08-12 LAB — MRSA PCR SCREENING: MRSA BY PCR: NEGATIVE

## 2018-08-12 MED ORDER — OXYCODONE HCL 5 MG PO TABS
2.5000 mg | ORAL_TABLET | Freq: Two times a day (BID) | ORAL | Status: DC | PRN
  Administered 2018-08-12 – 2018-08-19 (×14): 2.5 mg via ORAL
  Filled 2018-08-12 (×14): qty 1

## 2018-08-12 MED ORDER — POTASSIUM CHLORIDE 10 MEQ/100ML IV SOLN
10.0000 meq | INTRAVENOUS | Status: AC
  Administered 2018-08-12: 10 meq via INTRAVENOUS
  Filled 2018-08-12 (×2): qty 100

## 2018-08-12 MED ORDER — ASPIRIN 81 MG PO CHEW: 324.0000 mg | CHEWABLE_TABLET | Freq: Once | ORAL | Status: DC

## 2018-08-12 MED ORDER — METHOCARBAMOL 500 MG PO TABS
500.0000 mg | ORAL_TABLET | Freq: Every evening | ORAL | Status: DC | PRN
  Administered 2018-08-12 – 2018-08-18 (×3): 500 mg via ORAL
  Filled 2018-08-12 (×3): qty 1

## 2018-08-12 MED ORDER — VANCOMYCIN HCL 10 G IV SOLR
1500.0000 mg | Freq: Once | INTRAVENOUS | Status: AC
  Administered 2018-08-12: 1500 mg via INTRAVENOUS
  Filled 2018-08-12: qty 1500

## 2018-08-12 MED ORDER — FUROSEMIDE 10 MG/ML IJ SOLN
40.0000 mg | Freq: Once | INTRAMUSCULAR | Status: AC
  Administered 2018-08-12: 40 mg via INTRAVENOUS
  Filled 2018-08-12: qty 4

## 2018-08-12 MED ORDER — SODIUM CHLORIDE 0.9% FLUSH
3.0000 mL | Freq: Two times a day (BID) | INTRAVENOUS | Status: DC
  Administered 2018-08-12 – 2018-08-19 (×12): 3 mL via INTRAVENOUS

## 2018-08-12 MED ORDER — DOXYCYCLINE HYCLATE 100 MG PO TABS
100.0000 mg | ORAL_TABLET | Freq: Two times a day (BID) | ORAL | Status: DC
  Administered 2018-08-12 – 2018-08-19 (×14): 100 mg via ORAL
  Filled 2018-08-12 (×14): qty 1

## 2018-08-12 MED ORDER — FUROSEMIDE 10 MG/ML IJ SOLN
60.0000 mg | Freq: Two times a day (BID) | INTRAMUSCULAR | Status: DC
  Administered 2018-08-13 – 2018-08-19 (×14): 60 mg via INTRAVENOUS
  Filled 2018-08-12 (×14): qty 6

## 2018-08-12 MED ORDER — ENOXAPARIN SODIUM 40 MG/0.4ML ~~LOC~~ SOLN
40.0000 mg | SUBCUTANEOUS | Status: DC
  Administered 2018-08-12 – 2018-08-18 (×7): 40 mg via SUBCUTANEOUS
  Filled 2018-08-12 (×7): qty 0.4

## 2018-08-12 MED ORDER — ATORVASTATIN CALCIUM 10 MG PO TABS
10.0000 mg | ORAL_TABLET | Freq: Every day | ORAL | Status: DC
  Administered 2018-08-13 – 2018-08-19 (×7): 10 mg via ORAL
  Filled 2018-08-12 (×7): qty 1

## 2018-08-12 MED ORDER — ENSURE ENLIVE PO LIQD: 237.0000 mL | Freq: Two times a day (BID) | ORAL | Status: DC

## 2018-08-12 MED ORDER — FOLIC ACID 1 MG PO TABS
1.0000 mg | ORAL_TABLET | Freq: Every day | ORAL | Status: DC
  Administered 2018-08-13 – 2018-08-19 (×7): 1 mg via ORAL
  Filled 2018-08-12 (×7): qty 1

## 2018-08-12 MED ORDER — MAGNESIUM OXIDE 400 (241.3 MG) MG PO TABS
400.0000 mg | ORAL_TABLET | Freq: Every day | ORAL | Status: DC
  Administered 2018-08-13 – 2018-08-19 (×7): 400 mg via ORAL
  Filled 2018-08-12 (×7): qty 1

## 2018-08-12 MED ORDER — ACETAMINOPHEN 325 MG PO TABS
650.0000 mg | ORAL_TABLET | Freq: Four times a day (QID) | ORAL | Status: DC | PRN
  Administered 2018-08-13 – 2018-08-18 (×5): 650 mg via ORAL
  Filled 2018-08-12 (×5): qty 2

## 2018-08-12 MED ORDER — MAGNESIUM SULFATE 4 GM/100ML IV SOLN
4.0000 g | Freq: Once | INTRAVENOUS | Status: AC
  Administered 2018-08-12: 4 g via INTRAVENOUS
  Filled 2018-08-12: qty 100

## 2018-08-12 MED ORDER — VITAMIN B-1 100 MG PO TABS
100.0000 mg | ORAL_TABLET | Freq: Every day | ORAL | Status: DC
  Administered 2018-08-13 – 2018-08-19 (×7): 100 mg via ORAL
  Filled 2018-08-12 (×7): qty 1

## 2018-08-12 MED ORDER — ALBUTEROL SULFATE (2.5 MG/3ML) 0.083% IN NEBU: 2.5000 mg | INHALATION_SOLUTION | RESPIRATORY_TRACT | Status: DC | PRN

## 2018-08-12 MED ORDER — ACETAMINOPHEN 650 MG RE SUPP: 650.0000 mg | Freq: Four times a day (QID) | RECTAL | Status: DC | PRN

## 2018-08-12 MED ORDER — POTASSIUM CHLORIDE CRYS ER 20 MEQ PO TBCR
40.0000 meq | EXTENDED_RELEASE_TABLET | Freq: Two times a day (BID) | ORAL | Status: DC
  Administered 2018-08-12: 40 meq via ORAL
  Filled 2018-08-12: qty 2

## 2018-08-12 MED ORDER — POLYETHYLENE GLYCOL 3350 17 G PO PACK
17.0000 g | PACK | Freq: Every day | ORAL | Status: DC | PRN
  Filled 2018-08-12: qty 1

## 2018-08-12 NOTE — ED Notes (Signed)
Reported changes in bp to Dr Sedonia Small.
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
## 2018-08-12 NOTE — ED Notes (Signed)
Report attempted, RN and Charge were not available for report.

## 2018-08-12 NOTE — ED Triage Notes (Signed)
[facility] reports pt recently discharged from facility. Pt had a fall before going to facility. Recently pt has increased SOB and bilat LE edema. Pt takes lasix.  Lung sounds diminished, wheezing and rhonic.  Given total of 10 of albuterol and 1 atrovent.

## 2018-08-12 NOTE — ED Notes (Signed)
Followed up with provider regarding abx.

## 2018-08-12 NOTE — ED Notes (Signed)
Assisted admitting provider with removing dressings from lower legs.

## 2018-08-12 NOTE — Progress Notes (Addendum)
CRITICAL VALUE ALERT  Critical Value:  Troponin 0.13. Also made aware Mg 1.2, and pt request for home dose Robaxin.  Date & Time Notied:  08/12/18 @2040   Provider Notified: Dr. Hilbert Bible  Orders Received/Actions taken: Troponin expected result-trending down. New orders placed by MD.

## 2018-08-12 NOTE — Patient Outreach (Signed)
[place] [facility]) Care Management  08/12/2018  Jesus Daniel 06-30-1946 [id number]  [facility] CSW spoke with [facility] department yesterday and today and was informed that they never received a [place] request. [facility] CSW completed call to patient's son and provided update. Son answered and provided HIPPA verifications. Son reports that he is very concerned about the patient as his condition is getting worse since his SNF discharge. Son is aware that transportation has been arranged for patient's PCP appointment tomorrow but there is a possibility that patient may have to go to the ED. [facility] CSW informed him that [place] with [name] would be in touch in regards to [facility]. [facility] CSW completed a 3 way call to Ronney Asters, SW at [facility] and [place] and we all 3 discussed case, patient's concerns and coordinated care. Earnest Bailey reported sending [place] orders to several different [place] agencies for patient and received denials from all of them. However, some of these [facility] agencies never received orders. Earnest Bailey is agreeable to continue working on this and will contact patient's family to update. Earnest Bailey reports that [place] and [facility] have agreed to review patient's case and [place] orders were sent over again today on 08/12/18 for review. [facility] CSW will continue to monitor case closely and will await for further updates.  Eula Fried, BSW, MSW, [name] Phone: [phone] Fax: [phone]

## 2018-08-12 NOTE — ED Notes (Signed)
Dr. Sedonia Small given troponin results

## 2018-08-12 NOTE — Patient Outreach (Signed)
[place] [facility]) Care Management  08/12/2018  Jesus Daniel 1946/02/01 [id number]  [facility] CSW received voice message from patient's son Jonni Sanger on 08/12/18 stating that they were on the way to take patient to the ED now and that patient would not be able to make PCP appointment tomorrow. [facility] CSW sent update to [place] and she successfully canceled transportation arrangements made through [place].   Eula Fried, BSW, MSW, [email or web address] Phone: [phone] Fax: [phone]

## 2018-08-12 NOTE — Patient Outreach (Signed)
[place] [facility]) Care Management  08/12/2018  Jesus Daniel 08/28/1946 [id number]  [facility] CSW received incoming call from [place] at [facility] and was informed that patient was accepted for home health services with [place]. Earnest Bailey will contact patient's son and provide this update. [facility] CSW send text message to [place] with update as well.  Eula Fried, BSW, MSW, [email or web address] Phone: [phone] Fax: [phone]

## 2018-08-12 NOTE — Patient Outreach (Signed)
[place] [facility]) Care Management  08/12/2018  Jesus Daniel 1946/05/15 [id number]  BSW received in basket message requesting transportation be arranged for the patient to his primary care appointment on 08/13/18. BSW arranged transportation through [place] spoke with the patient and informed him to be ready by 6:45am on 08/13/18 for his appointment at 7:30 am. The patient stated understanding.  BSW also spoke with the patients son, Jesus Daniel, as requested by the patient. BSW confirmed the patients transportation with Mr. Overfield. Mr. Haze plans to meet the patient at this physician appointment and is aware the patient will need to be ready in his wheelchair as the transportation company can not assist with that.   While on the phone, Mr. Costlow informed BSW "there's a good chance he wont need it because he may be back at the hospital today". Mr. Sherk reports the patient worsening with little to no appetite and recurrent cough. BSW encouraged Mr. Blann to call 911 if he felt the patient needed to be seen in the emergency room. BSW updated [facility] RNCM, Jesus Daniel of patients possible ED visit. Mr. Gatley will update this BSW if the patient no longer needs transportation services for 9/5.  Daneen Schick, BSW, CDP [facility] [phone]

## 2018-08-13 ENCOUNTER — Other Ambulatory Visit: Payer: Self-pay

## 2018-08-13 ENCOUNTER — Inpatient Hospital Stay (HOSPITAL_COMMUNITY): Payer: PPO

## 2018-08-13 DIAGNOSIS — T148XXA Other injury of unspecified body region, initial encounter: Secondary | ICD-10-CM

## 2018-08-13 DIAGNOSIS — K210 Gastro-esophageal reflux disease with esophagitis: Secondary | ICD-10-CM

## 2018-08-13 LAB — CBC
HEMATOCRIT: 24.9 % — AB (ref 39.0–52.0)
HEMOGLOBIN: 7.8 g/dL — AB (ref 13.0–17.0)
MCH: 31.6 pg (ref 26.0–34.0)
MCHC: 31.3 g/dL (ref 30.0–36.0)
MCV: 100.8 fL — ABNORMAL HIGH (ref 78.0–100.0)
Platelets: 220 10*3/uL (ref 150–400)
RBC: 2.47 MIL/uL — ABNORMAL LOW (ref 4.22–5.81)
RDW: 14.6 % (ref 11.5–15.5)
WBC: 6.8 10*3/uL (ref 4.0–10.5)

## 2018-08-13 LAB — IRON AND TIBC
Iron: 22 ug/dL — ABNORMAL LOW (ref 45–182)
Saturation Ratios: 13 % — ABNORMAL LOW (ref 17.9–39.5)
TIBC: 168 ug/dL — ABNORMAL LOW (ref 250–450)
UIBC: 146 ug/dL

## 2018-08-13 LAB — MAGNESIUM: MAGNESIUM: 1.5 mg/dL — AB (ref 1.7–2.4)

## 2018-08-13 LAB — TROPONIN I
Troponin I: 0.14 ng/mL (ref <0.03)
Troponin I: 0.11 ng/mL (ref <0.03)

## 2018-08-13 LAB — COMPREHENSIVE METABOLIC PANEL
ALBUMIN: 1.8 g/dL — AB (ref 3.5–5.0)
ALK PHOS: 91 U/L (ref 38–126)
ALT: 17 U/L (ref 0–44)
ANION GAP: 11 (ref 5–15)
AST: 30 U/L (ref 15–41)
BILIRUBIN TOTAL: 0.9 mg/dL (ref 0.3–1.2)
BUN: 6 mg/dL — AB (ref 8–23)
CALCIUM: 7.7 mg/dL — AB (ref 8.9–10.3)
CO2: 29 mmol/L (ref 22–32)
Chloride: 98 mmol/L (ref 98–111)
Creatinine, Ser: 0.8 mg/dL (ref 0.61–1.24)
GFR calc Af Amer: >60 mL/min (ref >60)
GFR calc non Af Amer: >60 mL/min (ref >60)
GLUCOSE: 96 mg/dL (ref 70–99)
Potassium: 3.2 mmol/L — ABNORMAL LOW (ref 3.5–5.1)
SODIUM: 138 mmol/L (ref 135–145)
Total Protein: 5.3 g/dL — ABNORMAL LOW (ref 6.5–8.1)

## 2018-08-13 LAB — ECHOCARDIOGRAM COMPLETE
Height: 68 in
Weight: 3506.2 oz

## 2018-08-13 LAB — RETICULOCYTES
RBC.: 2.52 MIL/uL — AB (ref 4.22–5.81)
RETIC COUNT ABSOLUTE: 70.6 10*3/uL (ref 19.0–186.0)
RETIC CT PCT: 2.8 % (ref 0.4–3.1)

## 2018-08-13 LAB — FOLATE: Folate: 35.2 ng/mL (ref >5.9)

## 2018-08-13 LAB — VITAMIN B12: VITAMIN B 12: 876 pg/mL (ref 180–914)

## 2018-08-13 LAB — FERRITIN: FERRITIN: 102 ng/mL (ref 24–336)

## 2018-08-13 MED ORDER — ZOLPIDEM TARTRATE 5 MG PO TABS
5.0000 mg | ORAL_TABLET | Freq: Every evening | ORAL | Status: DC | PRN
  Administered 2018-08-13: 5 mg via ORAL
  Filled 2018-08-13 (×2): qty 1

## 2018-08-13 MED ORDER — PHENOL 1.4 % MT LIQD
1.0000 | OROMUCOSAL | Status: DC | PRN
  Administered 2018-08-13: 1 via OROMUCOSAL
  Filled 2018-08-13: qty 177

## 2018-08-13 MED ORDER — ADULT MULTIVITAMIN W/MINERALS CH
1.0000 | ORAL_TABLET | Freq: Every day | ORAL | Status: DC
  Administered 2018-08-13 – 2018-08-19 (×7): 1 via ORAL
  Filled 2018-08-13 (×7): qty 1

## 2018-08-13 MED ORDER — MAGNESIUM SULFATE 4 GM/100ML IV SOLN
INTRAVENOUS | Status: AC
  Filled 2018-08-13: qty 100

## 2018-08-13 MED ORDER — ASPIRIN 81 MG PO CHEW
81.0000 mg | CHEWABLE_TABLET | Freq: Every day | ORAL | Status: DC
  Administered 2018-08-13 – 2018-08-19 (×7): 81 mg via ORAL
  Filled 2018-08-13 (×7): qty 1

## 2018-08-13 MED ORDER — MAGNESIUM SULFATE 4 GM/100ML IV SOLN
4.0000 g | Freq: Once | INTRAVENOUS | Status: AC
  Administered 2018-08-13: 4 g via INTRAVENOUS
  Filled 2018-08-13: qty 100

## 2018-08-13 MED ORDER — ADULT MULTIVITAMIN W/MINERALS CH: 1.0000 | ORAL_TABLET | Freq: Every day | ORAL | Status: DC

## 2018-08-13 MED ORDER — PERFLUTREN LIPID MICROSPHERE
INTRAVENOUS | Status: AC
  Administered 2018-08-13: 3 mL via INTRAVENOUS
  Filled 2018-08-13: qty 10

## 2018-08-13 MED ORDER — MAGNESIUM SULFATE 4 GM/100ML IV SOLN: 4.0000 g | Freq: Once | INTRAVENOUS | Status: DC

## 2018-08-13 MED ORDER — POTASSIUM CHLORIDE CRYS ER 20 MEQ PO TBCR
40.0000 meq | EXTENDED_RELEASE_TABLET | Freq: Two times a day (BID) | ORAL | Status: DC
  Administered 2018-08-13 – 2018-08-15 (×6): 40 meq via ORAL
  Filled 2018-08-13 (×6): qty 2

## 2018-08-13 MED ORDER — PERFLUTREN LIPID MICROSPHERE
1.0000 mL | INTRAVENOUS | Status: AC | PRN
  Filled 2018-08-13: qty 10

## 2018-08-13 MED ORDER — GUAIFENESIN-DM 100-10 MG/5ML PO SYRP
5.0000 mL | ORAL_SOLUTION | ORAL | Status: DC | PRN
  Administered 2018-08-13 – 2018-08-18 (×4): 5 mL via ORAL
  Filled 2018-08-13 (×5): qty 5

## 2018-08-13 MED ORDER — ENSURE ENLIVE PO LIQD
237.0000 mL | Freq: Two times a day (BID) | ORAL | Status: DC
  Administered 2018-08-13 – 2018-08-19 (×12): 237 mL via ORAL

## 2018-08-13 NOTE — Evaluation (Signed)
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 Pt currently with functional limitations due to the deficits listed below (see PT Problem List). PTA pt recently home from SNF reports limited household ambulation and support from family. Upon eval pt max to total assist of 2 for all mobility. Limited by painful BLE and weakness. Anticipate patient will need SNF placement again as he is not safe to be home alone at this time.   Pt will benefit from skilled PT to increase their independence and safety with mobility to allow discharge to the venue listed below.       Follow Up Recommendations SNF;Supervision/Assistance - 24 hour    Equipment Recommendations  (TBD next venue)    Recommendations for Other Services       Precautions / Restrictions Precautions Precautions: Fall Restrictions Weight Bearing Restrictions: No Other Position/Activity Restrictions: Pt reported wound care told him to not stand on feet until he can wear shoes. Called wound care and they did not tell him this. Pt is WBAT.      Mobility  Bed Mobility Overal bed mobility: Needs Assistance Bed Mobility: Supine to Sit     Supine to sit: Mod assist;HOB elevated     General bed mobility comments: Pt able to move his legs off of  the bed and reach with Bil UEs with cues to pull self around, needed A to bring trunk up and complete scooting around to EOB  Transfers                 General transfer comment: Did not attempt to stand due to patient said he was not suppose to stand (per wound care nurse) until he could get shoes on his feet--clarified this after session and pt can stand even with socks on. Pt not wanting to try lateral scoot into recliner due to pain in Bil LEs increasing while seated at EOB. Lateral scoot up towards [facility] with use of pad, pt total A +2 (pt did attempt to push with Bil UEs but not strong enough to move his body weight)  Ambulation/Gait                Stairs            Wheelchair Mobility    Modified Rankin (Stroke Patients Only)       Balance Overall balance assessment: Needs assistance Sitting-balance support: No upper extremity supported;Feet supported Sitting balance-Leahy Scale: Fair                                       Pertinent Vitals/Pain Pain Assessment: Faces Faces Pain Scale: Hurts even more Pain Location: Bil LEs (R>L) Pain Descriptors / Indicators: Pressure;Tightness;Sore Pain Intervention(s): Monitored during session;Limited activity within patient's tolerance;Premedicated before session    Home Living Family/patient  expects to be discharged to:: Skilled nursing facility Living Arrangements: Alone Available Help at Discharge: Family Type of Home: House Home Access: [place]: [facility]: Environmental consultant - 2 wheels;Shower seat Additional Comments: pt states he sleeps in lift chair    Prior Function Level of Independence: Needs assistance   Gait / Transfers Assistance Needed: ambulated household distances with RW, drives for pleasure, requires assist for limited community distances (Dr's appointments)  ADL's / Homemaking Assistance Needed: sister-in law does shopping, sons bathe 2x week, family  also helps him get dressed daily, reports he can toliet himself         Hand Dominance   Dominant Hand: Right    Extremity/Trunk Assessment   Upper Extremity Assessment Upper Extremity Assessment: Defer to OT evaluation    Lower Extremity Assessment Lower Extremity Assessment: (RLE limited by pain, 3-/5 gross. LLE 4-/5 gross w/ pain )       Communication   Communication: No difficulties  Cognition Arousal/Alertness: Awake/alert Behavior During Therapy: WFL for tasks assessed/performed Overall Cognitive Status: No family/caregiver present to determine baseline cognitive functioning                                 General Comments: Pt had a hard time remembering what channels for baseball with time lapse. Asked [place] while in room and then later when PT went back in to talk to him about being able to be up on his feet without shoes he asked the same question again.      General Comments      Exercises     Assessment/Plan    PT Assessment Patient needs continued PT services  PT Problem List Decreased strength;Decreased range of motion;Decreased activity tolerance       PT Treatment Interventions DME instruction;Gait training;Stair training;Therapeutic activities;Functional mobility training;Therapeutic exercise    PT Goals (Current goals can be found in the Care Plan section)  Acute Rehab PT Goals Patient Stated Goal: to go home PT Goal Formulation: With patient Time For Goal Achievement: 08/20/18 Potential to Achieve Goals: Fair    Frequency Min 3X/week   Barriers to discharge Decreased caregiver support      Co-evaluation PT/OT/SLP Co-Evaluation/Treatment: Yes Reason for Co-Treatment: For patient/therapist safety;To address functional/ADL transfers PT goals addressed during session: Mobility/safety with mobility;Balance;Proper use of DME;Strengthening/ROM OT goals addressed during session: ADL's and self-care;Strengthening/ROM       AM-PAC  PT "6 Clicks" Daily Activity  Outcome Measure Difficulty turning over in bed (including adjusting bedclothes, sheets and blankets)?: Unable Difficulty moving from lying on back to sitting on the side of the bed? : Unable Difficulty sitting down on and standing up from a chair with arms (e.g., wheelchair, bedside commode, etc,.)?: Unable Help needed moving to and from a bed to chair (including a wheelchair)?: Total Help needed walking in hospital room?: Total Help needed climbing 3-5 steps with a railing? : Total 6 Click Score: 6    End of Session Equipment Utilized During Treatment: Gait belt Activity Tolerance: Patient limited by fatigue;Patient limited by pain Patient left: in bed;with call bell/phone within reach Nurse Communication: Mobility status;Need for lift equipment PT Visit Diagnosis: Unsteadiness on feet (R26.81)    Time: 6222-9798 PT Time Calculation (min) (ACUTE ONLY): 30 min   Charges:   PT Evaluation $PT Eval Moderate Complexity: 1 Mod        Reinaldo Berber, PT,  DPT Acute Rehabilitation Services Pager: [phone] Office: [place] 08/13/2018, 6:28 PM

## 2018-08-13 NOTE — Plan of Care (Signed)

## 2018-08-13 NOTE — Progress Notes (Signed)
Initial Nutrition Assessment  DOCUMENTATION CODES:   Severe malnutrition in context of chronic illness  INTERVENTION:   Ensure Enlive po BID, each supplement provides 350 kcal and 20 grams of protein  Magic cup TID with meals, each supplement provides 290 kcal and 9 grams of protein  MVI with minerals daily  NUTRITION DIAGNOSIS:   Severe Malnutrition related to chronic illness(FTT, EtOH abuse, CHF) as evidenced by severe fat depletion, severe muscle depletion, edema.  GOAL:   Patient will meet greater than or equal to 90% of their needs  MONITOR:   PO intake, Labs, Weight trends, Supplement acceptance  REASON FOR ASSESSMENT:   Consult Assessment of nutrition requirement/status  ASSESSMENT:   72 yo male admitted with acute diastolic CHF with anasarca, acute respiratory failure, dysphagia, adult FTT. PMH includes EtOH abuse, macrocytic anemia, anxiety and depression, HTN, GERD, LE wounds   Pt alert, disgruntled on visit today. Appears frustrated. Son at bedside.   Recorded po intake 10% at breakfast this AM. Pt reports poor appetite.  Pt very vague with regards to po intake; pt reports he eats "when he feels it" and eats "what he feels like" Son does indicate that pt eating minimally since discharge on Saturday from rehab. Pt was at [facility] rehab for 21 days prior and initially eating well but po intake declined there as well. Pt was drinking Ensure 2x daily.  Per chart review, pt quit drinking about 10 days ago, prior to this however pt had been drinking a 6 pack of Ice [place] a day and had been for the last 8 years when his wife diet.   Admission wt 101.2 kg (223 pounds) current wt 99.4 kg (218 pounds); net negative 0.7 L since admission. Pt with significant edema on exam. Unsure of dry weight but pt indicating he has been weighing around 195 pounds. Plan to use 88.6 kg (195 pounds) as EDW at present. This is down from 3 years ago when pt weighed 255-260 pounds. At that  time, pt had hip replacement performed  Pt has been very week; since leaving rehab, pt has mostly been sitting in a recliner. Son indicates that pt has gotten up a handful of times for the bathroom and that is all.   Labs: potassium 3.2 (L), magnesium 1.5 (L) Meds: folic acid, lasix, mag ox, KCl, thiamine  NUTRITION - FOCUSED PHYSICAL EXAM:    Most Recent Value  Orbital Region  Severe depletion  Upper Arm Region  Moderate depletion  Thoracic and Lumbar Region  Unable to assess [significant edema]  Buccal Region  Severe depletion  Temple Region  Severe depletion  Clavicle Bone Region  Severe depletion  Clavicle and Acromion Bone Region  Moderate depletion  Scapular Bone Region  Moderate depletion  Patellar Region  Unable to assess [significant edema]  Anterior Thigh Region  Unable to assess [significant edema]  Posterior Calf Region  Unable to assess [significant edema]  Edema (RD Assessment)  Severe       Diet Order:   Diet Order            Diet Heart Room service appropriate? Yes; Fluid consistency: Thin; Fluid restriction: 1200 mL Fluid  Diet effective now              EDUCATION NEEDS:   Not appropriate for education at this time  Skin:  Skin Assessment: Skin Integrity Issues: Skin Integrity Issues:: Other (Comment) Other: BLE venous stasis ulcers  Last BM:  PTA  Height:  Ht Readings from Last 1 Encounters:  08/12/18 5\' 8"  (1.727 m)    Weight:   Wt Readings from Last 1 Encounters:  08/13/18 99.4 kg    Ideal Body Weight:     BMI:  Body mass index is 33.32 kg/m.  Estimated Nutritional Needs:   Kcal:  2000-2300 kcals   Protein:  105-120 g   Fluid:  1.2 L fluid restriction at present   [facility] MS, RD, LDN, CNSC [phone] Pager  [phone] Weekend/On-Call Pager

## 2018-08-13 NOTE — Patient Outreach (Signed)
[facility]) Care Management  08/13/2018  CHRISTINE SCHIEFELBEIN 12-29-1945 [id number]  BSW received a voice message from the patients son indicating the patient was admitted to the hospital. Upon chart review it is noted the patient was admitted to [facility] notified [facility] hospital liaison, Natividad Brood of patients current disposition.  Daneen Schick, BSW, CDP [facility] [phone]

## 2018-08-13 NOTE — Progress Notes (Signed)
PT Cancellation Note  Patient Details Name: Jesus Daniel MRN: [id number] DOB: 07/22/1946   Cancelled Treatment:    Reason Eval/Treat Not Completed: Patient at procedure or test/unavailable Pt being seen by speech at the moment, will re-attempt.   Reinaldo Berber, PT, DPT Acute Rehab Services Pager: [phone]     Reinaldo Berber 08/13/2018, 9:00 AM

## 2018-08-13 NOTE — Progress Notes (Signed)
Patient transported in stable condition to 28E09 with RN. All patient belongings placed in patient belongings bag and transferred with patient. Pt stated he notified his sons he was being transferred to another floor.

## 2018-08-13 NOTE — Progress Notes (Signed)
  Echocardiogram 2D Echocardiogram has been performed.  Jesus Daniel 08/13/2018, 10:33 AM

## 2018-08-13 NOTE — Evaluation (Signed)
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 He will beneifit from acute OT with follow up OT at SNF to work towards a Mod I level.    Follow Up Recommendations  SNF;Supervision/Assistance - 24 hour    Equipment Recommendations  None recommended by OT       Precautions / Restrictions Precautions Precautions: Fall Restrictions Weight Bearing Restrictions: No Other Position/Activity Restrictions: Pt reported wound care told him to not stand on feet until he can wear shoes. Called wound care and they did not tell him this. Pt is WBAT.      Mobility Bed Mobility Overal bed mobility: Needs Assistance Bed Mobility: Supine to Sit     Supine to sit: Mod assist;HOB elevated     General bed mobility comments: Pt able to move his legs off of the bed and reach with Bil UEs with cues to pull self around, needed A to bring trunk up and complete scooting around to EOB  Transfers                 General transfer comment: Did not attempt to stand due to patient said he was not suppose to stand (per wound care nurse) until he  could get shoes on his feet--clarified this after session and pt can stand even with socks on. Pt not wanting to try lateral scoot into recliner due to pain in Bil LEs increasing while seated at EOB. Lateral scoot up towards [facility] with use of pad, pt total A +2 (pt did attempt to push with Bil UEs but not strong enough to move his body weight)    Balance Overall balance assessment: Needs assistance Sitting-balance support: No upper extremity supported;Feet supported Sitting balance-Leahy Scale: Fair                                     ADL either performed or assessed with clinical judgement   ADL Overall ADL's : Needs assistance/impaired Eating/Feeding: Independent;Sitting   Grooming: Set up;Sitting   Upper Body Bathing: Set up;Sitting   Lower Body Bathing: Total assistance;Bed level   Upper Body Dressing : Minimal assistance;Sitting   Lower Body Dressing: Total assistance;Bed level     Toilet Transfer Details (indicate cue type and reason): pt not wanting to try even a lateral scoot today due to pain in his legs with sitting EOB                 Vision Patient Visual Report: No change from baseline              Pertinent Vitals/Pain Pain Assessment: Faces Faces Pain Scale: Hurts even more Pain  Location: Bil LEs (R>L) Pain Descriptors / Indicators: Pressure;Tightness;Sore Pain Intervention(s): Limited activity within patient's tolerance;Monitored during session;Repositioned     Hand Dominance Right   Extremity/Trunk Assessment Upper Extremity Assessment Upper Extremity Assessment: Overall WFL for tasks assessed   Lower Extremity Assessment Lower Extremity Assessment: Defer to PT evaluation       Communication Communication Communication: No difficulties   Cognition Arousal/Alertness: Awake/alert Behavior During Therapy: WFL for tasks assessed/performed Overall Cognitive Status: No family/caregiver present to determine baseline cognitive  functioning                                 General Comments: Pt had a hard time remembering what channels for baseball with time lapse. Asked [place] while in room and then later when PT went back in to talk to him about being able to be up on his feet without shoes he asked the same question again.              Home Living Family/patient expects to be discharged to:: Skilled nursing facility Living Arrangements: Alone Available Help at Discharge: Family(children available intermittently) Type of Home: House Home Access: Ramped entrance     Home Layout: One level     Bathroom Shower/Tub: Occupational psychologist: Handicapped height     Home Equipment: Environmental consultant - 2 wheels;Shower seat   Additional Comments: pt states he sleeps in lift chair      Prior Functioning/Environment Level of Independence: Needs assistance  Gait / Transfers Assistance Needed: ambulated household distances with RW, drives for pleasure, requires assist for limited community distances (Dr's appointments) ADL's / Homemaking Assistance Needed: sister-in law does shopping, sons bathe 2x week, family also helps him get dressed daily, reports he can toliet himself             OT Problem List: Decreased strength;Decreased range of motion;Impaired balance (sitting and/or standing);Pain      OT Treatment/Interventions: Self-care/ADL training;Balance training;Therapeutic activities;DME and/or AE instruction;Patient/family education    OT Goals(Current goals can be found in the care plan section) Acute Rehab OT Goals Patient Stated Goal: to go home OT Goal Formulation: With patient Time For Goal Achievement: 08/27/18 Potential to Achieve Goals: Good  OT Frequency: Min 2X/week   Barriers to D/C: Decreased caregiver support          Co-evaluation PT/OT/SLP Co-Evaluation/Treatment: Yes Reason for Co-Treatment: For patient/therapist safety   OT goals addressed during session: ADL's  and self-care;Strengthening/ROM      AM-PAC PT "6 Clicks" Daily Activity     Outcome Measure Help from another person eating meals?: None Help from another person taking care of personal grooming?: A Little Help from another person toileting, which includes using toliet, bedpan, or urinal?: Total Help from another person bathing (including washing, rinsing, drying)?: A Lot Help from another person to put on and taking off regular upper body clothing?: A Little Help from another person to put on and taking off regular lower body clothing?: Total 6 Click Score: 14   End of Session Nurse Communication: Mobility status  Activity Tolerance: Patient limited by pain Patient left: in bed;with call bell/phone within reach;with bed alarm set  OT Visit Diagnosis: Unsteadiness on feet (R26.81);Other abnormalities of gait and mobility (R26.89);Muscle weakness (generalized) (M62.81);Pain Pain - Right/Left: Left(Bil) Pain - part of body: Leg                Time: 1415-1434 OT  Time Calculation (min): 19 min Charges:  OT General Charges $OT Visit: 1 Visit OT Evaluation $OT Eval Moderate Complexity: 1 Mod  [place] [address], OTR/L Acute [facility] [phone] Office [phone]

## 2018-08-13 NOTE — Consult Note (Signed)
[place] Nurse wound consult note Patient seen on MC2H13, no family present Reason for Consult: LE wounds Wound type: Patient states these are related to trauma wounds where skin was abraded off approximately one month ago. Appearance of BLE is consistent with chronic changes associated with venous insufficiency. Those changes are brown discoloration consistent with hemosiderin staining and orange peel texture. Pitting edema present to BLE.   Injury POA: Yes Measurement: Right pretibial area with two wounds, one wound measures 2.2cm x 1.9cm and is 75% yellow, 25% pink. Second pretibial wound measures 2cm x 1.8cm with 50% yellow and 50% pink.  Right medial lower leg measures 0.6cm x 0.6cm and is 100% pink.  Left lateral lower leg measures 13cm x 3cm and is 75% yellow and 25% pink.  Drainage (amount, consistency, odor) wounds without oozing and no odor.  Periwound: As previously described.  Dressing procedure/placement/frequency: Xeroform to wounds, spiral wrap with kerlix beginning behind toes to just below the knees, then ace wraps in same manner. Dressings applied at conclusion of evaluation.  Monitor the wound area(s) for worsening of condition such as: Signs/symptoms of infection,  Increase in size,  Development of or worsening of odor, Development of pain, or increased pain at the affected locations.  Notify the medical team if any of these develop.  Thank you for the consult.  Discussed plan of care with the patient and bedside nurse.  [place] nurse will not follow at this time.  Please re-consult the [place] team if needed.  Val Riles, RN, MSN, CWOCN, CNS-BC, pager [phone]

## 2018-08-13 NOTE — Progress Notes (Signed)
@IPLOG @        PROGRESS NOTE                                                                                                                                                                                                             Patient Demographics:    Jesus Daniel, is a 72 y.o. male, DOB - 03-12-1946, ZOX:[id number]  Admit date - 08/12/2018   Admitting Physician Modena Jansky, MD  Outpatient Primary MD for the patient is Bernerd Limbo, MD  LOS - 1  Chief Complaint  Patient presents with  . 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   Subjective:    Jesus Daniel today has, No headache, No chest pain, No abdominal pain - No Nausea, No new weakness tingling or numbness, No Cough , improved shortness of breath.   Assessment  & Plan :    1.  Acute on chronic nonspecific CHF.  No previous echo on file, with mild acute on chronic hypoxic respiratory failure.  Echo is pending, he has been placed on IV Lasix for diuresis with good effect, his breathing is much improved and he is down to 2 L nasal cannula oxygen, continue monitoring weight, intake output and electrolytes.  2.  Alcohol abuse.  Last drink over 10 days ago.  Counseled to quit.  No signs of DTs.  Catheter and thiamine supplementation.  3.  Hypomagnesemia.  Replaced.  4.  Mildly elevated troponin.  Due to CHF demand,  chest pain-free, EKG nonacute, will place on low-dose aspirin, continue statin, blood pressure too low for beta-blocker.  Check echocardiogram for EF and wall motion.  5.  Dyslipidemia.  On statin continue.  6.  Chronic right hip infection.  Continue doxycycline, follows with Dr. Johnnye Sima ID.  7.  Multiple falls at home with bilateral leg wounds.  Wound care consultation and supportive care, PT eval.  8.  Chronic macrocytic anemia.  Check anemia panel including B12 levels.  9.  Mild intermittent dysphagia.  Speech therapy requested to consult.  Denies any aspiration or choking.  10.  Moderate protein calorie malnutrition.  Placed on pro-stat.  11. Chronic pain. Reports that he sees Dr. Hardin Negus, pain management.  Judicious use of opioids.     Family Communication  :  None present  Code Status :  Full  Disposition Plan  :  Stepdown  Consults  :  None  Procedures  :      DVT Prophylaxis  :  Lovenox    Lab Results  Component Value Date   PLT 220 08/13/2018    Diet :  Diet Order            Diet Heart Room service appropriate? Yes; Fluid consistency: Thin; Fluid restriction: 1200 mL Fluid  Diet effective now               Inpatient Medications Scheduled Meds: . atorvastatin  10 mg Oral Daily  . doxycycline  100 mg Oral Q12H  . enoxaparin (LOVENOX) injection  40 mg Subcutaneous Q24H  . feeding supplement (ENSURE ENLIVE)  237 mL Oral BID BM  . folic acid  1 mg Oral Daily  . furosemide  60 mg Intravenous BID  . magnesium oxide  400 mg Oral Daily  . potassium chloride  40 mEq Oral BID  . sodium chloride flush  3 mL Intravenous Q12H  . thiamine  100 mg Oral Daily   Continuous Infusions: . magnesium sulfate 1 - 4 g bolus IVPB 4 g (08/13/18 1253)   PRN Meds:.acetaminophen **OR** acetaminophen, albuterol, guaiFENesin-dextromethorphan, methocarbamol, oxyCODONE, phenol, polyethylene glycol  Antibiotics  :   Anti-infectives (From admission, onward)   Start      Dose/Rate Route Frequency Ordered Stop   08/12/18 2200  doxycycline (VIBRA-TABS) tablet 100 mg    Note to Pharmacy:  TAKE 1 TABLET (100 MG TOTAL) BY MOUTH 2 (TWO) TIMES DAILY.     100 mg Oral Every 12 hours 08/12/18 1827     08/12/18 1545  vancomycin (VANCOCIN) 1,500 mg in sodium chloride 0.9 % 500 mL IVPB     1,500 mg 250 mL/hr over 120 Minutes Intravenous  Once 08/12/18 1533 08/12/18 1801          Objective:   Vitals:   08/13/18 0900 08/13/18 1000 08/13/18 1100 08/13/18 1200  BP: 97/60 106/64 101/69 102/64  Pulse: 94 93 84 97  Resp: 20 (!) 23 (!) 26 (!) 24  Temp:   (!) 97.4 F (36.3 C)   TempSrc:   Oral   SpO2: 99% 100% 96% 94%  Weight:      Height:        Wt Readings from Last 3 Encounters:  08/13/18 99.4 kg  07/15/18 100.4 kg  11/04/17 103.4 kg     Intake/Output Summary (Last 24 hours) at 08/13/2018 1331 Last data filed at 08/13/2018 1253 Gross per 24 hour  Intake 1633.84 ml  Output 2275 ml  Net -641.16 ml     Physical Exam  Awake Alert, Oriented X 3, No new F.N deficits, Normal affect [place].AT,PERRAL Supple Neck,No JVD, No cervical lymphadenopathy appriciated.  Symmetrical Chest wall movement, Good air movement bilaterally, few rales RRR,No Gallops,Rubs or new Murmurs, No Parasternal Heave +ve B.Sounds, Abd Soft, No tenderness, No organomegaly appriciated, No rebound - guarding or rigidity. No Cyanosis, 3+ leg edema    Data Review:    CBC Recent Labs  Lab 08/12/18 1517 08/13/18 0615  WBC 7.2 6.8  HGB 9.0*  7.8*  HCT 29.7* 24.9*  PLT 231 220  MCV 103.5* 100.8*  MCH 31.4 31.6  MCHC 30.3 31.3  RDW 14.5 14.6  LYMPHSABS 1.2  --   MONOABS 0.7  --   EOSABS 0.1  --   BASOSABS 0.0  --     Chemistries  Recent Labs  Lab 08/12/18 1517 08/12/18 1919 08/13/18 0615 08/13/18 1023  NA 136  --  138  --   K 3.1*  --  3.2*  --   CL 97*  --  98  --   CO2 29  --  29  --   GLUCOSE 118*  --  96  --   BUN 8  --  6*  --   CREATININE 0.87  --  0.80  --    CALCIUM 8.1*  --  7.7*  --   MG  --  1.2*  --  1.5*  AST 41  --  30  --   ALT 19  --  17  --   ALKPHOS 122  --  91  --   BILITOT 0.7  --  0.9  --    ------------------------------------------------------------------------------------------------------------------ No results for input(s): CHOL, HDL, LDLCALC, TRIG, CHOLHDL, LDLDIRECT in the last 72 hours.  No results found for: HGBA1C ------------------------------------------------------------------------------------------------------------------ No results for input(s): TSH, T4TOTAL, T3FREE, THYROIDAB in the last 72 hours.  Invalid input(s): FREET3 ------------------------------------------------------------------------------------------------------------------ No results for input(s): VITAMINB12, FOLATE, FERRITIN, TIBC, IRON, RETICCTPCT in the last 72 hours.  Coagulation profile Recent Labs  Lab 08/12/18 1919  INR 1.10    No results for input(s): DDIMER in the last 72 hours.  Cardiac Enzymes Recent Labs  Lab 08/12/18 1919 08/12/18 2358 08/13/18 0615  TROPONINI 0.13* 0.14* 0.11*   ------------------------------------------------------------------------------------------------------------------    Component Value Date/Time   BNP 395.8 (H) 08/12/2018 1518    Micro Results Recent Results (from the past 240 hour(s))  Blood Culture (routine x 2)     Status: None (Preliminary result)   Collection Time: 08/12/18  3:10 PM  Result Value Ref Range Status   Specimen Description BLOOD BLOOD RIGHT FOREARM  Final   Special Requests   Final    BOTTLES DRAWN AEROBIC AND ANAEROBIC Blood Culture adequate volume   Culture   Final    NO GROWTH < 24 HOURS Performed at [facility], [place] [address] [place], [place] [address]    Report Status PENDING  Incomplete  Blood Culture (routine x 2)     Status: None (Preliminary result)   Collection Time: 08/12/18  3:55 PM  Result Value Ref Range Status   Specimen Description BLOOD  BLOOD RIGHT FOREARM  Final   Special Requests   Final    BOTTLES DRAWN AEROBIC AND ANAEROBIC Blood Culture adequate volume   Culture   Final    NO GROWTH < 24 HOURS Performed at [facility], [place] [address] [place], [place] [address]    Report Status PENDING  Incomplete  MRSA PCR Screening     Status: None   Collection Time: 08/12/18  6:59 PM  Result Value Ref Range Status   MRSA by PCR NEGATIVE NEGATIVE Final    Comment:        The GeneXpert MRSA Assay (FDA approved for NASAL specimens only), is one component of a comprehensive MRSA colonization surveillance program. It is not intended to diagnose MRSA infection nor to guide or monitor treatment for MRSA infections. Performed at [facility], [place]  [address], [place], [place] [address]     Radiology Reports Dg Chest 1 View  Result Date: 08/12/2018 CLINICAL DATA:  Shortness of breath EXAM: CHEST  1 VIEW COMPARISON:  Chest radiograph 07/04/2015 FINDINGS: There is shallow lung inflation. There is mild cardiomegaly. There is no focal airspace consolidation or pulmonary edema. There is no pleural effusion or pneumothorax. IMPRESSION: Shallow lung inflation and mild cardiomegaly. Electronically Signed   By: Ulyses Jarred M.D.   On: 08/12/2018 15:13    Time Spent in minutes  30   Lala Lund M.D on 08/13/2018 at 1:31 PM  To page go to [email or web address] - password [facility]
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
## 2018-08-14 ENCOUNTER — Ambulatory Visit: Payer: PPO

## 2018-08-14 LAB — BASIC METABOLIC PANEL
Anion gap: 11 (ref 5–15)
BUN: 5 mg/dL — AB (ref 8–23)
CO2: 31 mmol/L (ref 22–32)
CREATININE: 0.82 mg/dL (ref 0.61–1.24)
Calcium: 7.9 mg/dL — ABNORMAL LOW (ref 8.9–10.3)
Chloride: 95 mmol/L — ABNORMAL LOW (ref 98–111)
GFR calc Af Amer: >60 mL/min (ref >60)
GFR calc non Af Amer: >60 mL/min (ref >60)
Glucose, Bld: 86 mg/dL (ref 70–99)
Potassium: 3.3 mmol/L — ABNORMAL LOW (ref 3.5–5.1)
SODIUM: 137 mmol/L (ref 135–145)

## 2018-08-14 LAB — TSH: TSH: 3.525 u[IU]/mL (ref 0.350–4.500)

## 2018-08-14 LAB — CBC
HEMATOCRIT: 23.4 % — AB (ref 39.0–52.0)
Hemoglobin: 7.4 g/dL — ABNORMAL LOW (ref 13.0–17.0)
MCH: 31.2 pg (ref 26.0–34.0)
MCHC: 31.6 g/dL (ref 30.0–36.0)
MCV: 98.7 fL (ref 78.0–100.0)
Platelets: 218 10*3/uL (ref 150–400)
RBC: 2.37 MIL/uL — ABNORMAL LOW (ref 4.22–5.81)
RDW: 14.7 % (ref 11.5–15.5)
WBC: 5 10*3/uL (ref 4.0–10.5)

## 2018-08-14 LAB — MAGNESIUM: Magnesium: 1.7 mg/dL (ref 1.7–2.4)

## 2018-08-14 MED ORDER — ALPRAZOLAM 0.5 MG PO TABS
0.5000 mg | ORAL_TABLET | Freq: Two times a day (BID) | ORAL | Status: DC | PRN
  Administered 2018-08-14 – 2018-08-18 (×5): 0.5 mg via ORAL
  Filled 2018-08-14 (×5): qty 1

## 2018-08-14 MED ORDER — MAGNESIUM SULFATE IN D5W 1-5 GM/100ML-% IV SOLN
1.0000 g | Freq: Once | INTRAVENOUS | Status: AC
  Administered 2018-08-14: 1 g via INTRAVENOUS
  Filled 2018-08-14: qty 100

## 2018-08-14 MED ORDER — ZOLPIDEM TARTRATE 5 MG PO TABS
5.0000 mg | ORAL_TABLET | Freq: Once | ORAL | Status: AC
  Administered 2018-08-14: 5 mg via ORAL

## 2018-08-14 MED ORDER — PRO-STAT SUGAR FREE PO LIQD
30.0000 mL | Freq: Three times a day (TID) | ORAL | Status: DC
  Administered 2018-08-14 – 2018-08-19 (×18): 30 mL via ORAL
  Filled 2018-08-14 (×18): qty 30

## 2018-08-14 MED ORDER — POTASSIUM CHLORIDE 10 MEQ/100ML IV SOLN
10.0000 meq | INTRAVENOUS | Status: AC
  Administered 2018-08-14 (×2): 10 meq via INTRAVENOUS
  Filled 2018-08-14 (×2): qty 100

## 2018-08-14 MED ORDER — FERROUS SULFATE 325 (65 FE) MG PO TABS
325.0000 mg | ORAL_TABLET | Freq: Three times a day (TID) | ORAL | Status: DC
  Administered 2018-08-14 – 2018-08-19 (×16): 325 mg via ORAL
  Filled 2018-08-14 (×16): qty 1

## 2018-08-14 NOTE — Consult Note (Addendum)
[facility] Nurse wound consult note Consult performed yesterday by [facility] team for legs, refer to previous progress notes.  Requested to assess bilat arm skin tears today. Wound type: Left antecubital area to arm with scattered partial thickness skin tears, affected area is approx 4X4cm and wounds are pink, shallow and dry.  Skin is very thin and fragile with multiple bruises. Right arm with full thickness skin tear; 5X7X.1, no skin approximated over wound. Drainage (amount, consistency, odor) Mod amt pink drainage, no odor or active bleeding. Periwound: Dressing procedure/placement/frequency: Foam dressing to protect and promote healing to left arm.  Xeroform gauze to right arm to promote healing, ABD pads to absorb drainage.  Discussed plan of care with patient and family member at the bedside. Please re-consult if further assistance is needed.  Thank-you,  Julien Girt MSN, [place], [place], [place], [place]

## 2018-08-14 NOTE — Consult Note (Signed)
   [facility] CM Inpatient Consult   08/14/2018  YUNUS STOKLOSA [birth year] [id number]    Mr. Harold is active with [place] Management program. He is followed by [facility] RNCM and [facility] Social Worker. Please see chart review tab then encounters for patient outreach details.   Spoke with Mr. Easterly at bedside to discuss ongoing [place] [facility] Management program services. He is already aware of recommendations for SNF.   Spoke with inpatient RNCM to make aware [facility] is active.  Will continue to follow for disposition planning and progression.    Marthenia Rolling, MSN-Ed, RN,BSN [facility] Liaison [phone]

## 2018-08-14 NOTE — Progress Notes (Signed)
@IPLOG @        PROGRESS NOTE                                                                                                                                                                                                             Patient Demographics:    Jesus Daniel, is a 72 y.o. male, DOB - 07-27-1946, ZOX:[id number]  Admit date - 08/12/2018   Admitting Physician Modena Jansky, MD  Outpatient Primary MD for the patient is Bernerd Limbo, MD  LOS - 2  Chief Complaint  Patient presents with  . 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   Subjective:   Patient in bed, appears comfortable, denies any headache, no fever, no chest pain or pressure, no shortness of breath , no abdominal pain. No focal weakness.    Assessment  & Plan :    1.  Acute on chronic diastolic CHF with EF 55 to 60% in the setting of mild to moderate aortic stenosis.   He has been placed on salt and fluid restriction, he is being diuresed with IV Lasix to good effect, lungs are almost clear and peripheral edema is improving, monitor weight, intake output and electrolytes, blood pressure too soft for beta-blocker.  Post discharge he will require outpatient cardiology follow-up.  2.  Alcohol abuse.  Last drink over 10 days ago.  Counseled to quit.  No signs of DTs.  Catheter and thiamine supplementation.  3.   Hypomagnesemia.  Replaced and stable we will continue to monitor.  4.  Mildly elevated troponin.  Due to CHF demand, chest pain-free, EKG nonacute, will place on low-dose aspirin, continue statin, blood pressure too low for beta-blocker.  Stable echocardiogram with no wall motion abnormalities and preserved EF.  5.  Dyslipidemia.  On statin continue.  6.  Chronic right hip infection.  Continue doxycycline, follows with Dr. Johnnye Sima ID.  7.  Multiple falls at home with bilateral leg wounds.  Wound care  consultation and supportive care, PT eval.  8.  Chronic macrocytic anemia.  We will B12 levels, check TSH, he does have iron deficiency and will be placed on oral iron, outpatient age-appropriate iron deficiency anemia work-up by PCP and GI as needed.  9.  Mild intermittent dysphagia.  He was seen and cleared by speech.  10.  Moderate protein calorie malnutrition.  Placed on pro-stat.  11. Chronic pain. Reports that he sees Dr. Hardin Negus, pain management.  Judicious use of opioids.     Family Communication  : Son updated bedside  Code Status :  Full  Disposition Plan  : Telemetry  Consults  :  None  Procedures  :    TTE - - Left ventricle: The cavity size was normal. Wall thickness was  increased in a pattern of mild LVH. Systolic function was normal.  The estimated ejection fraction was in the range of 55% to 60%.  Wall motion was normal; there were no regional wall motion abnormalities. - Aortic valve: The aortic valve visually appears to have reduced   cusp separation but gradient is not high. suggest clinical  correlation as degree of stenosis may be under estimated. Cusp  separation was reduced. Valve mobility was moderately restricted. Transvalvular velocity was increased. There was mild to moderate stenosis. Valve area (VTI): 1.45 cm^2. Valve area (Vmean): 1.31  cm^2. - Mitral valve: Mildly calcified annulus. Mildly calcified leaflets.  . - Left atrium: The atrium was severely  dilated.  DVT Prophylaxis  :  Lovenox    Lab Results  Component Value Date   PLT 218 08/14/2018    Diet :  Diet Order            Diet Heart Room service appropriate? Yes; Fluid consistency: Thin; Fluid restriction: 1200 mL Fluid  Diet effective now               Inpatient Medications Scheduled Meds: . aspirin  81 mg Oral Daily  . atorvastatin  10 mg Oral Daily  . doxycycline  100 mg Oral Q12H  . enoxaparin (LOVENOX) injection  40 mg Subcutaneous Q24H  . feeding supplement (ENSURE ENLIVE)  237 mL Oral BID BM  . feeding supplement (PRO-STAT SUGAR FREE 64)  30 mL Oral TID WC  . ferrous sulfate  325 mg Oral TID WC  . folic acid  1 mg Oral Daily  . furosemide  60 mg Intravenous BID  . magnesium oxide  400 mg Oral Daily  . multivitamin with minerals  1 tablet Oral Q breakfast  . potassium chloride  40 mEq Oral BID  . sodium chloride flush  3 mL Intravenous Q12H  . thiamine  100 mg Oral Daily   Continuous Infusions:  PRN Meds:.acetaminophen **OR** [DISCONTINUED] acetaminophen, albuterol, guaiFENesin-dextromethorphan, methocarbamol, oxyCODONE, phenol, polyethylene glycol, zolpidem  Antibiotics  :   Anti-infectives (From admission, onward)   Start     Dose/Rate Route Frequency Ordered Stop   08/12/18 2200  doxycycline (VIBRA-TABS) tablet 100 mg    Note to Pharmacy:  TAKE 1 TABLET (100 MG TOTAL) BY MOUTH 2 (TWO) TIMES DAILY.     100 mg Oral Every 12 hours 08/12/18 1827     08/12/18 1545  vancomycin (VANCOCIN) 1,500 mg in sodium chloride 0.9 % 500 mL IVPB     1,500 mg 250 mL/hr over 120 Minutes Intravenous  Once 08/12/18 1533 08/13/18 0700          Objective:   Vitals:   08/14/18 0000 08/14/18 0400  08/14/18 0454 08/14/18 0836  BP: 92/60 101/71  102/64  Pulse: 100 86  85  Resp: 18 (!) 28  (!) 29  Temp:   (!) 97.3 F (36.3 C) 98.3 F (36.8 C)  TempSrc:   Oral Axillary  SpO2: 93% 91%  94%  Weight:   97.4 kg   Height:        Wt Readings from Last 3 Encounters:   08/14/18 97.4 kg  07/15/18 100.4 kg  11/04/17 103.4 kg     Intake/Output Summary (Last 24 hours) at 08/14/2018 1205 Last data filed at 08/14/2018 1009 Gross per 24 hour  Intake 1010 ml  Output 2475 ml  Net -1465 ml     Physical Exam  Awake Alert, Oriented X 3, No new F.N deficits, Normal affect [place].AT,PERRAL Supple Neck,No JVD, No cervical lymphadenopathy appriciated.  Symmetrical Chest wall movement, Good air movement bilaterally, CTAB RRR,No Gallops, Rubs or new Murmurs, No Parasternal Heave +ve B.Sounds, Abd Soft, No tenderness, No organomegaly appriciated, No rebound - guarding or rigidity. No Cyanosis, Clubbing 2+ leg edema    Data Review:    CBC Recent Labs  Lab 08/12/18 1517 08/13/18 0615 08/14/18 0353  WBC 7.2 6.8 5.0  HGB 9.0* 7.8* 7.4*  HCT 29.7* 24.9* 23.4*  PLT 231 220 218  MCV 103.5* 100.8* 98.7  MCH 31.4 31.6 31.2  MCHC 30.3 31.3 31.6  RDW 14.5 14.6 14.7  LYMPHSABS 1.2  --   --   MONOABS 0.7  --   --   EOSABS 0.1  --   --   BASOSABS 0.0  --   --     Chemistries  Recent Labs  Lab 08/12/18 1517 08/12/18 1919 08/13/18 0615 08/13/18 1023 08/14/18 0353  NA 136  --  138  --  137  K 3.1*  --  3.2*  --  3.3*  CL 97*  --  98  --  95*  CO2 29  --  29  --  31  GLUCOSE 118*  --  96  --  86  BUN 8  --  6*  --  5*  CREATININE 0.87  --  0.80  --  0.82  CALCIUM 8.1*  --  7.7*  --  7.9*  MG  --  1.2*  --  1.5* 1.7  AST 41  --  30  --   --   ALT 19  --  17  --   --   ALKPHOS 122  --  91  --   --   BILITOT 0.7  --  0.9  --   --    ------------------------------------------------------------------------------------------------------------------ No results for input(s): CHOL, HDL, LDLCALC, TRIG, CHOLHDL, LDLDIRECT in the last 72 hours.  No results found for: HGBA1C ------------------------------------------------------------------------------------------------------------------ No results for input(s): TSH, T4TOTAL, T3FREE, THYROIDAB in the last 72  hours.  Invalid input(s): FREET3 ------------------------------------------------------------------------------------------------------------------ Recent Labs    08/13/18 1407 08/13/18 1411  VITAMINB12  --  876  FOLATE 35.2  --   FERRITIN  --  102  TIBC  --  168*  IRON  --  22*  RETICCTPCT  --  2.8    Coagulation profile Recent Labs  Lab 08/12/18 1919  INR 1.10    No results for input(s): DDIMER in the last 72 hours.  Cardiac Enzymes Recent Labs  Lab 08/12/18 1919 08/12/18 2358 08/13/18 0615  TROPONINI 0.13* 0.14* 0.11*   ------------------------------------------------------------------------------------------------------------------    Component Value Date/Time   BNP 395.8 (H) 08/12/2018 1518  Micro Results Recent Results (from the past 240 hour(s))  Blood Culture (routine x 2)     Status: None (Preliminary result)   Collection Time: 08/12/18  3:10 PM  Result Value Ref Range Status   Specimen Description BLOOD BLOOD RIGHT FOREARM  Final   Special Requests   Final    BOTTLES DRAWN AEROBIC AND ANAEROBIC Blood Culture adequate volume   Culture   Final    NO GROWTH < 24 HOURS Performed at [facility], [address] [place], [place] [address]    Report Status PENDING  Incomplete  Blood Culture (routine x 2)     Status: None (Preliminary result)   Collection Time: 08/12/18  3:55 PM  Result Value Ref Range Status   Specimen Description BLOOD BLOOD RIGHT FOREARM  Final   Special Requests   Final    BOTTLES DRAWN AEROBIC AND ANAEROBIC Blood Culture adequate volume   Culture   Final    NO GROWTH < 24 HOURS Performed at [facility], [place] [address] [place], [place] [address]    Report Status PENDING  Incomplete  MRSA PCR Screening     Status: None   Collection Time: 08/12/18  6:59 PM  Result Value Ref Range Status   MRSA by PCR NEGATIVE NEGATIVE Final    Comment:        The GeneXpert MRSA Assay (FDA approved for NASAL specimens only), is  one component of a comprehensive MRSA colonization surveillance program. It is not intended to diagnose MRSA infection nor to guide or monitor treatment for MRSA infections. Performed at [facility], [place] [address], [place], [place] [address]     Radiology Reports Dg Chest 1 View  Result Date: 08/12/2018 CLINICAL DATA:  Shortness of breath EXAM: CHEST  1 VIEW COMPARISON:  Chest radiograph 07/04/2015 FINDINGS: There is shallow lung inflation. There is mild cardiomegaly. There is no focal airspace consolidation or pulmonary edema. There is no pleural effusion or pneumothorax. IMPRESSION: Shallow lung inflation and mild cardiomegaly. Electronically Signed   By: Ulyses Jarred M.D.   On: 08/12/2018 15:13    Time Spent in minutes  30   Lala Lund M.D on 08/14/2018 at 12:05 PM  To page go to [email or web address] - password [facility]

## 2018-08-15 DIAGNOSIS — Z515 Encounter for palliative care: Secondary | ICD-10-CM

## 2018-08-15 DIAGNOSIS — E785 Hyperlipidemia, unspecified: Secondary | ICD-10-CM

## 2018-08-15 DIAGNOSIS — Z7189 Other specified counseling: Secondary | ICD-10-CM

## 2018-08-15 DIAGNOSIS — E43 Unspecified severe protein-calorie malnutrition: Secondary | ICD-10-CM

## 2018-08-15 LAB — CBC
HCT: 23.5 % — ABNORMAL LOW (ref 39.0–52.0)
Hemoglobin: 7.3 g/dL — ABNORMAL LOW (ref 13.0–17.0)
MCH: 31.1 pg (ref 26.0–34.0)
MCHC: 31.1 g/dL (ref 30.0–36.0)
MCV: 100 fL (ref 78.0–100.0)
PLATELETS: 221 10*3/uL (ref 150–400)
RBC: 2.35 MIL/uL — ABNORMAL LOW (ref 4.22–5.81)
RDW: 14.9 % (ref 11.5–15.5)
WBC: 5.5 10*3/uL (ref 4.0–10.5)

## 2018-08-15 LAB — BASIC METABOLIC PANEL
Anion gap: 9 (ref 5–15)
BUN: 10 mg/dL (ref 8–23)
CHLORIDE: 93 mmol/L — AB (ref 98–111)
CO2: 34 mmol/L — AB (ref 22–32)
CREATININE: 0.92 mg/dL (ref 0.61–1.24)
Calcium: 7.9 mg/dL — ABNORMAL LOW (ref 8.9–10.3)
GFR calc non Af Amer: >60 mL/min (ref >60)
GLUCOSE: 91 mg/dL (ref 70–99)
Potassium: 3.6 mmol/L (ref 3.5–5.1)
SODIUM: 136 mmol/L (ref 135–145)

## 2018-08-15 NOTE — Progress Notes (Signed)
PROGRESS NOTE  [name]  DJS:[id number] DOB: 10-23-1946 DOA: 08/12/2018 PCP: Bernerd Limbo, MD   Brief Narrative: Jesus Daniel is a 72 y.o. male with a history of alcohol abuse, chronic inoperable right hip infection, GERD, HTN, gout, and depression with anxiety who presented to the ED from home due to shortness of breath, leg swelling, and gurgling breath sounds. He was hospitalized 8/5-8/8 for frequent falls, skin tears, electrolyte disturbances and AKI, discharged to SNF and progressed some with physical therapy, discharged home on 8/31. Since that time he had been not eating much and very sedentary but not drinking alcohol. Unclear if he was taking lasix. He began having swelling and dyspnea, so presented to the ED, found to be volume overloaded, started on IV lasix. Symptoms have improved and SNF (vs. 24 hour home supervision) is again recommended. The patient and son, his primary caretaker, fear that the cycle will recur with failure to thrive after discharge.   Assessment & Plan: Active Problems:   HTN (hypertension)   GERD (gastroesophageal reflux disease)   Prosthetic hip infection (HCC)   HLD (hyperlipidemia)   Hypokalemia   Multiple skin tears   Acute CHF (congestive heart failure) (HCC)   Anasarca   Hypoalbuminemia due to protein-calorie malnutrition (HCC)   Adult failure to thrive   Protein-calorie malnutrition, severe  Acute on chronic HFpEF: EF 55-60%, mild-moderate AS.  - Continue IV lasix BID. Continues to have peripheral edema. Wt down and symptomatically improving. BUN:Cr remains ~10:1.  - Daily weights. Unclear on dry weight. - Continue I/O, has condom cath. - Heart healthy diet - Cardiology follow up as outpatient  Troponin elevation: Mild due to demand from CHF, no chest pain, ECG without ischemic changes.  - ASA, statin. BP too soft for BB.   Dyslipidemia:  - Statin  EtOH abuse: Has not had a drink since just prior to previous hospitalization (>1 month  ago). No evidence of withdrawal. - Folate, thiamine,MVM's  Chronicprostheticright hip infection: Not a surgical candidate. This is impacting his functional mobility and quality of life significantly.  - Continue po doxycycline - Continue judicious pain control (sees Dr. Hardin Negus, pain management). - Follow up with Dr. Johnnye Sima  Hypokalemia: Worsened by loop diuretic - Replace and recheck  Frequent falls: Continued problem, though no new wounds. Continue to be more and more sedentary.  - PT/OT consulted, recommending 24 hour supervision or SNF. Pt feels he would not benefit from SNF placement so his son is attempting to arrange 24 hr care at home.   Skin avulsions: Improving.  - WOC consulted.   Moderate protein calorie malnutrition:  - Protein supplementation  Macrocytic anemia: Stable in 7's since last admission. TSH wnl.  - Monitor, transfuse as needed. - Supplement folate  HTN:  - Monitor, currently soft BPs, holding lasix.   GERD:  - PPI as above  Hyperlipidemia:  - Continue statin  DVT prophylaxis: Lovenox Code Status: Full Family Communication: Son at bedside Disposition Plan: Likely 48-72 hours until euvolemic. With readmission and ongoing poor po intake, poor mobilization I suspect the patient may continue to fail to thrive no matter where he goes. I've discussed with with the patient and son who feel that is probable. Will ask for palliative care to help discuss goals for this patient. Perhaps palliative/hospice could follow him at home.   Consultants:   Palliative care team  Procedures:  TTE - - Left ventricle: The cavity size was normal. Wall thickness was increased in a pattern  of mild LVH. Systolic function was normal. The estimated ejection fraction was in the range of 55% to 60%. Wall motion was normal; there were no regional wall motion abnormalities. - Aortic valve: The aortic valve visually appears to have reduced cusp separation but gradient  is not high. suggest clinical correlation as degree of stenosis may be under estimated. Cusp separation was reduced. Valve mobility was moderately restricted. Transvalvular velocity was increased. There was mild to moderate stenosis. Valve area (VTI): 1.45 cm^2. Valve area (Vmean): 1.31 cm^2. - Mitral valve: Mildly calcified annulus. Mildly calcified leaflets.. - Left atrium: The atrium was severely dilated.  Antimicrobials:  None   Subjective: Breathing is better, but still short of breath with exertion. Pain is controlled. Swelling in legs is worse than usual but improved from earlier in this admission.  Objective: Vitals:   08/15/18 0355 08/15/18 0400 08/15/18 0409 08/15/18 0800  BP: (!) 85/65 (!) 90/57  96/63  Pulse:    95  Resp:    (!) 28  Temp: 98.4 F (36.9 C)     TempSrc: Oral     SpO2: 91%   90%  Weight:   98.4 kg   Height:        Intake/Output Summary (Last 24 hours) at 08/15/2018 1157 Last data filed at 08/15/2018 0958 Gross per 24 hour  Intake 600 ml  Output 1901 ml  Net -1301 ml   Filed Weights   08/13/18 2148 08/14/18 0454 08/15/18 0409  Weight: 98.6 kg 97.4 kg 98.4 kg    Gen: Chronically ill-appearing male in no distress Pulm: Non-labored breathing, decreased at bases with scant bibasilar crackles. CV: Regular rate and rhythm. No murmur, rub, or gallop. No JVD, 1+ LE edema extending to thighs. GI: Abdomen soft, non-tender, non-distended, with normoactive bowel sounds. No organomegaly or masses felt. Ext: Warm, no deformities. Bilateral lower legs wrapped. Skin: No rashes, lesions or ulcers outside lower leg covered wounds. Neuro: Alert and oriented. No focal neurological deficits. No asterixis Psych: Judgement and insight appear fair. Mood & affect appropriate.   Data Reviewed: I have personally reviewed following labs and imaging studies  CBC: Recent Labs  Lab 08/12/18 1517 08/13/18 0615 08/14/18 0353 08/15/18 0230  WBC 7.2 6.8 5.0 5.5    NEUTROABS 5.2  --   --   --   HGB 9.0* 7.8* 7.4* 7.3*  HCT 29.7* 24.9* 23.4* 23.5*  MCV 103.5* 100.8* 98.7 100.0  PLT 231 220 218 789   Basic Metabolic Panel: Recent Labs  Lab 08/12/18 1517 08/12/18 1919 08/13/18 0615 08/13/18 1023 08/14/18 0353 08/15/18 0230  NA 136  --  138  --  137 136  K 3.1*  --  3.2*  --  3.3* 3.6  CL 97*  --  98  --  95* 93*  CO2 29  --  29  --  31 34*  GLUCOSE 118*  --  96  --  86 91  BUN 8  --  6*  --  5* 10  CREATININE 0.87  --  0.80  --  0.82 0.92  CALCIUM 8.1*  --  7.7*  --  7.9* 7.9*  MG  --  1.2*  --  1.5* 1.7  --    GFR: Estimated Creatinine Clearance: 83.8 mL/min (by C-G formula based on SCr of 0.92 mg/dL). Liver Function Tests: Recent Labs  Lab 08/12/18 1517 08/13/18 0615  AST 41 30  ALT 19 17  ALKPHOS 122 91  BILITOT 0.7 0.9  PROT  6.1* 5.3*  ALBUMIN 2.1* 1.8*   No results for input(s): LIPASE, AMYLASE in the last 168 hours. No results for input(s): AMMONIA in the last 168 hours. Coagulation Profile: Recent Labs  Lab 08/12/18 1919  INR 1.10   Cardiac Enzymes: Recent Labs  Lab 08/12/18 1919 08/12/18 2358 08/13/18 0615  TROPONINI 0.13* 0.14* 0.11*   BNP (last 3 results) No results for input(s): PROBNP in the last 8760 hours. HbA1C: No results for input(s): HGBA1C in the last 72 hours. CBG: No results for input(s): GLUCAP in the last 168 hours. Lipid Profile: No results for input(s): CHOL, HDL, LDLCALC, TRIG, CHOLHDL, LDLDIRECT in the last 72 hours. Thyroid Function Tests: Recent Labs    08/14/18 1226  TSH 3.525   Anemia Panel: Recent Labs    08/13/18 1407 08/13/18 1411  VITAMINB12  --  876  FOLATE 35.2  --   FERRITIN  --  102  TIBC  --  168*  IRON  --  22*  RETICCTPCT  --  2.8   Urine analysis:    Component Value Date/Time   COLORURINE STRAW (A) 08/12/2018 1518   APPEARANCEUR CLEAR 08/12/2018 1518   LABSPEC 1.006 08/12/2018 1518   PHURINE 5.0 08/12/2018 1518   GLUCOSEU NEGATIVE 08/12/2018 1518    HGBUR NEGATIVE 08/12/2018 1518   BILIRUBINUR NEGATIVE 08/12/2018 1518   KETONESUR NEGATIVE 08/12/2018 1518   PROTEINUR NEGATIVE 08/12/2018 1518   NITRITE NEGATIVE 08/12/2018 1518   LEUKOCYTESUR NEGATIVE 08/12/2018 1518   Recent Results (from the past 240 hour(s))  Blood Culture (routine x 2)     Status: None (Preliminary result)   Collection Time: 08/12/18  3:10 PM  Result Value Ref Range Status   Specimen Description BLOOD BLOOD RIGHT FOREARM  Final   Special Requests   Final    BOTTLES DRAWN AEROBIC AND ANAEROBIC Blood Culture adequate volume   Culture   Final    NO GROWTH 3 DAYS Performed at [facility], [place] [address] [place], [place] [address]    Report Status PENDING  Incomplete  Blood Culture (routine x 2)     Status: None (Preliminary result)   Collection Time: 08/12/18  3:55 PM  Result Value Ref Range Status   Specimen Description BLOOD BLOOD RIGHT FOREARM  Final   Special Requests   Final    BOTTLES DRAWN AEROBIC AND ANAEROBIC Blood Culture adequate volume   Culture   Final    NO GROWTH 3 DAYS Performed at [facility], [place] [address], [place], [place] [address]    Report Status PENDING  Incomplete  MRSA PCR Screening     Status: None   Collection Time: 08/12/18  6:59 PM  Result Value Ref Range Status   MRSA by PCR NEGATIVE NEGATIVE Final    Comment:        The GeneXpert MRSA Assay (FDA approved for NASAL specimens only), is one component of a comprehensive MRSA colonization surveillance program. It is not intended to diagnose MRSA infection nor to guide or monitor treatment for MRSA infections. Performed at [facility], [place] [address] [place], [place] [address]       Radiology Studies: No results found.  Scheduled Meds: . aspirin  81 mg Oral Daily  . atorvastatin  10 mg Oral Daily  . doxycycline  100 mg Oral Q12H  . enoxaparin (LOVENOX) injection  40 mg Subcutaneous Q24H  . feeding supplement (ENSURE ENLIVE)  237 mL Oral BID BM    .  feeding supplement (PRO-STAT SUGAR FREE 64)  30 mL Oral TID WC  . ferrous sulfate  325 mg Oral TID WC  . folic acid  1 mg Oral Daily  . furosemide  60 mg Intravenous BID  . magnesium oxide  400 mg Oral Daily  . multivitamin with minerals  1 tablet Oral Q breakfast  . potassium chloride  40 mEq Oral BID  . sodium chloride flush  3 mL Intravenous Q12H  . thiamine  100 mg Oral Daily   Continuous Infusions:   LOS: 3 days   Time spent: 25 minutes.  Patrecia Pour, MD [facility] [email or web address] Password [email or web address] 08/15/2018, 11:57 AM

## 2018-08-15 NOTE — Plan of Care (Signed)
  Problem: Clinical Measurements: Goal: Ability to maintain clinical measurements within normal limits will improve Outcome: Progressing   Problem: Health Behavior/Discharge Planning: Goal: Ability to manage health-related needs will improve Outcome: Not Progressing   Problem: Activity: Goal: Risk for activity intolerance will decrease Outcome: Not Progressing

## 2018-08-15 NOTE — Consult Note (Signed)
Consultation Note Date: 08/15/2018   Patient Name: Jesus Daniel  DOB: 04-12-1946  MRN: [id number]  Age / Sex: 72 y.o., male  PCP: Bernerd Limbo, MD Referring Physician: Patrecia Pour, MD  Reason for Consultation: Establishing goals of care, Hospice Evaluation and Psychosocial/spiritual support  HPI/Patient Profile: 72 y.o. male  with past medical history of GERD, gout, hypertension, alcohol use disorder (last usage of alcohol reportedly 07/13/2018), chronic right hip pain, chronic right hip prosthetic infection, frequent falls admitted on 08/12/2018 with shortness of breath and generalized edema.  Patient was just admitted to the hospital from 07/13/2018 to 07/16/2018 and was discharged to [facility], skilled nursing facility for rehab.  From there he went home on August 08, 2018.  At that point he reportedly had quit eating, being as mobile as well as continue to experience falls.  Consult ordered for goals of care and hospice evaluation.   Clinical Assessment and Goals of Care: Patient seen, chart reviewed.  Patient is alert and oriented x3 and able to participate in goals of care discussion.  He is hopeful to return to his home with paid caregivers in the house.  He does not wish to pursue rehab again and does not feel that it was helpful in the past.  Per chart review, attending has been in discussion with sons regarding additional help in the home.  Patient overall describes his health as fair despite his recent hospitalizations, frequent falls and functional decline.  He does acknowledge that he is having trouble cooking for himself and has limited mobility within his own home in terms of making it to the bathroom, living room, kitchen.  He reports pain to his right hip limits his mobility and is causing him to fall.   At this point, pt is able to make his own healthcare decisions.  Were he unable to, he has 2 sons,  Mayo Faulk at [phone]; Aundria Rud at [phone] who would act as his healthcare proxy's.  In addition to the goal of returning to his own home he states what would improve his quality of life is just to be able to get out of the house and go for a drive and he would like to go to the beach and go fishing.  He feels like with a little assistance, improved nutrition he will be able to accomplish this.  Introduced palliative medicine services as a specialized form of medical care for people with serious illness, appropriate at any age and any stage of illness.  Palliative medicine is directed at care in alignment with patient's goals and improving quality of life for both patient and the family.  I did contrast this with hospice services that also addresses patient goals and improving quality of life but is tied to a potential prognosis of less than 6 months.   Patient shares with me that he does have a living will and he has shared with his sons what his advanced care wishes are.  We did talk about full code and DNR and  defined those interventions.  Patient states that he would "want you to bring me back if there is any hope".  We talked further about this and he did clarify that he is a full code by choice, specifically would undergo CPR defibrillation and intubation in order to save his life    SUMMARY OF RECOMMENDATIONS   Full code, full scope of treatment. The patient is quite ill with multiple comorbidities as well as functional decline but it is doubtful  that he would meet his hospice Medicare benefit nor does it seem like a benefit that he wishes to pursue at this point.  He does have significant protein calorie malnutrition with an albumin of 1.8 with ongoing MRSA infection to his hip but he is still eating, mobile to some degree, and likely has a prognosis of longer than 6 months Palliative medicine would be happy to reach out to local hospice to see if he would meet criteria, but  again this does not seem to be in keeping with patient's goals He would benefit from ongoing goals of care discussion in the community.  Those services can be accessed through, care connection at [phone], or hospice and palliative care of [place] [place]'s palliative medicine division at [phone] Code Status/Advance Care Planning:  Full code    Symptom Management:   Hip pain: Continue with Tylenol at moderate to low doses only, low-dose oxycodone 2.5 mg twice daily as needed.  Patient is followed by pain clinic  Palliative Prophylaxis:   Aspiration, Bowel Regimen, Eye Care, Frequent Pain Assessment, Oral Care and Turn Reposition  Additional Recommendations (Limitations, Scope, Preferences):  Full Scope Treatment  Psycho-social/Spiritual:   Desire for further Chaplaincy support:no  Additional Recommendations: Referral to Community Resources   Prognosis:   Unable to determine  Discharge Planning: Home with Home Health      Primary Diagnoses: Present on Admission: . Anasarca . Hypoalbuminemia due to protein-calorie malnutrition ([place]) . Adult failure to thrive . GERD (gastroesophageal reflux disease) . HLD (hyperlipidemia) . HTN (hypertension) . Hypokalemia . Multiple skin tears   I have reviewed the medical record, interviewed the patient and family, and examined the patient. The following aspects are pertinent.  Past Medical History:  Diagnosis Date  . Alcohol use disorder   . Anemia of chronic disease   . Anxiety   . Arthritis    "probably in my knees" (10/18/2015)  . Bilateral edema of lower extremity   . BPH (benign prostatic hyperplasia)   . Cancer ([place])    skin- melanoma.   . Depression   . GERD (gastroesophageal reflux disease)   . History of gout   . Hypokalemia   . MRSA (methicillin resistant staph aureus) culture positive 03-2016   Right hip abscess  . Physical deconditioning   . Protein calorie malnutrition ([place])   . Slow transit  constipation   . Urinary hesitancy    Social History   Socioeconomic History  . Marital status: Widowed    Spouse name: Not on file  . Number of children: Not on file  . Years of education: Not on file  . Highest education level: Not on file  Occupational History  . Not on file  Social Needs  . Financial resource strain: Not on file  . Food insecurity:    Worry: Not on file    Inability: Not on file  . Transportation needs:    Medical: Not on file    Non-medical: Not on file  Tobacco Use  . Smoking  status: Never Smoker  . Smokeless tobacco: Current User    Types: Snuff  Substance and Sexual Activity  . Alcohol use: Yes    Alcohol/week: 6.0 standard drinks    Types: 6 Cans of beer per week    Comment: every day  . Drug use: No  . Sexual activity: Never  Lifestyle  . Physical activity:    Days per week: Not on file    Minutes per session: Not on file  . Stress: Not on file  Relationships  . Social connections:    Talks on phone: Not on file    Gets together: Not on file    Attends religious service: Not on file    Active member of club or organization: Not on file    Attends meetings of clubs or organizations: Not on file    Relationship status: Not on file  Other Topics Concern  . Not on file  Social History Narrative  . Not on file   Family History  Problem Relation Age of Onset  . Stroke Mother   . Pneumonia Father   . Skin cancer Father    Scheduled Meds: . aspirin  81 mg Oral Daily  . atorvastatin  10 mg Oral Daily  . doxycycline  100 mg Oral Q12H  . enoxaparin (LOVENOX) injection  40 mg Subcutaneous Q24H  . feeding supplement (ENSURE ENLIVE)  237 mL Oral BID BM  . feeding supplement (PRO-STAT SUGAR FREE 64)  30 mL Oral TID WC  . ferrous sulfate  325 mg Oral TID WC  . folic acid  1 mg Oral Daily  . furosemide  60 mg Intravenous BID  . magnesium oxide  400 mg Oral Daily  . multivitamin with minerals  1 tablet Oral Q breakfast  . potassium chloride   40 mEq Oral BID  . sodium chloride flush  3 mL Intravenous Q12H  . thiamine  100 mg Oral Daily   Continuous Infusions: PRN Meds:.acetaminophen **OR** [DISCONTINUED] acetaminophen, albuterol, ALPRAZolam, guaiFENesin-dextromethorphan, methocarbamol, oxyCODONE, phenol, polyethylene glycol, zolpidem Medications Prior to Admission:  Prior to Admission medications   Medication Sig Start Date End Date Taking? Authorizing Provider  acetaminophen (TYLENOL) 500 MG tablet Take 500 mg by mouth every 6 (six) hours as needed for mild pain.    Yes [provider]  albuterol (PROVENTIL) (2.5 MG/3ML) 0.083% nebulizer solution Take 2.5 mg by nebulization every 4 (four) hours as needed for wheezing or shortness of breath.  08/09/18  Yes [provider]  atorvastatin (LIPITOR) 10 MG tablet Take 10 mg by mouth daily. 04/09/17  Yes [provider]  doxycycline (VIBRA-TABS) 100 MG tablet TAKE 1 TABLET (100 MG TOTAL) BY MOUTH 2 (TWO) TIMES DAILY. Patient taking differently: Take 100 mg by mouth 2 (two) times daily.  07/09/18  Yes Campbell Riches, MD  furosemide (LASIX) 40 MG tablet Take 40 mg by mouth 2 (two) times daily as needed for fluid.  08/08/15  Yes [provider]  hydrOXYzine (ATARAX/VISTARIL) 10 MG tablet Take 1 tablet (10 mg total) by mouth every 6 (six) hours as needed for itching. 04/05/16  Yes Liberty Handy, MD  methocarbamol (ROBAXIN) 500 MG tablet Take 500 mg by mouth every 8 (eight) hours as needed for muscle spasms.   Yes [provider]  oxyCODONE (OXY IR/ROXICODONE) 5 MG immediate release tablet Take 0.5 tablets (2.5 mg total) by mouth 2 (two) times daily as needed for moderate pain. *Hold for sedation* 07/16/18  Yes Vance Gather  B, MD  potassium chloride (K-DUR) 10 MEQ tablet Take 1 tablet (10 mEq total) by mouth daily. 07/16/18  Yes Patrecia Pour, MD  feeding supplement, ENSURE ENLIVE, (ENSURE ENLIVE) LIQD Take 237 mLs by mouth 2 (two) times daily between  meals. Patient not taking: Reported on 08/12/2018 07/16/18   Patrecia Pour, MD  folic acid (FOLVITE) 1 MG tablet Take 1 tablet (1 mg total) by mouth daily. 07/16/18 07/16/19  Patrecia Pour, MD  magnesium oxide (MAG-OX) 400 MG tablet Take 1 tablet (400 mg total) by mouth daily. 07/16/18   Patrecia Pour, MD  thiamine (VITAMIN B-1) 100 MG tablet Take 1 tablet (100 mg total) by mouth daily. 07/16/18   Patrecia Pour, MD   No Known Allergies Review of Systems  Constitutional: Positive for activity change, appetite change and unexpected weight change.  HENT: Negative.   Eyes: Negative.   Respiratory: Positive for cough and shortness of breath.   Cardiovascular: Positive for leg swelling.  Gastrointestinal: Positive for abdominal distention.  Endocrine: Negative.   Genitourinary: Negative.   Musculoskeletal:       Right hip pain  Allergic/Immunologic: Negative.   Neurological: Positive for weakness.  Hematological: Bruises/bleeds easily.  Psychiatric/Behavioral: Positive for dysphoric mood.    Physical Exam  Constitutional: He is oriented to person, place, and time. He appears well-developed.  Ill appearing older man in no acute distress  HENT:  Head: Normocephalic and atraumatic.  Neck: Normal range of motion.  Cardiovascular: Normal rate.  Pulmonary/Chest: Effort normal.  Musculoskeletal: Normal range of motion.  Neurological: He is alert and oriented to person, place, and time.  Skin: Skin is warm and dry.  Multiple areas of bruising to both upper extremities as well as to lower extremities  Psychiatric: He has a normal mood and affect. His behavior is normal. Judgment and thought content normal.  Poor insight into his current health issues   Nursing note and vitals reviewed.   Vital Signs: BP 98/64 (BP Location: Right Arm)   Pulse 99   Temp 97.7 F (36.5 C) (Oral)   Resp (!) 32   Ht 5\' 8"  (1.727 m)   Wt 98.4 kg   SpO2 93%   BMI 32.98 kg/m  Pain Scale: 0-10   Pain Score: 3     SpO2: SpO2: 93 % O2 Device:SpO2: 93 % O2 Flow Rate: .O2 Flow Rate (L/min): 1 L/min  IO: Intake/output summary:   Intake/Output Summary (Last 24 hours) at 08/15/2018 1629 Last data filed at 08/15/2018 1316 Gross per 24 hour  Intake 1880 ml  Output 1776 ml  Net 104 ml    LBM: Last BM Date: 08/14/18 Baseline Weight: Weight: 95.3 kg Most recent weight: Weight: 98.4 kg     Palliative Assessment/Data:   Flowsheet Rows     Most Recent Value  Intake Tab  Referral Department  Hospitalist  Unit at Time of Referral  Intermediate Care Unit  Palliative Care Primary Diagnosis  Cardiac  Date Notified  08/15/18  Reason for referral  Clarify Goals of Care, Psychosocial or Spiritual support  Date of Admission  08/15/18  Date first seen by Palliative Care  08/15/18  # of days Palliative referral response time  0 Day(s)  # of days IP prior to Palliative referral  0  Clinical Assessment  Palliative Performance Scale Score  40%  Pain Max last 24 hours  Not able to report  Pain Min Last 24 hours  Not able to report  Dyspnea  Max Last 24 Hours  Not able to report  Dyspnea Min Last 24 hours  Not able to report  Nausea Max Last 24 Hours  Not able to report  Nausea Min Last 24 Hours  Not able to report  Anxiety Max Last 24 Hours  Not able to report  Anxiety Min Last 24 Hours  Not able to report  Other Max Last 24 Hours  Not able to report  Psychosocial & Spiritual Assessment  Palliative Care Outcomes  Patient/Family meeting held?  Yes  Who was at the meeting?  pt      Time In: 1500 Time Out: 1610 Time Total: 70 min Greater than 50%  of this time was spent counseling and coordinating care related to the above assessment and plan.  Signed by: Dory Horn, NP   Please contact Palliative Medicine Team phone at [phone] for questions and concerns.  For individual provider: See Shea Evans

## 2018-08-16 LAB — BASIC METABOLIC PANEL
Anion gap: 11 (ref 5–15)
BUN: 16 mg/dL (ref 8–23)
CALCIUM: 8.2 mg/dL — AB (ref 8.9–10.3)
CO2: 33 mmol/L — AB (ref 22–32)
CREATININE: 0.91 mg/dL (ref 0.61–1.24)
Chloride: 93 mmol/L — ABNORMAL LOW (ref 98–111)
GFR calc non Af Amer: >60 mL/min (ref >60)
Glucose, Bld: 85 mg/dL (ref 70–99)
Potassium: 4 mmol/L (ref 3.5–5.1)
Sodium: 137 mmol/L (ref 135–145)

## 2018-08-16 MED ORDER — POTASSIUM CHLORIDE CRYS ER 20 MEQ PO TBCR
40.0000 meq | EXTENDED_RELEASE_TABLET | Freq: Every day | ORAL | Status: DC
  Administered 2018-08-16: 40 meq via ORAL
  Filled 2018-08-16: qty 2

## 2018-08-16 NOTE — Progress Notes (Signed)
PROGRESS NOTE  Jesus Daniel  IZT:[id number] DOB: 06/28/1946 DOA: 08/12/2018 PCP: Bernerd Limbo, MD   Brief Narrative: Jesus Daniel is a 72 y.o. male with a history of alcohol abuse, chronic inoperable right hip infection, GERD, HTN, gout, and depression with anxiety who presented to the ED from home due to shortness of breath, leg swelling, and gurgling breath sounds. He was hospitalized 8/5-8/8 for frequent falls, skin tears, electrolyte disturbances and AKI, discharged to SNF and progressed some with physical therapy, discharged home on 8/31. Since that time he had been not eating much and very sedentary but not drinking alcohol. Unclear if he was taking lasix. He began having swelling and dyspnea, so presented to the ED, found to be volume overloaded, started on IV lasix. Symptoms have improved and SNF (vs. 24 hour home supervision) is again recommended. The patient and son, his primary caretaker, fear that the cycle will recur with failure to thrive after discharge.   Assessment & Plan: Active Problems:   HTN (hypertension)   GERD (gastroesophageal reflux disease)   Prosthetic hip infection (HCC)   HLD (hyperlipidemia)   Hypokalemia   Multiple skin tears   Acute CHF (congestive heart failure) (HCC)   Anasarca   Hypoalbuminemia due to protein-calorie malnutrition (HCC)   Adult failure to thrive   Protein-calorie malnutrition, severe   Goals of care, counseling/discussion   Palliative care by specialist  Acute on chronic HFpEF: EF 55-60%, mild-moderate AS.  - Continue lasix 60mg  IV BID. Continues to have peripheral edema. Wt down and symptomatically improving. BUN:Cr is widening out, so will likely transition to po diuretic in AM.  - Daily weights. Unclear on dry weight. - Continue I/O, has condom cath. - Heart healthy diet - Cardiology follow up as outpatient  Troponin elevation: Mild due to demand from CHF, no chest pain, ECG without ischemic changes.  - ASA, statin. BP too soft  for BB.   Dyslipidemia:  - Statin  EtOH abuse: Has not had a drink since just prior to previous hospitalization (>1 month ago). No evidence of withdrawal. - Folate, thiamine,MVM's  Chronicprostheticright hip infection: Not a surgical candidate. This is impacting his functional mobility and quality of life significantly.  - Continue po doxycycline - Continue judicious pain control (sees Dr. Hardin Negus, pain management). - Follow up with Dr. Johnnye Sima as outpatient  Hypokalemia: Worsened by loop diuretic but improved with repletion. - Continue replacement daily and recheck in AM  Frequent falls: Continued problem, though no new wounds. Continue to be more and more sedentary.  - PT/OT consulted, recommending 24 hour supervision or SNF. Pt feels he would not benefit from SNF placement and does not have funds to pay $160 daily copay (recently had >21 day stay) so his son is attempting to arrange 24 hr care at home.   Skin avulsions: Improving.  - WOC consulted.   Moderate protein calorie malnutrition:  - Protein supplementation  Macrocytic anemia: Stable in 7's since last admission. TSH wnl.  - Monitor, recheck in AM, transfuse as needed if < 7g/dl. - Supplement folate  HTN:  - Monitor, currently soft BPs, holding lasix.   GERD:  - PPI as above  Hyperlipidemia:  - Continue statin  DVT prophylaxis: Lovenox Code Status: Full Family Communication: None at bedside this AM Disposition Plan: Likely 24-48 hours until euvolemic, then plan DC home. Family actively arranging 24 hour care.   Consultants:   Palliative care team  Procedures:  TTE - - Left ventricle: The cavity  size was normal. Wall thickness was increased in a pattern of mild LVH. Systolic function was normal. The estimated ejection fraction was in the range of 55% to 60%. Wall motion was normal; there were no regional wall motion abnormalities. - Aortic valve: The aortic valve visually appears to have reduced  cusp separation but gradient is not high. suggest clinical correlation as degree of stenosis may be under estimated. Cusp separation was reduced. Valve mobility was moderately restricted. Transvalvular velocity was increased. There was mild to moderate stenosis. Valve area (VTI): 1.45 cm^2. Valve area (Vmean): 1.31 cm^2. - Mitral valve: Mildly calcified annulus. Mildly calcified leaflets.. - Left atrium: The atrium was severely dilated.  Antimicrobials:  None   Subjective: No new complaints. Says swelling is much better than at admission but still significantly more than usual. This is limiting his mobility.    Objective: Vitals:   08/15/18 1600 08/15/18 2002 08/16/18 0412 08/16/18 0832  BP: 95/61 99/65 95/71  (!) 103/58  Pulse: 99 99 96 92  Resp: (!) 33 (!) 21 20 19   Temp:  98 F (36.7 C) 99.5 F (37.5 C) 98.3 F (36.8 C)  TempSrc:  Oral Oral Oral  SpO2: 95% 93% 95% 96%  Weight:   93.9 kg   Height:        Intake/Output Summary (Last 24 hours) at 08/16/2018 1305 Last data filed at 08/16/2018 1100 Gross per 24 hour  Intake 400 ml  Output 3500 ml  Net -3100 ml   Filed Weights   08/14/18 0454 08/15/18 0409 08/16/18 0412  Weight: 97.4 kg 98.4 kg 93.9 kg   Gen: Chronically ill-appearing male in no distress Pulm: Nonlabored breathing room air. Clear. CV: Regular rate and rhythm. No murmur, rub, or gallop. No JVD, trace-to-1+ dependent edema. GI: Abdomen soft, non-tender, non-distended, with normoactive bowel sounds.  Ext: Warm, no deformities Skin: Lower legs w/ACE wrap, no surrounding erythema. No other rashes, lesions or ulcers on visualized skin.  Neuro: Alert and oriented. No focal neurological deficits. Psych: Judgement and insight appear fair. Mood euthymic & affect congruent. Behavior is appropriate.    Data Reviewed: I have personally reviewed following labs and imaging studies  CBC: Recent Labs  Lab 08/12/18 1517 08/13/18 0615 08/14/18 0353 08/15/18 0230    WBC 7.2 6.8 5.0 5.5  NEUTROABS 5.2  --   --   --   HGB 9.0* 7.8* 7.4* 7.3*  HCT 29.7* 24.9* 23.4* 23.5*  MCV 103.5* 100.8* 98.7 100.0  PLT 231 220 218 762   Basic Metabolic Panel: Recent Labs  Lab 08/12/18 1517 08/12/18 1919 08/13/18 0615 08/13/18 1023 08/14/18 0353 08/15/18 0230 08/16/18 0436  NA 136  --  138  --  137 136 137  K 3.1*  --  3.2*  --  3.3* 3.6 4.0  CL 97*  --  98  --  95* 93* 93*  CO2 29  --  29  --  31 34* 33*  GLUCOSE 118*  --  96  --  86 91 85  BUN 8  --  6*  --  5* 10 16  CREATININE 0.87  --  0.80  --  0.82 0.92 0.91  CALCIUM 8.1*  --  7.7*  --  7.9* 7.9* 8.2*  MG  --  1.2*  --  1.5* 1.7  --   --    GFR: Estimated Creatinine Clearance: 82.8 mL/min (by C-G formula based on SCr of 0.91 mg/dL). Liver Function Tests: Recent Labs  Lab 08/12/18 1517  08/13/18 0615  AST 41 30  ALT 19 17  ALKPHOS 122 91  BILITOT 0.7 0.9  PROT 6.1* 5.3*  ALBUMIN 2.1* 1.8*   No results for input(s): LIPASE, AMYLASE in the last 168 hours. No results for input(s): AMMONIA in the last 168 hours. Coagulation Profile: Recent Labs  Lab 08/12/18 1919  INR 1.10   Cardiac Enzymes: Recent Labs  Lab 08/12/18 1919 08/12/18 2358 08/13/18 0615  TROPONINI 0.13* 0.14* 0.11*   BNP (last 3 results) No results for input(s): PROBNP in the last 8760 hours. HbA1C: No results for input(s): HGBA1C in the last 72 hours. CBG: No results for input(s): GLUCAP in the last 168 hours. Lipid Profile: No results for input(s): CHOL, HDL, LDLCALC, TRIG, CHOLHDL, LDLDIRECT in the last 72 hours. Thyroid Function Tests: Recent Labs    08/14/18 1226  TSH 3.525   Anemia Panel: Recent Labs    08/13/18 1407 08/13/18 1411  VITAMINB12  --  876  FOLATE 35.2  --   FERRITIN  --  102  TIBC  --  168*  IRON  --  22*  RETICCTPCT  --  2.8   Urine analysis:    Component Value Date/Time   COLORURINE STRAW (A) 08/12/2018 1518   APPEARANCEUR CLEAR 08/12/2018 1518   LABSPEC 1.006 08/12/2018  1518   PHURINE 5.0 08/12/2018 1518   GLUCOSEU NEGATIVE 08/12/2018 1518   HGBUR NEGATIVE 08/12/2018 1518   BILIRUBINUR NEGATIVE 08/12/2018 1518   KETONESUR NEGATIVE 08/12/2018 1518   PROTEINUR NEGATIVE 08/12/2018 1518   NITRITE NEGATIVE 08/12/2018 1518   LEUKOCYTESUR NEGATIVE 08/12/2018 1518   Recent Results (from the past 240 hour(s))  Blood Culture (routine x 2)     Status: None (Preliminary result)   Collection Time: 08/12/18  3:10 PM  Result Value Ref Range Status   Specimen Description BLOOD BLOOD RIGHT FOREARM  Final   Special Requests   Final    BOTTLES DRAWN AEROBIC AND ANAEROBIC Blood Culture adequate volume   Culture   Final    NO GROWTH 4 DAYS Performed at [facility], [place] [address] [place], [place] [address]    Report Status PENDING  Incomplete  Blood Culture (routine x 2)     Status: None (Preliminary result)   Collection Time: 08/12/18  3:55 PM  Result Value Ref Range Status   Specimen Description BLOOD BLOOD RIGHT FOREARM  Final   Special Requests   Final    BOTTLES DRAWN AEROBIC AND ANAEROBIC Blood Culture adequate volume   Culture   Final    NO GROWTH 4 DAYS Performed at [facility], [place] [address], [place], [place] [address]    Report Status PENDING  Incomplete  MRSA PCR Screening     Status: None   Collection Time: 08/12/18  6:59 PM  Result Value Ref Range Status   MRSA by PCR NEGATIVE NEGATIVE Final    Comment:        The GeneXpert MRSA Assay (FDA approved for NASAL specimens only), is one component of a comprehensive MRSA colonization surveillance program. It is not intended to diagnose MRSA infection nor to guide or monitor treatment for MRSA infections. Performed at [facility], [place] [address] [place],  [address]       Radiology Studies: No results found.  Scheduled Meds: . aspirin  81 mg Oral Daily  . atorvastatin  10 mg Oral Daily  . doxycycline  100 mg Oral Q12H  . enoxaparin (LOVENOX) injection  40 mg  Subcutaneous Q24H  . feeding supplement (ENSURE ENLIVE)  237 mL Oral BID BM  . feeding supplement (PRO-STAT SUGAR FREE 64)  30 mL Oral TID WC  . ferrous sulfate  325 mg Oral TID WC  . folic acid  1 mg Oral Daily  . furosemide  60 mg Intravenous BID  . magnesium oxide  400 mg Oral Daily  . multivitamin with minerals  1 tablet Oral Q breakfast  . potassium chloride  40 mEq Oral Daily  . sodium chloride flush  3 mL Intravenous Q12H  . thiamine  100 mg Oral Daily   Continuous Infusions:   LOS: 4 days   Time spent: 25 minutes.  Patrecia Pour, MD [facility] [email or web address] Password [email or web address] 08/16/2018, 1:05 PM

## 2018-08-16 NOTE — Clinical Social Work Note (Signed)
CSW acknowledges SNF consult. Per palliative note, "He is hopeful to return to his home with paid caregivers in the house.  He does not wish to pursue rehab again and does not feel that it was helpful in the past."   CSW signing off. Consult again if patient changes his mind. However, he was at [facility] for 24 days and is therefore in his copay days. He does not have secondary insurance to cover the estimated $160 per day if he returns to SNF.  Dayton Scrape, [place]

## 2018-08-17 ENCOUNTER — Inpatient Hospital Stay (HOSPITAL_COMMUNITY): Payer: PPO

## 2018-08-17 LAB — CULTURE, BLOOD (ROUTINE X 2)
CULTURE: NO GROWTH
CULTURE: NO GROWTH
SPECIAL REQUESTS: ADEQUATE
Special Requests: ADEQUATE

## 2018-08-17 LAB — CBC
HCT: 25.9 % — ABNORMAL LOW (ref 39.0–52.0)
Hemoglobin: 7.9 g/dL — ABNORMAL LOW (ref 13.0–17.0)
MCH: 30.9 pg (ref 26.0–34.0)
MCHC: 30.5 g/dL (ref 30.0–36.0)
MCV: 101.2 fL — ABNORMAL HIGH (ref 78.0–100.0)
PLATELETS: 240 10*3/uL (ref 150–400)
RBC: 2.56 MIL/uL — ABNORMAL LOW (ref 4.22–5.81)
RDW: 15.3 % (ref 11.5–15.5)
WBC: 6.1 10*3/uL (ref 4.0–10.5)

## 2018-08-17 LAB — BASIC METABOLIC PANEL
Anion gap: 10 (ref 5–15)
BUN: 21 mg/dL (ref 8–23)
CALCIUM: 8.1 mg/dL — AB (ref 8.9–10.3)
CO2: 34 mmol/L — AB (ref 22–32)
CREATININE: 0.94 mg/dL (ref 0.61–1.24)
Chloride: 94 mmol/L — ABNORMAL LOW (ref 98–111)
GFR calc Af Amer: >60 mL/min (ref >60)
GFR calc non Af Amer: >60 mL/min (ref >60)
GLUCOSE: 94 mg/dL (ref 70–99)
Potassium: 3.2 mmol/L — ABNORMAL LOW (ref 3.5–5.1)
Sodium: 138 mmol/L (ref 135–145)

## 2018-08-17 MED ORDER — SODIUM CHLORIDE 0.9 % IV SOLN
1.0000 g | INTRAVENOUS | Status: DC
  Administered 2018-08-17 – 2018-08-18 (×2): 1 g via INTRAVENOUS
  Filled 2018-08-17 (×4): qty 10

## 2018-08-17 MED ORDER — POTASSIUM CHLORIDE CRYS ER 20 MEQ PO TBCR
40.0000 meq | EXTENDED_RELEASE_TABLET | Freq: Two times a day (BID) | ORAL | Status: DC
  Administered 2018-08-17 (×2): 40 meq via ORAL
  Filled 2018-08-17 (×2): qty 2

## 2018-08-17 MED ORDER — AZITHROMYCIN 500 MG PO TABS
500.0000 mg | ORAL_TABLET | Freq: Every day | ORAL | Status: DC
  Administered 2018-08-17 – 2018-08-18 (×2): 500 mg via ORAL
  Filled 2018-08-17 (×2): qty 1

## 2018-08-17 NOTE — Progress Notes (Signed)
Small halves of white pills found at patient bed side in a bag. When patient was asked about them he states " they are oxycodone and someone has been giving them to me but I don't know who." Attempted to verify pill identity with pharmacy but we were unable to do so. Pills discarded in sharps container.

## 2018-08-17 NOTE — Consult Note (Addendum)
   [facility] CM Inpatient Consult   08/17/2018  Jesus Daniel 01/22/1946 [id number]     Made aware by inpatient RNCM that patient's discharge plan is likely for home with home health instead of SNF due SNF copay.  Discussed that [place] Worker has been communicating with patient's son, Jesus Daniel, prior to hospitalization. Also discussed that patient had [facility] for aide services prior to hospitalization.  Patient remains active with [place] and [facility] Education officer, museum.   Will continue to follow for disposition plans and progression. Will update [facility] Community team as needed.    Marthenia Rolling, MSN-Ed, RN,BSN [facility] Liaison [phone]

## 2018-08-17 NOTE — Care Management Note (Addendum)
Case Management Note Marvetta Gibbons RN, BSN Unit 4E- RN Care Coordinator  [phone]  Patient Details  Name: Jesus Daniel MRN: [id number] Date of Birth: [birth year]  Subjective/Objective:   Pt admitted with acute CHF                Action/Plan: PTA pt lived at home alone, was recently at [facility] following his last hospital stay. Per PT/OT eval recommendations for return to SNF. CSW has seen pt in consult- however pt is hopeful to return home and does not want to pursue rehab again. CM also spoke with pt at bedside he is worried about copay cost to return to SNF. Call place to son -Jesus Daniel who thought staff here was setting up 24/7 assistance for home. This Probation officer clarified with son that staff here does not assist with private duty assistance for home, only [facility] needs or return to rehab. Son reports that they do want pt to return home however need direction in setting up 24/7 assistance. Info provided to son on looking at [facility].gov site for local agencies and also goggle search for private duty. Son states that he will look into this and try to have something set up for transition home. Confirmed with son that pt had [place] services with [facility] in the past and would like to  Use them again for [facility] needs if unable then open to any insurance plan network agency.  Call has been placed to [place] with [facility] for [facility] needs- awaiting confirmation of referral acceptance. -- received call back from [place] with [facility]- they are unable to accept referral at this time (they had received referral from [facility] also on release from SNF and where not able to accept at that time either due to staffing). Spoke with Jesus Daniel with [facility] who pt is active with, discussed transition plan and needs, will reach out to [facility] for [facility] needs- call made to [facility] with [facility] for [facility] referral- referral has been accepted.   Update: 1400- received call from Jesus Daniel- per conversation pt was active with [facility] PTA- call made to Jesus Daniel  pt's other son who arranged this services per [facility]. Per TC with Jesus Daniel- confirmed that pt was receiving services with [facility] for 4hr/day. Discussed with Jesus Daniel transition of care plan for 24/7 care- per Jesus Daniel he was under the impression that Palliative care services were being arranged to provide this 24/7 care. Explained to Jesus Daniel that palliative care does not arrange 24/7 private duty care however they could follow in the home if desired. Explained that if he and his brother wanted to return pt home that they could increase hrs of care with [facility] for private duty or look at return to rehab.  Per Jesus Daniel he does not think pt is in copay days for SNF- and he request face13fce meeting in the AM to further discuss transition plan/needs. Will have CSW re-check copay days/cost. Per AJonni Daniel"my dad needs to be able to take care of himself before he can leave the hospital" - this writer explained to son that is why we needed to figure out a plan for transition for pt.  Expected Discharge Date:  08/18/18               Expected Discharge Plan:  [place] In-House Referral:  Clinical Social Work  Discharge planning Services  CM Consult  Post Acute Care Choice:  Home Health Choice offered to:  Adult Children  DME Arranged:  DME Agency:     HH Arranged:  RN, PT, OT, Nurse's Aide, Social Work [employer] Agency:  [place]  Status of Service:  In process, will continue to follow  If discussed at Long Length of Stay Meetings, dates discussed:    Discharge Disposition: home/home health   Additional Comments:  Jesus Patricia, RN 08/17/2018, 12:31 PM

## 2018-08-17 NOTE — Progress Notes (Signed)
PROGRESS NOTE  ROGERIO BOUTELLE  KGM:[id number] DOB: 1946-03-30 DOA: 08/12/2018 PCP: Bernerd Limbo, MD   Brief Narrative: Jesus Daniel is a 72 y.o. male with a history of alcohol abuse, chronic inoperable right hip infection, GERD, HTN, gout, and depression with anxiety who presented to the ED from home due to shortness of breath, leg swelling, and gurgling breath sounds. He was hospitalized 8/5-8/8 for frequent falls, skin tears, electrolyte disturbances and AKI, discharged to SNF and progressed some with physical therapy, discharged home on 8/31. Since that time he had been not eating much and very sedentary but not drinking alcohol. Unclear if he was taking lasix. He began having swelling and dyspnea, so presented to the ED, found to be volume overloaded, started on IV lasix. Symptoms have improved and SNF (vs. 24 hour home supervision) is again recommended. The patient and son, his primary caretaker, fear that the cycle will recur with failure to thrive after discharge but opt for discharge to home. They are arranging 24 hours supervision in addition to care management arranged home health services. Diuresis has continued with significant improvement in edema, still some hypoxia.   Assessment & Plan: Active Problems:   HTN (hypertension)   GERD (gastroesophageal reflux disease)   Prosthetic hip infection (HCC)   HLD (hyperlipidemia)   Hypokalemia   Multiple skin tears   Acute CHF (congestive heart failure) (HCC)   Anasarca   Hypoalbuminemia due to protein-calorie malnutrition (HCC)   Adult failure to thrive   Protein-calorie malnutrition, severe   Goals of care, counseling/discussion   Palliative care by specialist  Acute on chronic HFpEF: EF 55-60%, mild-moderate AS.  - Continue lasix, got IV lasix this AM, considering change to po this PM and recheck BMP in AM.  - Daily weights, continues to go down, net 7L out. Unclear on dry weight. - Continue I/O, has condom cath. - Heart healthy  diet - Cardiology follow up as outpatient  Acute hypoxic respiratory failure: - Now that he's diuresed well, unclear why he would still need oxygen. Check CXR.   Troponin elevation: Mild due to demand from CHF, no chest pain, ECG without ischemic changes.  - ASA, statin. BP too soft for BB.   Dyslipidemia:  - Statin  EtOH abuse: Has not had a drink since just prior to previous hospitalization (>1 month ago). No evidence of withdrawal. - Folate, thiamine,MVM's  Chronicprostheticright hip infection: Not a surgical candidate. This is impacting his functional mobility and quality of life significantly.  - Continue po doxycycline - Continue judicious pain control (sees Dr. Hardin Negus, pain management). - Follow up with Dr. Johnnye Sima as outpatient  Skin tears: Continued problem, though no new wounds. Monitor.  Hypokalemia: Worsened by loop diuretic but improved with repletion. - Continue replacement, seems to need BID replacement which has been ordered. Check with Mg in AM.  Frequent falls, sedentary:   - PT/OT consulted, recommending 24 hour supervision or SNF. Pt feels he would not benefit from SNF placement and does not have funds to pay $160 daily copay (recently had >21 day stay) so his son is attempting to arrange 24 hr care at home.   Skin avulsions: Improving.  - WOC consulted.   Moderate protein calorie malnutrition:  - Protein supplementation  Macrocytic anemia: Stable in 7's since last admission. TSH wnl.  - Monitor, recheck in AM, transfuse as needed if < 7g/dl. - Supplement folate  HTN:  - Monitor, currently soft BPs, holding lasix.   GERD:  -  PPI as above  Hyperlipidemia:  - Continue statin  DVT prophylaxis: Lovenox Code Status: Full Family Communication: None at bedside this AM Disposition Plan: [place] home 9/10.  Consultants:   Palliative care team  Procedures:  TTE - - Left ventricle: The cavity size was normal. Wall thickness was  increased in a pattern of mild LVH. Systolic function was normal. The estimated ejection fraction was in the range of 55% to 60%. Wall motion was normal; there were no regional wall motion abnormalities. - Aortic valve: The aortic valve visually appears to have reduced cusp separation but gradient is not high. suggest clinical correlation as degree of stenosis may be under estimated. Cusp separation was reduced. Valve mobility was moderately restricted. Transvalvular velocity was increased. There was mild to moderate stenosis. Valve area (VTI): 1.45 cm^2. Valve area (Vmean): 1.31 cm^2. - Mitral valve: Mildly calcified annulus. Mildly calcified leaflets.. - Left atrium: The atrium was severely dilated.  Antimicrobials:  None   Subjective: Doesn't feel very short of breath, denies orthopnea, but is 88-90% on room air during discussion. Leg swelling significantly better.   Objective: Vitals:   08/16/18 0832 08/16/18 1727 08/16/18 1946 08/17/18 0435  BP: (!) 103/58 103/61 (!) 97/58 101/64  Pulse: 92 (!) 102 68 92  Resp: 19 (!) 31 (!) 31 (!) 22  Temp: 98.3 F (36.8 C) 98 F (36.7 C) 98.7 F (37.1 C) 98.2 F (36.8 C)  TempSrc: Oral Oral Oral Oral  SpO2: 96% 96% 93% 97%  Weight:    93.2 kg  Height:        Intake/Output Summary (Last 24 hours) at 08/17/2018 1358 Last data filed at 08/17/2018 1231 Gross per 24 hour  Intake 600 ml  Output 2675 ml  Net -2075 ml   Filed Weights   08/15/18 0409 08/16/18 0412 08/17/18 0435  Weight: 98.4 kg 93.9 kg 93.2 kg   Gen: 72 y.o. male in no distress Pulm: Nonlabored breathing room air. Diminished. CV: Regular rate and rhythm. No murmur, rub, or gallop. No JVD, trace dependent edema. GI: Abdomen soft, non-tender, non-distended, with normoactive bowel sounds.  Ext: Legs with ACE wraps, stable. C/D/I. Skin: No new rashes, lesions or ulcers on visualized skin.  Neuro: Alert and oriented. No focal neurological deficits. Psych: Judgement and  insight appear fair. Mood euthymic & affect congruent. Behavior is appropriate.    Data Reviewed: I have personally reviewed following labs and imaging studies  CBC: Recent Labs  Lab 08/12/18 1517 08/13/18 0615 08/14/18 0353 08/15/18 0230 08/17/18 0414  WBC 7.2 6.8 5.0 5.5 6.1  NEUTROABS 5.2  --   --   --   --   HGB 9.0* 7.8* 7.4* 7.3* 7.9*  HCT 29.7* 24.9* 23.4* 23.5* 25.9*  MCV 103.5* 100.8* 98.7 100.0 101.2*  PLT 231 220 218 221 209   Basic Metabolic Panel: Recent Labs  Lab 08/12/18 1919 08/13/18 0615 08/13/18 1023 08/14/18 0353 08/15/18 0230 08/16/18 0436 08/17/18 0414  NA  --  138  --  137 136 137 138  K  --  3.2*  --  3.3* 3.6 4.0 3.2*  CL  --  98  --  95* 93* 93* 94*  CO2  --  29  --  31 34* 33* 34*  GLUCOSE  --  96  --  86 91 85 94  BUN  --  6*  --  5* 10 16 21   CREATININE  --  0.80  --  0.82 0.92 0.91 0.94  CALCIUM  --  7.7*  --  7.9* 7.9* 8.2* 8.1*  MG 1.2*  --  1.5* 1.7  --   --   --    GFR: Estimated Creatinine Clearance: 79.8 mL/min (by C-G formula based on SCr of 0.94 mg/dL). Liver Function Tests: Recent Labs  Lab 08/12/18 1517 08/13/18 0615  AST 41 30  ALT 19 17  ALKPHOS 122 91  BILITOT 0.7 0.9  PROT 6.1* 5.3*  ALBUMIN 2.1* 1.8*   No results for input(s): LIPASE, AMYLASE in the last 168 hours. No results for input(s): AMMONIA in the last 168 hours. Coagulation Profile: Recent Labs  Lab 08/12/18 1919  INR 1.10   Cardiac Enzymes: Recent Labs  Lab 08/12/18 1919 08/12/18 2358 08/13/18 0615  TROPONINI 0.13* 0.14* 0.11*   BNP (last 3 results) No results for input(s): PROBNP in the last 8760 hours. HbA1C: No results for input(s): HGBA1C in the last 72 hours. CBG: No results for input(s): GLUCAP in the last 168 hours. Lipid Profile: No results for input(s): CHOL, HDL, LDLCALC, TRIG, CHOLHDL, LDLDIRECT in the last 72 hours. Thyroid Function Tests: No results for input(s): TSH, T4TOTAL, FREET4, T3FREE, THYROIDAB in the last 72  hours. Anemia Panel: No results for input(s): VITAMINB12, FOLATE, FERRITIN, TIBC, IRON, RETICCTPCT in the last 72 hours. Urine analysis:    Component Value Date/Time   COLORURINE STRAW (A) 08/12/2018 1518   APPEARANCEUR CLEAR 08/12/2018 1518   LABSPEC 1.006 08/12/2018 1518   PHURINE 5.0 08/12/2018 1518   GLUCOSEU NEGATIVE 08/12/2018 1518   HGBUR NEGATIVE 08/12/2018 1518   BILIRUBINUR NEGATIVE 08/12/2018 1518   KETONESUR NEGATIVE 08/12/2018 1518   PROTEINUR NEGATIVE 08/12/2018 1518   NITRITE NEGATIVE 08/12/2018 1518   LEUKOCYTESUR NEGATIVE 08/12/2018 1518   Recent Results (from the past 240 hour(s))  Blood Culture (routine x 2)     Status: None   Collection Time: 08/12/18  3:10 PM  Result Value Ref Range Status   Specimen Description BLOOD BLOOD RIGHT FOREARM  Final   Special Requests   Final    BOTTLES DRAWN AEROBIC AND ANAEROBIC Blood Culture adequate volume   Culture   Final    NO GROWTH 5 DAYS Performed at [facility], [place] [address] [place], [place] [address]    Report Status 08/17/2018 FINAL  Final  Blood Culture (routine x 2)     Status: None   Collection Time: 08/12/18  3:55 PM  Result Value Ref Range Status   Specimen Description BLOOD BLOOD RIGHT FOREARM  Final   Special Requests   Final    BOTTLES DRAWN AEROBIC AND ANAEROBIC Blood Culture adequate volume   Culture   Final    NO GROWTH 5 DAYS Performed at [facility], [place] [address], [place], [place] [address]    Report Status 08/17/2018 FINAL  Final  MRSA PCR Screening     Status: None   Collection Time: 08/12/18  6:59 PM  Result Value Ref Range Status   MRSA by PCR NEGATIVE NEGATIVE Final    Comment:        The GeneXpert MRSA Assay (FDA approved for NASAL specimens only), is one component of a comprehensive MRSA colonization surveillance program. It is not intended to diagnose MRSA infection nor to guide or monitor treatment for MRSA infections. Performed at [facility],  [place] [address] [place], [place] [address]       Radiology Studies: No results found.  Scheduled Meds: . aspirin  81 mg Oral Daily  .  atorvastatin  10 mg Oral Daily  . doxycycline  100 mg Oral Q12H  . enoxaparin (LOVENOX) injection  40 mg Subcutaneous Q24H  . feeding supplement (ENSURE ENLIVE)  237 mL Oral BID BM  . feeding supplement (PRO-STAT SUGAR FREE 64)  30 mL Oral TID WC  . ferrous sulfate  325 mg Oral TID WC  . folic acid  1 mg Oral Daily  . furosemide  60 mg Intravenous BID  . magnesium oxide  400 mg Oral Daily  . multivitamin with minerals  1 tablet Oral Q breakfast  . potassium chloride  40 mEq Oral BID  . sodium chloride flush  3 mL Intravenous Q12H  . thiamine  100 mg Oral Daily   Continuous Infusions:   LOS: 5 days   Time spent: 25 minutes.  Patrecia Pour, MD [facility] [email or web address] Password [email or web address] 08/17/2018, 1:58 PM

## 2018-08-17 NOTE — Care Management Important Message (Signed)
Important Message  Patient Details  Name: Jesus Daniel MRN: [id number] Date of Birth: May 15, 1946   Medicare Important Message Given:  Yes    Lucindy Borel P Armida Vickroy 08/17/2018, 2:20 PM

## 2018-08-17 NOTE — Progress Notes (Signed)
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 Pt with acute on chronic heart failure.    PT Comments    Pt progressing with mobility. Pt refusing SNF and plans to go home with hired caregivers. Currently pt will need assistance every time he mobilizes. Pt left up in recliner at end of session and he was requesting back to bed after 30 minutes.    Follow Up Recommendations  SNF;Supervision/Assistance - 24 hour(Pt refusing SNF so maximize [facility] services)     Equipment Recommendations  None recommended by PT(TBD next venue)    Recommendations for Other Services       Precautions / Restrictions Precautions Precautions: Fall Restrictions Weight Bearing Restrictions: No    Mobility  Bed Mobility Overal bed mobility: Needs Assistance Bed Mobility: Supine to Sit     Supine to sit: HOB elevated;Min assist     General bed mobility comments: Assist to elevate trunk into sitting  Transfers Overall transfer level: Needs assistance Equipment used: Rolling walker (2 wheeled) Transfers: Sit to/from Stand Sit to Stand: +2 physical assistance;Min assist;From elevated surface         General transfer comment: Assist to elevate hips from bed and to stabilize walker as pt grabbing onto walker to pull up.  Ambulation/Gait Ambulation/Gait assistance: Min  assist Gait Distance (Feet): 10 Feet Assistive device: Rolling walker (2 wheeled) Gait Pattern/deviations: Step-to pattern;Decreased step length - right;Decreased step length - left;Shuffle;Trunk flexed Gait velocity: decr Gait velocity interpretation: <1.31 ft/sec, indicative of household ambulator General Gait Details: Assist for balance and support. Verbal cues to stand more erect.   Stairs             Wheelchair Mobility    Modified Rankin (Stroke Patients Only)       Balance Overall balance assessment: Needs assistance Sitting-balance support: No upper extremity supported;Feet supported Sitting balance-Leahy Scale: Fair     Standing balance support: Bilateral upper extremity supported Standing balance-Leahy Scale: Poor Standing balance comment: walker and min assist for static standing                            Cognition Arousal/Alertness: Awake/alert Behavior During Therapy: WFL for tasks assessed/performed Overall Cognitive Status: No family/caregiver present to determine baseline cognitive functioning Area of Impairment: Memory                     Memory: Decreased short-term memory                Exercises      General Comments        Pertinent Vitals/Pain Faces Pain Scale: Hurts even more Pain Location: Bil LEs (R>L) Pain Descriptors / Indicators: Pressure;Tightness;Sore    Home Living                      Prior Function  PT Goals (current goals can now be found in the care plan section) Progress towards PT goals: Progressing toward goals    Frequency    Min 3X/week      PT Plan Discharge plan needs to be updated    Co-evaluation              AM-PAC PT "6 Clicks" Daily Activity  Outcome Measure  Difficulty turning over in bed (including adjusting bedclothes, sheets and blankets)?: Unable Difficulty moving from lying on back to sitting on the side of the bed? :  Unable Difficulty sitting down on and standing up from a chair with arms (e.g., wheelchair, bedside commode, etc,.)?: Unable Help needed moving to and from a bed to chair (including a wheelchair)?: A Lot Help needed walking in hospital room?: A Lot Help needed climbing 3-5 steps with a railing? : Total 6 Click Score: 8    End of Session Equipment Utilized During Treatment: Gait belt Activity Tolerance: Patient tolerated treatment well Patient left: with call bell/phone within reach;in chair;with chair alarm set Nurse Communication: Mobility status PT Visit Diagnosis: Unsteadiness on feet (R26.81)     Time: 4944-9675 PT Time Calculation (min) (ACUTE ONLY): 18 min  Charges:  $Gait Training: 8-22 mins                     [place] Pager [phone] Office [place] 08/17/2018, 2:38 PM

## 2018-08-18 LAB — BASIC METABOLIC PANEL
Anion gap: 9 (ref 5–15)
BUN: 22 mg/dL (ref 8–23)
CHLORIDE: 92 mmol/L — AB (ref 98–111)
CO2: 34 mmol/L — ABNORMAL HIGH (ref 22–32)
CREATININE: 0.86 mg/dL (ref 0.61–1.24)
Calcium: 8.2 mg/dL — ABNORMAL LOW (ref 8.9–10.3)
GFR calc Af Amer: >60 mL/min (ref >60)
GFR calc non Af Amer: >60 mL/min (ref >60)
GLUCOSE: 105 mg/dL — AB (ref 70–99)
POTASSIUM: 3.3 mmol/L — AB (ref 3.5–5.1)
Sodium: 135 mmol/L (ref 135–145)

## 2018-08-18 LAB — MAGNESIUM: Magnesium: 1.5 mg/dL — ABNORMAL LOW (ref 1.7–2.4)

## 2018-08-18 MED ORDER — MAGNESIUM SULFATE 2 GM/50ML IV SOLN
2.0000 g | Freq: Once | INTRAVENOUS | Status: AC
  Administered 2018-08-18: 2 g via INTRAVENOUS
  Filled 2018-08-18: qty 50

## 2018-08-18 MED ORDER — POTASSIUM CHLORIDE CRYS ER 20 MEQ PO TBCR
40.0000 meq | EXTENDED_RELEASE_TABLET | Freq: Three times a day (TID) | ORAL | Status: DC
  Administered 2018-08-18 – 2018-08-19 (×5): 40 meq via ORAL
  Filled 2018-08-18 (×5): qty 2

## 2018-08-18 NOTE — NC FL2 (Signed)
[place] LEVEL OF CARE SCREENING TOOL     IDENTIFICATION  Patient Name: Jesus Daniel Birthdate: 11/18/1946 Sex: male Admission Date (Current Location): 08/12/2018  [facility] and [place] Number:  Herbalist and Address:  [facility] [place], [place] [address] [place], [place] [address]      Provider Number: [id number]  Attending Physician Name and Address:  Patrecia Pour, MD  Relative Name and Phone Number:  Rodena Medin" Yusuf, Yu [phone]    Current Level of Care: Hospital Recommended Level of Care: [place] Prior Approval Number:    Date Approved/Denied:   PASRR Number: [id number]  Discharge Plan: SNF    Current Diagnoses: Patient Active Problem List   Diagnosis Date Noted  . Protein-calorie malnutrition, severe 08/15/2018  . Goals of care, counseling/discussion   . Palliative care by specialist   . Acute CHF (congestive heart failure) ([place]) 08/12/2018  . Anasarca 08/12/2018  . Hypoalbuminemia due to protein-calorie malnutrition ([place]) 08/12/2018  . Adult failure to thrive 08/12/2018  . HLD (hyperlipidemia) 07/14/2018  . BPH (benign prostatic hyperplasia) 07/14/2018  . Hypokalemia 07/14/2018  . Hyponatremia 07/14/2018  . Hypomagnesemia 07/14/2018  . Multiple skin tears 07/14/2018  . Senile purpura ([place]) 07/04/2017  . Alcohol abuse   . Fall   . Prosthetic hip infection ([place])   . Staphylococcus aureus infection   . Right hip pain 03/31/2016  . Arthritis of right hip 10/24/2015  . Primary localized osteoarthrosis of pelvic region 10/18/2015  . DJD (degenerative joint disease) 10/17/2015  . Anxiety 10/17/2015  . Depression 10/17/2015  . GERD (gastroesophageal reflux disease) 10/17/2015  . Arthritis 10/17/2015  . Melanoma of skin ([place]) 10/17/2015  . HTN (hypertension) 04/05/2012    Orientation RESPIRATION BLADDER Height & Weight     Self, Time, Situation, Place  O2(1L) External catheter, Continent  Weight: 205 lb 7.5 oz (93.2 kg) Height:  5\' 8"  (172.7 cm)  BEHAVIORAL SYMPTOMS/MOOD NEUROLOGICAL BOWEL NUTRITION STATUS      Continent Diet(heart healthy)  AMBULATORY STATUS COMMUNICATION OF NEEDS Skin   Limited Assist Verbally                         Personal Care Assistance Level of Assistance  Bathing, Feeding, Dressing Bathing Assistance: Limited assistance Feeding assistance: Independent Dressing Assistance: Limited assistance     Functional Limitations Info  Sight, Hearing, Speech Sight Info: Impaired Hearing Info: Adequate Speech Info: Adequate    SPECIAL CARE FACTORS FREQUENCY  PT (By licensed PT), OT (By licensed OT)     PT Frequency: 5x wk OT Frequency: 5x wk            Contractures Contractures Info: Not present    Additional Factors Info  Allergies, Code Status Code Status Info: full code Allergies Info: no known allergies           Current Medications (08/18/2018):  This is the current hospital active medication list Current Facility-Administered Medications  Medication Dose Route Frequency Provider Last Rate Last Dose  . acetaminophen (TYLENOL) tablet 650 mg  650 mg Oral Q6H PRN Thurnell Lose, MD   650 mg at 08/18/18 0458  . albuterol (PROVENTIL) (2.5 MG/3ML) 0.083% nebulizer solution 2.5 mg  2.5 mg Nebulization Q2H PRN Thurnell Lose, MD      . ALPRAZolam Duanne Moron) tablet 0.5 mg  0.5 mg Oral BID PRN Thurnell Lose, MD   0.5 mg at 08/18/18 0458  .  aspirin chewable tablet 81 mg  81 mg Oral Daily Thurnell Lose, MD   81 mg at 08/18/18 0959  . atorvastatin (LIPITOR) tablet 10 mg  10 mg Oral Daily Thurnell Lose, MD   10 mg at 08/18/18 0959  . cefTRIAXone (ROCEPHIN) 1 g in sodium chloride 0.9 % 100 mL IVPB  1 g Intravenous Q24H Vance Gather B, MD 200 mL/hr at 08/17/18 2010 1 g at 08/17/18 2010  . doxycycline (VIBRA-TABS) tablet 100 mg  100 mg Oral Q12H Thurnell Lose, MD   100 mg at 08/18/18 0958  . enoxaparin (LOVENOX) injection 40  mg  40 mg Subcutaneous Q24H Thurnell Lose, MD   40 mg at 08/17/18 2141  . feeding supplement (ENSURE ENLIVE) (ENSURE ENLIVE) liquid 237 mL  237 mL Oral BID BM Thurnell Lose, MD   237 mL at 08/18/18 1007  . feeding supplement (PRO-STAT SUGAR FREE 64) liquid 30 mL  30 mL Oral TID WC Thurnell Lose, MD   30 mL at 08/18/18 0957  . ferrous sulfate tablet 325 mg  325 mg Oral TID WC Thurnell Lose, MD   325 mg at 08/18/18 0959  . folic acid (FOLVITE) tablet 1 mg  1 mg Oral Daily Thurnell Lose, MD   1 mg at 08/18/18 0959  . furosemide (LASIX) injection 60 mg  60 mg Intravenous BID Thurnell Lose, MD   60 mg at 08/18/18 1001  . guaiFENesin-dextromethorphan (ROBITUSSIN DM) 100-10 MG/5ML syrup 5 mL  5 mL Oral Q4H PRN Thurnell Lose, MD   5 mL at 08/18/18 0504  . magnesium oxide (MAG-OX) tablet 400 mg  400 mg Oral Daily Thurnell Lose, MD   400 mg at 08/18/18 0959  . methocarbamol (ROBAXIN) tablet 500 mg  500 mg Oral QHS PRN Thurnell Lose, MD   500 mg at 08/13/18 2101  . multivitamin with minerals tablet 1 tablet  1 tablet Oral Q breakfast Thurnell Lose, MD   1 tablet at 08/18/18 1000  . oxyCODONE (Oxy IR/ROXICODONE) immediate release tablet 2.5 mg  2.5 mg Oral BID PRN Thurnell Lose, MD   2.5 mg at 08/18/18 1000  . phenol (CHLORASEPTIC) mouth spray 1 spray  1 spray Mouth/Throat PRN Thurnell Lose, MD   1 spray at 08/13/18 0104  . polyethylene glycol (MIRALAX / GLYCOLAX) packet 17 g  17 g Oral Daily PRN Thurnell Lose, MD      . potassium chloride SA (K-DUR,KLOR-CON) CR tablet 40 mEq  40 mEq Oral TID Patrecia Pour, MD   40 mEq at 08/18/18 0958  . sodium chloride flush (NS) 0.9 % injection 3 mL  3 mL Intravenous Q12H Thurnell Lose, MD   3 mL at 08/18/18 1005  . thiamine (VITAMIN B-1) tablet 100 mg  100 mg Oral Daily Lala Lund K, MD   100 mg at 08/18/18 1000  . zolpidem (AMBIEN) tablet 5 mg  5 mg Oral QHS PRN Thurnell Lose, MD   5 mg at 08/13/18 2215      Discharge Medications: Please see discharge summary for a list of discharge medications.  Relevant Imaging Results:  Relevant Lab Results:   Additional Information [id number]  Wende Neighbors, LCSW

## 2018-08-18 NOTE — Progress Notes (Signed)
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 Pt with acute on chronic heart failure.    PT Comments    Pt continues to require assist for all mobility. Chronic rt hip pain worse today. Pt now agreeable to ST-SNF.    Follow Up Recommendations  SNF;Supervision/Assistance - 24 hour(Pt now agreeing to SNF)     Equipment Recommendations  None recommended by PT(TBD next venue)    Recommendations for Other Services       Precautions / Restrictions Precautions Precautions: Fall Restrictions Weight Bearing Restrictions: No    Mobility  Bed Mobility Overal bed mobility: Needs Assistance Bed Mobility: Supine to Sit;Sit to Supine     Supine to sit: +2 for physical assistance;Mod assist;HOB elevated Sit to supine: +2 for physical assistance;Mod assist   General bed mobility comments: Assist to bring legs off of bed, elevate trunk into sitting, and bring hips to EOB. Assist to lower trunk and bring legs back up into bed when returning to supine.  Transfers Overall transfer level: Needs assistance Equipment used: Rolling walker (2 wheeled) Transfers: Sit to/from Stand Sit to Stand: +2 physical assistance;From elevated surface;Mod assist         General transfer comment: Assist to elevate hips from bed and to stabilize walker as pt grabbing onto  walker to pull up.  Ambulation/Gait Ambulation/Gait assistance: Min assist;Mod assist;+2 safety/equipment Gait Distance (Feet): 6 Feet Assistive device: Rolling walker (2 wheeled) Gait Pattern/deviations: Step-to pattern;Decreased step length - right;Decreased step length - left;Shuffle;Trunk flexed Gait velocity: decr Gait velocity interpretation: <1.8 ft/sec, indicate of risk for recurrent falls General Gait Details: Assist for balance and support. Pt keeps RLE externally rotated during gait.   Stairs             Wheelchair Mobility    Modified Rankin (Stroke Patients Only)       Balance Overall balance assessment: Needs assistance Sitting-balance support: No upper extremity supported;Feet supported Sitting balance-Leahy Scale: Fair     Standing balance support: Bilateral upper extremity supported Standing balance-Leahy Scale: Poor Standing balance comment: walker and min assist for static standing                            Cognition Arousal/Alertness: Awake/alert Behavior During Therapy: WFL for tasks assessed/performed Overall Cognitive Status: No family/caregiver present to determine baseline cognitive functioning Area of Impairment: Memory                     Memory: Decreased short-term memory                Exercises      General Comments General comments (skin integrity, edema, etc.): Son Jonni Sanger present throughout session      Pertinent Vitals/Pain Pain Assessment: Faces Faces Pain Scale: Hurts whole lot Pain Location: rt hip  Pain Descriptors / Indicators: Sore;Sharp Pain Intervention(s): Limited activity within patient's tolerance;Monitored during  session;Repositioned    Home Living                      Prior Function            PT Goals (current goals can now be found in the care plan section) Progress towards PT goals: Progressing toward goals    Frequency    Min 3X/week      PT Plan Discharge  plan needs to be updated    Co-evaluation              AM-PAC PT "6 Clicks" Daily Activity  Outcome Measure  Difficulty turning over in bed (including adjusting bedclothes, sheets and blankets)?: Unable Difficulty moving from lying on back to sitting on the side of the bed? : Unable Difficulty sitting down on and standing up from a chair with arms (e.g., wheelchair, bedside commode, etc,.)?: Unable Help needed moving to and from a bed to chair (including a wheelchair)?: A Lot Help needed walking in hospital room?: A Lot Help needed climbing 3-5 steps with a railing? : Total 6 Click Score: 8    End of Session Equipment Utilized During Treatment: Gait belt Activity Tolerance: No increased pain;Patient limited by fatigue Patient left: with call bell/phone within reach;in bed;with bed alarm set Nurse Communication: Mobility status PT Visit Diagnosis: Unsteadiness on feet (R26.81)     Time: 3491-7915 PT Time Calculation (min) (ACUTE ONLY): 22 min  Charges:  $Gait Training: 8-22 mins                     [place] Pager [phone] Office [place] 08/18/2018, 11:26 AM

## 2018-08-18 NOTE — Progress Notes (Signed)
   08/18/18 1100  Clinical Encounter Type  Visited With Family  Visit Type Initial  Referral From  (Doing rounds)  Consult/Referral To Chaplain  Spiritual Encounters  Spiritual Needs Emotional  Stress Factors  Family Stress Factors Exhausted   Chaplain was doing rounds and spoke to PT's family member outside room. Son mentioned his concern for his father's health and was also talking about how his busy work schedule was. Son also mentioned they had a visit from their pastor. Chaplain offered words of encouragement and Ministry of presence.

## 2018-08-18 NOTE — Progress Notes (Signed)
PROGRESS NOTE  Jesus Daniel  ACZ:[id number] DOB: 15-Aug-1946 DOA: 08/12/2018 PCP: Bernerd Limbo, MD   Brief Narrative: Jesus Daniel is a 72 y.o. male with a history of alcohol abuse, chronic inoperable right hip infection, GERD, HTN, gout, and depression with anxiety who presented to the ED from home due to shortness of breath, leg swelling, and gurgling breath sounds. He was hospitalized 8/5-8/8 for frequent falls, skin tears, electrolyte disturbances and AKI, discharged to SNF and progressed some with physical therapy, discharged home on 8/31. Since that time he had been not eating much and very sedentary but not drinking alcohol. Unclear if he was taking lasix. He began having swelling and dyspnea, so presented to the ED, found to be volume overloaded, started on IV lasix. Symptoms have improved and SNF (vs. 24 hour home supervision) was again recommended. The patient and son, his primary caretaker, fear that the cycle will recur with failure to thrive after discharge but initially opted for discharge to home, though they have reconsidered and are asking to pursue SNF placement. Diuresis has continued with significant improvement in edema. Hypoxia continued, so CXR was performed suggestive of RLL infiltrate, so ceftriaxone was started.   Assessment & Plan: Active Problems:   HTN (hypertension)   GERD (gastroesophageal reflux disease)   Prosthetic hip infection (H[facility])   HLD (hyperlipidemia)   Hypokalemia   Multiple skin tears   Acute CHF (congestive heart failure) ([facility])   Anasarca   Hypoalbuminemia due to protein-calorie malnutrition ([place])   Adult failure to thrive   Protein-calorie malnutrition, severe   Goals of care, counseling/discussion   Palliative care by specialist  Acute on chronic HFpEF: EF 55-60%, mild-moderate AS.  - Will continue IV lasix one more day with plan to transition to po 9/11. 2.9L out over past 24hrs, no weight yet today. - Continue I/O, has condom cath. - Heart  healthy diet - Cardiology follow up as outpatient  Acute hypoxic respiratory failure: Due to pulmonary edema/volume overload which has improved and possibly RLL pneumonia noted on CXR 9/9.  - Continue diuresis - Continue abx (started ceftriaxone 9/9, continue home doxycycline) - Ambulatory pulse oximetry prior to discharge.  Troponin elevation: Mild due to demand from CHF, no chest pain, ECG without ischemic changes.  - ASA, statin. BP too soft for BB.   Dyslipidemia:  - Statin  EtOH abuse: Has not had a drink since just prior to previous hospitalization (>1 month ago). No evidence of withdrawal. - Folate, thiamine,MVM's  Chronicprostheticright hip infection: Not a surgical candidate. This is impacting his functional mobility and quality of life significantly.  - Continue po doxycycline - Continue judicious pain control (sees Dr. Hardin Negus, pain management). Please note the patient's son brought his oxycodone from home for patient to take supplementally to those provided here. These were confiscated. - Follow up with Dr. Johnnye Sima as outpatient  Skin tears: Continued problem, though no new wounds. Monitor.  Hypokalemia: Worsened by loop diuretic but improved with repletion. - Continue replacement, will increase to TID today, anticipate he will require continued BID dosing. Continue Mg as well.  Frequent falls, sedentary:   - PT/OT consulted, recommending 24 hour supervision or SNF. Pt initially reluctant due to need to pay $160 daily copay (recently had >21 day stay), but now amenable to placement at alternative SNF. FL2 signed.   Skin avulsions: Improving.  - WOC consulted.   Moderate protein calorie malnutrition:  - Protein supplementation  Macrocytic anemia: Stable in 7's since last admission.  TSH wnl.  - Monitor, intermittently - Supplementing folate  HTN:  - Monitor, currently soft BPs  GERD:  - PPI as above  Hyperlipidemia:  - Continue statin  DVT  prophylaxis: Lovenox Code Status: Full Family Communication: Son Jonni Sanger at bedside this AM Disposition Plan: CSW reconsulted for SNF placement. Anticipate insurance authorization will take a day, so anticipate DC 9/11.  Consultants:   Palliative care team  Procedures:  TTE - - Left ventricle: The cavity size was normal. Wall thickness was increased in a pattern of mild LVH. Systolic function was normal. The estimated ejection fraction was in the range of 55% to 60%. Wall motion was normal; there were no regional wall motion abnormalities. - Aortic valve: The aortic valve visually appears to have reduced cusp separation but gradient is not high. suggest clinical correlation as degree of stenosis may be under estimated. Cusp separation was reduced. Valve mobility was moderately restricted. Transvalvular velocity was increased. There was mild to moderate stenosis. Valve area (VTI): 1.45 cm^2. Valve area (Vmean): 1.31 cm^2. - Mitral valve: Mildly calcified annulus. Mildly calcified leaflets.. - Left atrium: The atrium was severely dilated.  Antimicrobials:  Chronic doxycycline  Ceftriaxone 9/9 >>   Subjective: Orthopnea resolved, feels he's breathing better. No chest pain. Swelling in legs has nearly returned to baseline.    Objective: Vitals:   08/18/18 0308 08/18/18 0327 08/18/18 0800 08/18/18 1200  BP: 122/75 (!) 137/93 102/61 110/65  Pulse:   65 (!) 45  Resp:   20 20  Temp: 98.2 F (36.8 C)   98 F (36.7 C)  TempSrc: Oral   Oral  SpO2:   96% 91%  Weight:      Height:        Intake/Output Summary (Last 24 hours) at 08/18/2018 1354 Last data filed at 08/18/2018 1300 Gross per 24 hour  Intake 1260 ml  Output 2475 ml  Net -1215 ml   Filed Weights   08/15/18 0409 08/16/18 0412 08/17/18 0435  Weight: 98.4 kg 93.9 kg 93.2 kg   Gen: Chronically ill-appearing male in no distress Pulm: Nonlabored but can't speak longer sentences, diminished globally without crackles or  rhonchi. CV: Regular rate and rhythm. No murmur, rub, or gallop. No JVD, trace dependent edema. GI: Abdomen soft, non-tender, non-distended, with normoactive bowel sounds.  Ext: Warm, no deformities Skin: Lower legs with ACE wraps, no surrounding erythema.  Neuro: Alert and oriented. No focal neurological deficits. Psych: Judgement seems poor but insight appears fair. Mood euthymic & affect congruent.     Data Reviewed: I have personally reviewed following labs and imaging studies  CBC: Recent Labs  Lab 08/12/18 1517 08/13/18 0615 08/14/18 0353 08/15/18 0230 08/17/18 0414  WBC 7.2 6.8 5.0 5.5 6.1  NEUTROABS 5.2  --   --   --   --   HGB 9.0* 7.8* 7.4* 7.3* 7.9*  HCT 29.7* 24.9* 23.4* 23.5* 25.9*  MCV 103.5* 100.8* 98.7 100.0 101.2*  PLT 231 220 218 221 638   Basic Metabolic Panel: Recent Labs  Lab 08/12/18 1919  08/13/18 1023 08/14/18 0353 08/15/18 0230 08/16/18 0436 08/17/18 0414 08/18/18 0420  NA  --    < >  --  137 136 137 138 135  K  --    < >  --  3.3* 3.6 4.0 3.2* 3.3*  CL  --    < >  --  95* 93* 93* 94* 92*  CO2  --    < >  --  31 34* 33* 34* 34*  GLUCOSE  --    < >  --  86 91 85 94 105*  BUN  --    < >  --  5* 10 16 21 22   CREATININE  --    < >  --  0.82 0.92 0.91 0.94 0.86  CALCIUM  --    < >  --  7.9* 7.9* 8.2* 8.1* 8.2*  MG 1.2*  --  1.5* 1.7  --   --   --  1.5*   < > = values in this interval not displayed.   GFR: Estimated Creatinine Clearance: 87.3 mL/min (by C-G formula based on SCr of 0.86 mg/dL). Liver Function Tests: Recent Labs  Lab 08/12/18 1517 08/13/18 0615  AST 41 30  ALT 19 17  ALKPHOS 122 91  BILITOT 0.7 0.9  PROT 6.1* 5.3*  ALBUMIN 2.1* 1.8*   No results for input(s): LIPASE, AMYLASE in the last 168 hours. No results for input(s): AMMONIA in the last 168 hours. Coagulation Profile: Recent Labs  Lab 08/12/18 1919  INR 1.10   Cardiac Enzymes: Recent Labs  Lab 08/12/18 1919 08/12/18 2358 08/13/18 0615  TROPONINI 0.13*  0.14* 0.11*   BNP (last 3 results) No results for input(s): PROBNP in the last 8760 hours. HbA1C: No results for input(s): HGBA1C in the last 72 hours. CBG: No results for input(s): GLUCAP in the last 168 hours. Lipid Profile: No results for input(s): CHOL, HDL, LDLCALC, TRIG, CHOLHDL, LDLDIRECT in the last 72 hours. Thyroid Function Tests: No results for input(s): TSH, T4TOTAL, FREET4, T3FREE, THYROIDAB in the last 72 hours. Anemia Panel: No results for input(s): VITAMINB12, FOLATE, FERRITIN, TIBC, IRON, RETICCTPCT in the last 72 hours. Urine analysis:    Component Value Date/Time   COLORURINE STRAW (A) 08/12/2018 1518   APPEARANCEUR CLEAR 08/12/2018 1518   LABSPEC 1.006 08/12/2018 1518   PHURINE 5.0 08/12/2018 1518   GLUCOSEU NEGATIVE 08/12/2018 1518   HGBUR NEGATIVE 08/12/2018 1518   BILIRUBINUR NEGATIVE 08/12/2018 1518   KETONESUR NEGATIVE 08/12/2018 1518   PROTEINUR NEGATIVE 08/12/2018 1518   NITRITE NEGATIVE 08/12/2018 1518   LEUKOCYTESUR NEGATIVE 08/12/2018 1518   Recent Results (from the past 240 hour(s))  Blood Culture (routine x 2)     Status: None   Collection Time: 08/12/18  3:10 PM  Result Value Ref Range Status   Specimen Description BLOOD BLOOD RIGHT FOREARM  Final   Special Requests   Final    BOTTLES DRAWN AEROBIC AND ANAEROBIC Blood Culture adequate volume   Culture   Final    NO GROWTH 5 DAYS Performed at [facility], [place] [address], [place], [place] [address]    Report Status 08/17/2018 FINAL  Final  Blood Culture (routine x 2)     Status: None   Collection Time: 08/12/18  3:55 PM  Result Value Ref Range Status   Specimen Description BLOOD BLOOD RIGHT FOREARM  Final   Special Requests   Final    BOTTLES DRAWN AEROBIC AND ANAEROBIC Blood Culture adequate volume   Culture   Final    NO GROWTH 5 DAYS Performed at [facility], [place] [address] [place], [place] [address]    Report Status 08/17/2018 FINAL  Final  MRSA PCR Screening      Status: None   Collection Time: 08/12/18  6:59 PM  Result Value Ref Range Status   MRSA by PCR NEGATIVE NEGATIVE Final    Comment:  The GeneXpert MRSA Assay (FDA approved for NASAL specimens only), is one component of a comprehensive MRSA colonization surveillance program. It is not intended to diagnose MRSA infection nor to guide or monitor treatment for MRSA infections. Performed at [facility], [place] [address] [place], [place] [address]       Radiology Studies: Dg Chest Port 1 View  Result Date: 08/17/2018 CLINICAL DATA:  Acute respiratory failure EXAM: PORTABLE CHEST 1 VIEW COMPARISON:  08/12/2018 FINDINGS: Mild patchy right lower lobe opacity. Left lung is clear. Low lung volumes. No pleural effusion or pneumothorax. Mild cardiomegaly. IMPRESSION: Mild patchy right lower lobe opacity, suspicious for pneumonia. Electronically Signed   By: Julian Hy M.D.   On: 08/17/2018 15:25    Scheduled Meds: . aspirin  81 mg Oral Daily  . atorvastatin  10 mg Oral Daily  . doxycycline  100 mg Oral Q12H  . enoxaparin (LOVENOX) injection  40 mg Subcutaneous Q24H  . feeding supplement (ENSURE ENLIVE)  237 mL Oral BID BM  . feeding supplement (PRO-STAT SUGAR FREE 64)  30 mL Oral TID WC  . ferrous sulfate  325 mg Oral TID WC  . folic acid  1 mg Oral Daily  . furosemide  60 mg Intravenous BID  . magnesium oxide  400 mg Oral Daily  . multivitamin with minerals  1 tablet Oral Q breakfast  . potassium chloride  40 mEq Oral TID  . sodium chloride flush  3 mL Intravenous Q12H  . thiamine  100 mg Oral Daily   Continuous Infusions: . cefTRIAXone (ROCEPHIN)  IV 1 g (08/17/18 2010)     LOS: 6 days   Time spent: 25 minutes.  Patrecia Pour, MD [facility] [email or web address] Password [email or web address] 08/18/2018, 1:54 PM

## 2018-08-18 NOTE — Care Management Note (Signed)
Case Management Note Marvetta Gibbons RN, BSN Unit 4E- RN Care Coordinator  [phone]  Patient Details  Name: Jesus Daniel MRN: [id number] Date of Birth: [birth year]  Subjective/Objective:   Pt admitted with acute CHF                Action/Plan: PTA pt lived at home alone, was recently at [facility] following his last hospital stay. Per PT/OT eval recommendations for return to SNF. CSW has seen pt in consult- however pt is hopeful to return home and does not want to pursue rehab again. CM also spoke with pt at bedside he is worried about copay cost to return to SNF. Call place to son -Gus who thought staff here was setting up 24/7 assistance for home. This Probation officer clarified with son that staff here does not assist with private duty assistance for home, only [facility] needs or return to rehab. Son reports that they do want pt to return home however need direction in setting up 24/7 assistance. Info provided to son on looking at [facility].gov site for local agencies and also goggle search for private duty. Son states that he will look into this and try to have something set up for transition home. Confirmed with son that pt had [place] services with [facility] in the past and would like to  Use them again for [facility] needs if unable then open to any insurance plan network agency.  Call has been placed to [place] with [facility] for [facility] needs- awaiting confirmation of referral acceptance. -- received call back from [place] with [facility]- they are unable to accept referral at this time (they had received referral from [facility] also on release from SNF and where not able to accept at that time either due to staffing). Spoke with Orrin Brigham with [facility] who pt is active with, discussed transition plan and needs, will reach out to [facility] for [facility] needs- call made to [facility] with [facility] for [facility] referral- referral has been accepted.   Update: 1400- received call from [facility]- per conversation pt was active with [facility] P[facility]- call made to Jonni Sanger  pt's other son who arranged this services per [facility]. Per TC with Jonni Sanger- confirmed that pt was receiving services with [facility] for 4hr/day. Discussed with Jonni Sanger transition of care plan for 24/7 care- per Jonni Sanger he was under the impression that Palliative care services were being arranged to provide this 24/7 care. Explained to Jonni Sanger that palliative care does not arrange 24/7 private duty care however they could follow in the home if desired. Explained that if he and his brother wanted to return pt home that they could increase hrs of care with [facility] for private duty or look at return to rehab.  Per Jonni Sanger he does not think pt is in copay days for SNF- and he request face13fce meeting in the AM to further discuss transition plan/needs. Will have CSW re-check copay days/cost. Per AJonni Sanger"my dad needs to be able to take care of himself before he can leave the hospital" - this writer explained to son that is why we needed to figure out a plan for transition for pt.  Expected Discharge Date:  08/18/18               Expected Discharge Plan:  [place] In-House Referral:  Clinical Social Work  Discharge planning Services  CM Consult  Post Acute Care Choice:  Home Health Choice offered to:  Adult Children  DME Arranged:  DME Agency:     HH Arranged:  RN, PT, OT, Nurse's Aide, Social Work [employer] Agency:  [facility] [place]  Status of Service:  In process, will continue to follow  If discussed at Long Length of Stay Meetings, dates discussed:    Discharge Disposition: skilled facility   Additional Comments:  08/18/18- 0900- Marvetta Gibbons RN, CM- met with pt and son Jonni Sanger at the bedside along with CSW- Caryl Pina- discussed transition of care plan home with [facility] and private duty vs [facility], son gave pt option with financial cost of going home with [facility] providing private duty care with [facility] for therapy vs going to SNF under copay day cost. Pt is now agreeable to going to STSNF  prior to returning home based on recommendations here and also son's encouragement to go to SNF prior to returning home with [facility] and private duty- pt does not want to return to [facility] and instead wants to look at [facility] to reach out to [facility] and do bed search for back up options. CSW to follow up with pt and sons for SNF options. Once SNF bed known, CM will reach out to [facility] to update on transition plan to that they can f/u post SNF for [facility] needs.   Dawayne Patricia, RN 08/18/2018, 11:40 AM

## 2018-08-18 NOTE — Progress Notes (Signed)
Clinical Social Work and RNCM met patient and son Jonni Sanger at bedside to discuss discharge options. Patient and son discuss what would be the safest discharge option for patient. Patient agreed with son and stated he will try rehab but stated he did not want to go back to [facility]. Jonni Sanger stated he would like patient to go to [facility] since facility is closer to patients support system.   CSW reached out to [facility] and they stated they will be unable to manage patients care at facility. Jonni Sanger made aware via phone and CSW was given verbal permission to fax patient out to other [place] facilities. CSW will follow up with family once bed offers are available.   Rhea Pink, MSW,  [place]

## 2018-08-19 DIAGNOSIS — R627 Adult failure to thrive: Secondary | ICD-10-CM | POA: Diagnosis not present

## 2018-08-19 DIAGNOSIS — Z96649 Presence of unspecified artificial hip joint: Secondary | ICD-10-CM | POA: Diagnosis not present

## 2018-08-19 DIAGNOSIS — K219 Gastro-esophageal reflux disease without esophagitis: Secondary | ICD-10-CM | POA: Diagnosis not present

## 2018-08-19 DIAGNOSIS — E876 Hypokalemia: Secondary | ICD-10-CM

## 2018-08-19 DIAGNOSIS — I959 Hypotension, unspecified: Secondary | ICD-10-CM | POA: Diagnosis not present

## 2018-08-19 DIAGNOSIS — I5031 Acute diastolic (congestive) heart failure: Secondary | ICD-10-CM | POA: Diagnosis not present

## 2018-08-19 DIAGNOSIS — R296 Repeated falls: Secondary | ICD-10-CM | POA: Diagnosis not present

## 2018-08-19 DIAGNOSIS — Z743 Need for continuous supervision: Secondary | ICD-10-CM | POA: Diagnosis not present

## 2018-08-19 DIAGNOSIS — E43 Unspecified severe protein-calorie malnutrition: Secondary | ICD-10-CM | POA: Diagnosis not present

## 2018-08-19 DIAGNOSIS — E46 Unspecified protein-calorie malnutrition: Secondary | ICD-10-CM

## 2018-08-19 DIAGNOSIS — J9601 Acute respiratory failure with hypoxia: Secondary | ICD-10-CM | POA: Diagnosis not present

## 2018-08-19 DIAGNOSIS — F1010 Alcohol abuse, uncomplicated: Secondary | ICD-10-CM | POA: Diagnosis not present

## 2018-08-19 DIAGNOSIS — Z96641 Presence of right artificial hip joint: Secondary | ICD-10-CM | POA: Diagnosis not present

## 2018-08-19 DIAGNOSIS — A4901 Methicillin susceptible Staphylococcus aureus infection, unspecified site: Secondary | ICD-10-CM | POA: Diagnosis not present

## 2018-08-19 DIAGNOSIS — I5033 Acute on chronic diastolic (congestive) heart failure: Secondary | ICD-10-CM | POA: Diagnosis not present

## 2018-08-19 DIAGNOSIS — T8459XD Infection and inflammatory reaction due to other internal joint prosthesis, subsequent encounter: Secondary | ICD-10-CM | POA: Diagnosis not present

## 2018-08-19 DIAGNOSIS — T8451XD Infection and inflammatory reaction due to internal right hip prosthesis, subsequent encounter: Secondary | ICD-10-CM | POA: Diagnosis not present

## 2018-08-19 DIAGNOSIS — R41841 Cognitive communication deficit: Secondary | ICD-10-CM | POA: Diagnosis not present

## 2018-08-19 DIAGNOSIS — M6281 Muscle weakness (generalized): Secondary | ICD-10-CM | POA: Diagnosis not present

## 2018-08-19 DIAGNOSIS — T8459XS Infection and inflammatory reaction due to other internal joint prosthesis, sequela: Secondary | ICD-10-CM | POA: Diagnosis not present

## 2018-08-19 DIAGNOSIS — E8770 Fluid overload, unspecified: Secondary | ICD-10-CM | POA: Diagnosis not present

## 2018-08-19 DIAGNOSIS — R2689 Other abnormalities of gait and mobility: Secondary | ICD-10-CM | POA: Diagnosis not present

## 2018-08-19 DIAGNOSIS — R279 Unspecified lack of coordination: Secondary | ICD-10-CM | POA: Diagnosis not present

## 2018-08-19 LAB — BASIC METABOLIC PANEL
Anion gap: 12 (ref 5–15)
BUN: 20 mg/dL (ref 8–23)
CALCIUM: 8.3 mg/dL — AB (ref 8.9–10.3)
CO2: 30 mmol/L (ref 22–32)
Chloride: 92 mmol/L — ABNORMAL LOW (ref 98–111)
Creatinine, Ser: 0.83 mg/dL (ref 0.61–1.24)
GFR calc Af Amer: >60 mL/min (ref >60)
GFR calc non Af Amer: >60 mL/min (ref >60)
Glucose, Bld: 94 mg/dL (ref 70–99)
Potassium: 3.6 mmol/L (ref 3.5–5.1)
Sodium: 134 mmol/L — ABNORMAL LOW (ref 135–145)

## 2018-08-19 MED ORDER — OXYCODONE HCL 5 MG PO TABS: 2.5000 mg | ORAL_TABLET | Freq: Two times a day (BID) | ORAL | 0 refills | Status: DC | PRN

## 2018-08-19 MED ORDER — FERROUS SULFATE 325 (65 FE) MG PO TABS: 325.0000 mg | ORAL_TABLET | Freq: Three times a day (TID) | ORAL | 3 refills | Status: DC

## 2018-08-19 MED ORDER — GUAIFENESIN-DM 100-10 MG/5ML PO SYRP: 5.0000 mL | ORAL_SOLUTION | ORAL | 0 refills | Status: DC | PRN

## 2018-08-19 MED ORDER — ADULT MULTIVITAMIN W/MINERALS CH: 1.0000 | ORAL_TABLET | Freq: Every day | ORAL | Status: AC

## 2018-08-19 MED ORDER — AMOXICILLIN-POT CLAVULANATE 875-125 MG PO TABS: 1.0000 | ORAL_TABLET | Freq: Two times a day (BID) | ORAL | 0 refills | Status: AC

## 2018-08-19 MED ORDER — ASPIRIN 81 MG PO CHEW: 81.0000 mg | CHEWABLE_TABLET | Freq: Every day | ORAL | 0 refills | Status: AC

## 2018-08-19 MED ORDER — PRO-STAT SUGAR FREE PO LIQD: 30.0000 mL | Freq: Three times a day (TID) | ORAL | 0 refills | Status: DC

## 2018-08-19 MED ORDER — POLYETHYLENE GLYCOL 3350 17 G PO PACK: 17.0000 g | PACK | Freq: Every day | ORAL | 0 refills | Status: AC | PRN

## 2018-08-19 NOTE — Clinical Social Work Note (Addendum)
Insurance authorization denied. MD agreeable to peer-to-peer review. CSW updated patient's son Jesus Daniel.  Dayton Scrape, CSW [phone]  3:39 pm Authorization approved: [id number] for 7 days.  Dayton Scrape, [place]

## 2018-08-19 NOTE — Progress Notes (Signed)
Report called to [facility]. All questions answered.

## 2018-08-19 NOTE — Progress Notes (Signed)
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 Pt with acute on chronic heart failure.   OT comments  Pt adamant he was not getting OOB. Agreed to sit EOB, tolerated x 5 minutes. Repositioned in bed and set up for lunch with HOB up. Will continue to follow.  Follow Up Recommendations  SNF;Supervision/Assistance - 24 hour    Equipment Recommendations       Recommendations for Other Services      Precautions / Restrictions Precautions Precautions: Fall Restrictions Weight Bearing Restrictions: No       Mobility Bed Mobility Overal bed mobility: Needs Assistance Bed Mobility: Supine to Sit;Sit to Supine     Supine to sit: Max assist Sit to supine: Max assist   General bed mobility comments: assist for all apects, cues to use rail to self assist  Transfers                 General transfer comment: pt adamantly declined    Balance Overall balance assessment: Needs assistance   Sitting balance-Leahy Scale: Fair Sitting balance - Comments: x 5 minutes                                   ADL either performed or assessed with clinical judgement   ADL                                               Vision       Perception     Praxis      Cognition Arousal/Alertness: Lethargic Behavior  During Therapy: Flat affect Overall Cognitive Status: Impaired/Different from baseline Area of Impairment: Memory;Problem solving;Attention;Orientation                 Orientation Level: Time Current Attention Level: Sustained Memory: Decreased short-term memory       Problem Solving: Slow processing;Decreased initiation;Difficulty sequencing;Requires verbal cues;Requires tactile cues General Comments: poor insight into importance of activity for improvement he seeks        Exercises     Shoulder Instructions       General Comments      Pertinent Vitals/ Pain       Pain Assessment: Faces Faces Pain Scale: Hurts even more Pain Location: generalized Pain Descriptors / Indicators: Sore Pain Intervention(s): Monitored during session;Repositioned  Home Living                                          Prior Functioning/Environment              Frequency  Min 2X/week        Progress Toward Goals  OT  Goals(current goals can now be found in the care plan section)  Progress towards OT goals: Not progressing toward goals - comment(declining OOB)  Acute Rehab OT Goals Patient Stated Goal: to go home OT Goal Formulation: With patient Time For Goal Achievement: 08/27/18 Potential to Achieve Goals: [place] Discharge plan remains appropriate    Co-evaluation                 AM-PAC PT "6 Clicks" Daily Activity     Outcome Measure   Help from another person eating meals?: None Help from another person taking care of personal grooming?: A Little Help from another person toileting, which includes using toliet, bedpan, or urinal?: Total Help from another person bathing (including washing, rinsing, drying)?: A Lot Help from another person to put on and taking off regular upper body clothing?: A Little Help from another person to put on and taking off regular lower body clothing?: Total 6 Click Score: 14    End of Session    OT  Visit Diagnosis: Unsteadiness on feet (R26.81);Other abnormalities of gait and mobility (R26.89);Muscle weakness (generalized) (M62.81);Pain   Activity Tolerance Patient limited by lethargy   Patient Left in bed;with call bell/phone within reach;with bed alarm set   Nurse Communication          Time: 9449-6759 OT Time Calculation (min): 16 min  Charges: OT General Charges $OT Visit: 1 Visit OT Treatments $Therapeutic Activity: 8-22 mins  Nestor Lewandowsky, OTR/L Acute Rehabilitation Services Pager: [phone] Office: [phone] Malka So 08/19/2018, 11:32 AM

## 2018-08-19 NOTE — Clinical Social Work Placement (Signed)
   CLINICAL SOCIAL WORK PLACEMENT  NOTE  Date:  08/19/2018  Patient Details  Name: Jesus Daniel MRN: [id number] Date of Birth: 16-Dec-1945  Clinical Social Work is seeking post-discharge placement for this patient at the [place] level of care (*CSW will initial, date and re-position this form in  chart as items are completed):      Patient/family provided with [place] Work Department's list of facilities offering this level of care within the geographic area requested by the patient (or if unable, by the patient's family).      Patient/family informed of their freedom to choose among providers that offer the needed level of care, that participate in Medicare, Medicaid or managed care program needed by the patient, have an available bed and are willing to accept the patient.      Patient/family informed of [place]'s ownership interest in [facility] and [facility], as well as of the fact that they are under no obligation to receive care at these facilities.  PASRR submitted to EDS on 08/18/18     PASRR number received on       Existing PASRR number confirmed on 08/18/18     FL2 transmitted to all facilities in geographic area requested by pt/family on 08/18/18     FL2 transmitted to all facilities within larger geographic area on       Patient informed that his/her managed care company has contracts with or will negotiate with certain facilities, including the following:        Yes   Patient/family informed of bed offers received.  Patient [facility] bed at [facility]     Physician recommends and patient chooses bed at      Patient to be transferred to [facility] on 08/19/18.  Patient to be transferred to facility by [facility]     Patient family notified on 08/19/18 of transfer.  Name of family member notified:  Aundria Rud     PHYSICIAN Please prepare prescriptions     Additional Comment:     _______________________________________________ Candie Chroman, LCSW 08/19/2018, 4:25 PM

## 2018-08-19 NOTE — Clinical Social Work Note (Signed)
CSW facilitated patient discharge including contacting patient family and facility to confirm patient discharge plans. Clinical information faxed to facility and family agreeable with plan. CSW arranged ambulance transport via [facility] to [facility] at 5:00 pm. RN to call report prior to discharge [phone] Room 104P).  CSW will sign off for now as social work intervention is no longer needed. Please consult [place] again if new needs arise.  Dayton Scrape, [place]

## 2018-08-19 NOTE — Discharge Summary (Addendum)
Physician Discharge Summary  Jesus Daniel WEX:[id number] DOB: January 19, 1946 DOA: 08/12/2018  PCP: Bernerd Limbo, MD  Admit date: 08/12/2018 Discharge date: 08/19/2018  Admitted From: Home Disposition:  SNF when bed available.   Recommendations for Outpatient Follow-up:  1. Follow up with PCP in 1-2 weeks 2. Please obtain BMP/CBC in one week 3. Continue with Doxycycline as per Dr Johnnye Sima.    Discharge Condition:STABLE.  CODE STATUS:FULL CODE.  Diet recommendation: Heart Healthy   Brief/Interim Summary: Jesus Daniel is a 72 y.o. male with a history of alcohol abuse, chronic inoperable right hip infection, GERD, HTN, gout, and depression with anxiety who presented to the ED from home due to shortness of breath, leg swelling, and gurgling breath sounds. He was hospitalized 8/5-8/8 for frequent falls, skin tears, electrolyte disturbances and AKI, discharged to SNF and progressed some with physical therapy, discharged home on 8/31. Since that time he had been not eating much and very sedentary but not drinking alcohol. Unclear if he was taking lasix. He began having swelling and dyspnea, so presented to the ED, found to be volume overloaded, started on IV lasix. Symptoms have improved and SNF (vs. 24 hour home supervision) was again recommended. The patient and son, his primary caretaker, fear that the cycle will recur with failure to thrive after discharge but initially opted for discharge to home, though they have reconsidered and are asking to pursue SNF placement. Diuresis has continued with significant improvement in edema. Hypoxia continued, so CXR was performed suggestive of RLL infiltrate, so ceftriaxone was started, transitioned to oral antibiotics to complete the course.   Discharge Diagnoses:  Active Problems:   HTN (hypertension)   GERD (gastroesophageal reflux disease)   Prosthetic hip infection (HCC)   HLD (hyperlipidemia)   Hypokalemia   Multiple skin tears   Acute CHF (congestive  heart failure) (HCC)   Anasarca   Hypoalbuminemia due to protein-calorie malnutrition (HCC)   Adult failure to thrive   Protein-calorie malnutrition, severe   Goals of care, counseling/discussion   Palliative care by specialist  Acute on chronic HFpEF: EF 55-60%, mild-moderate AS.  Diuresed appropriately, resume home lasix on discharge.  - Heart healthy diet - Cardiology follow up as outpatient  Acute hypoxic respiratory failure: Due to pulmonary edema/volume overload which has improved and possibly RLL pneumonia noted on CXR 9/9.  Continue lasix and oral antibiotics to complete the course on discharge.   Troponin elevation: Mild due to demand from CHF, no chest pain, ECG without ischemic changes.  - ASA, statin. BP too soft for BB.   Dyslipidemia:  - Statin  EtOH abuse: Has not had a drink since just prior to previous hospitalization (>1 month ago). No evidence of withdrawal. - Folate, thiamine,MVM's  Chronicprostheticright hip infection: Not a surgical candidate. This is impacting his functional mobility and quality of life significantly.  - Continue po doxycycline - Continue judicious pain control (sees Dr. Hardin Negus, pain management). Please note the patient's son brought his oxycodone from home for patient to take supplementally to those provided here. These were confiscated. - Follow up with Dr. Johnnye Sima as outpatient  Skin tears: Continued problem, though no new wounds. Monitor.  Hypokalemia: Worsened by loop diuretic but improved with repletion. - Continue replacement,increase potassium supplements BID.   Frequent falls, sedentary:   - PT/OT consulted, recommending 24 hour supervision or SNF.   Skin avulsions: Improving.  - WOC consulted and recommendations given.   Moderate protein calorie malnutrition:  - Protein supplementation  Macrocytic anemia:  Stable in 7's since last admission. TSH wnl.  - Monitor, intermittently - Supplementing  folate  HTN:  Well controlled.   GERD:  - PPI as above  Hyperlipidemia:  - Continue statin   Discharge Instructions  Discharge Instructions    (HEART FAILURE PATIENTS) Call MD:  Anytime you have any of the following symptoms: 1) 3 pound weight gain in 24 hours or 5 pounds in 1 week 2) shortness of breath, with or without a dry hacking cough 3) swelling in the hands, feet or stomach 4) if you have to sleep on extra pillows at night in order to breathe.   Complete by:  As directed    Diet - low sodium heart healthy   Complete by:  As directed    Discharge instructions   Complete by:  As directed    Follow up with PCP in one week.     Allergies as of 08/19/2018   No Known Allergies     Medication List    TAKE these medications   acetaminophen 500 MG tablet Commonly known as:  TYLENOL Take 500 mg by mouth every 6 (six) hours as needed for mild pain.   albuterol (2.5 MG/3ML) 0.083% nebulizer solution Commonly known as:  PROVENTIL Take 2.5 mg by nebulization every 4 (four) hours as needed for wheezing or shortness of breath.   aspirin 81 MG chewable tablet Chew 1 tablet (81 mg total) by mouth daily. Start taking on:  08/20/2018   atorvastatin 10 MG tablet Commonly known as:  LIPITOR Take 10 mg by mouth daily.   doxycycline 100 MG tablet Commonly known as:  VIBRA-TABS TAKE 1 TABLET (100 MG TOTAL) BY MOUTH 2 (TWO) TIMES DAILY. What changed:    how much to take  how to take this  when to take this  additional instructions   feeding supplement (ENSURE ENLIVE) Liqd Take 237 mLs by mouth 2 (two) times daily between meals.   feeding supplement (PRO-STAT SUGAR FREE 64) Liqd Take 30 mLs by mouth 3 (three) times daily with meals.   ferrous sulfate 325 (65 FE) MG tablet Take 1 tablet (325 mg total) by mouth 3 (three) times daily with meals.   folic acid 1 MG tablet Commonly known as:  FOLVITE Take 1 tablet (1 mg total) by mouth daily.   furosemide 40 MG  tablet Commonly known as:  LASIX Take 40 mg by mouth 2 (two) times daily as needed for fluid.   guaiFENesin-dextromethorphan 100-10 MG/5ML syrup Commonly known as:  ROBITUSSIN DM Take 5 mLs by mouth every 4 (four) hours as needed for cough.   hydrOXYzine 10 MG tablet Commonly known as:  ATARAX/VISTARIL Take 1 tablet (10 mg total) by mouth every 6 (six) hours as needed for itching.   magnesium oxide 400 MG tablet Commonly known as:  MAG-OX Take 1 tablet (400 mg total) by mouth daily.   methocarbamol 500 MG tablet Commonly known as:  ROBAXIN Take 500 mg by mouth every 8 (eight) hours as needed for muscle spasms.   multivitamin with minerals Tabs tablet Take 1 tablet by mouth daily with breakfast. Start taking on:  08/20/2018   oxyCODONE 5 MG immediate release tablet Commonly known as:  Oxy IR/ROXICODONE Take 0.5 tablets (2.5 mg total) by mouth 2 (two) times daily as needed for moderate pain. *Hold for sedation*   polyethylene glycol packet Commonly known as:  MIRALAX / GLYCOLAX Take 17 g by mouth daily as needed for mild constipation.   potassium  chloride 10 MEQ tablet Commonly known as:  K-DUR Take 1 tablet (10 mEq total) by mouth daily.   thiamine 100 MG tablet Commonly known as:  VITAMIN B-1 Take 1 tablet (100 mg total) by mouth daily.      Follow-up Information    [facility], [facility] Follow up.   Specialty:  [place] Why:  HHRN/PT/OT/aide/sw arranged- they will call you to set up start date and home visits Contact information: [address] [place] [address] [phone]        Bernerd Limbo, MD. Schedule an appointment as soon as possible for a visit in 1 week(s).   Specialty:  Family Medicine Contact information: [place] [address] [phone]          No Known Allergies  Consultations:  Physical therapy   Procedures/Studies: Dg Chest 1 View  Result Date: 08/12/2018 CLINICAL DATA:   Shortness of breath EXAM: CHEST  1 VIEW COMPARISON:  Chest radiograph 07/04/2015 FINDINGS: There is shallow lung inflation. There is mild cardiomegaly. There is no focal airspace consolidation or pulmonary edema. There is no pleural effusion or pneumothorax. IMPRESSION: Shallow lung inflation and mild cardiomegaly. Electronically Signed   By: Ulyses Jarred M.D.   On: 08/12/2018 15:13   Dg Chest Port 1 View  Result Date: 08/17/2018 CLINICAL DATA:  Acute respiratory failure EXAM: PORTABLE CHEST 1 VIEW COMPARISON:  08/12/2018 FINDINGS: Mild patchy right lower lobe opacity. Left lung is clear. Low lung volumes. No pleural effusion or pneumothorax. Mild cardiomegaly. IMPRESSION: Mild patchy right lower lobe opacity, suspicious for pneumonia. Electronically Signed   By: Julian Hy M.D.   On: 08/17/2018 15:25       Subjective: No chest pain or sob.   Discharge Exam: Vitals:   08/19/18 0648 08/19/18 1117  BP:  135/77  Pulse: 91 97  Resp: (!) 32 (!) 30  Temp:  98.7 F (37.1 C)  SpO2: 91% 93%   Vitals:   08/18/18 2000 08/19/18 0351 08/19/18 0648 08/19/18 1117  BP: 119/72 105/81  135/77  Pulse:   91 97  Resp:  (!) 29 (!) 32 (!) 30  Temp:  98.9 F (37.2 C)  98.7 F (37.1 C)  TempSrc:  Oral  Axillary  SpO2:  93% 91% 93%  Weight:   90.3 kg   Height:        General: Pt is alert, awake, not in acute distress Cardiovascular: RRR, S1/S2 +, no rubs, no gallops Respiratory: air entry fair, no wheezing or rhonchi.  Abdominal: Soft, NT, ND, bowel sounds + Extremities: lower legs with ACE wraps, no erythema.     The results of significant diagnostics from this hospitalization (including imaging, microbiology, ancillary and laboratory) are listed below for reference.     Microbiology: Recent Results (from the past 240 hour(s))  Blood Culture (routine x 2)     Status: None   Collection Time: 08/12/18  3:10 PM  Result Value Ref Range Status   Specimen Description BLOOD BLOOD RIGHT  FOREARM  Final   Special Requests   Final    BOTTLES DRAWN AEROBIC AND ANAEROBIC Blood Culture adequate volume   Culture   Final    NO GROWTH 5 DAYS Performed at [facility], [address] [place], [place] [address]    Report Status 08/17/2018 FINAL  Final  Blood Culture (routine x 2)     Status: None   Collection Time: 08/12/18  3:55 PM  Result Value Ref Range Status   Specimen Description BLOOD BLOOD RIGHT FOREARM  Final   Special Requests   Final    BOTTLES DRAWN AEROBIC AND ANAEROBIC Blood Culture adequate volume   Culture   Final    NO GROWTH 5 DAYS Performed at [facility], [address], [place], [place] [address]    Report Status 08/17/2018 FINAL  Final  MRSA PCR Screening     Status: None   Collection Time: 08/12/18  6:59 PM  Result Value Ref Range Status   MRSA by PCR NEGATIVE NEGATIVE Final    Comment:        The GeneXpert MRSA Assay (FDA approved for NASAL specimens only), is one component of a comprehensive MRSA colonization surveillance program. It is not intended to diagnose MRSA infection nor to guide or monitor treatment for MRSA infections. Performed at [facility], [place] [address] [place], [place] [address]      Labs: BNP (last 3 results) Recent Labs    08/12/18 1518  BNP 132.4*   Basic Metabolic Panel: Recent Labs  Lab 08/12/18 1919  08/13/18 1023 08/14/18 0353 08/15/18 0230 08/16/18 0436 08/17/18 0414 08/18/18 0420 08/19/18 0617  NA  --    < >  --  137 136 137 138 135 134*  K  --    < >  --  3.3* 3.6 4.0 3.2* 3.3* 3.6  CL  --    < >  --  95* 93* 93* 94* 92* 92*  CO2  --    < >  --  31 34* 33* 34* 34* 30  GLUCOSE  --    < >  --  86 91 85 94 105* 94  BUN  --    < >  --  5* 10 16 21 22 20   CREATININE  --    < >  --  0.82 0.92 0.91 0.94 0.86 0.83  CALCIUM  --    < >  --  7.9* 7.9* 8.2* 8.1* 8.2* 8.3*  MG 1.2*  --  1.5* 1.7  --   --   --  1.5*  --    < > = values in this interval not displayed.   Liver Function  Tests: Recent Labs  Lab 08/12/18 1517 08/13/18 0615  AST 41 30  ALT 19 17  ALKPHOS 122 91  BILITOT 0.7 0.9  PROT 6.1* 5.3*  ALBUMIN 2.1* 1.8*   No results for input(s): LIPASE, AMYLASE in the last 168 hours. No results for input(s): AMMONIA in the last 168 hours. CBC: Recent Labs  Lab 08/12/18 1517 08/13/18 0615 08/14/18 0353 08/15/18 0230 08/17/18 0414  WBC 7.2 6.8 5.0 5.5 6.1  NEUTROABS 5.2  --   --   --   --   HGB 9.0* 7.8* 7.4* 7.3* 7.9*  HCT 29.7* 24.9* 23.4* 23.5* 25.9*  MCV 103.5* 100.8* 98.7 100.0 101.2*  PLT 231 220 218 221 240   Cardiac Enzymes: Recent Labs  Lab 08/12/18 1919 08/12/18 2358 08/13/18 0615  TROPONINI 0.13* 0.14* 0.11*   BNP: Invalid input(s): POCBNP CBG: No results for input(s): GLUCAP in the last 168 hours. D-Dimer No results for input(s): DDIMER in the last 72 hours. Hgb A1c No results for input(s): HGBA1C in the last 72 hours. Lipid Profile No results for input(s): CHOL, HDL, LDLCALC, TRIG, CHOLHDL, LDLDIRECT in the last 72 hours. Thyroid function studies No results for input(s): TSH, T4TOTAL, T3FREE, THYROIDAB in the last  72 hours.  Invalid input(s): FREET3 Anemia work up No results for input(s): VITAMINB12, FOLATE, FERRITIN, TIBC, IRON, RETICCTPCT in the last 72 hours. Urinalysis    Component Value Date/Time   COLORURINE STRAW (A) 08/12/2018 1518   APPEARANCEUR CLEAR 08/12/2018 1518   LABSPEC 1.006 08/12/2018 1518   PHURINE 5.0 08/12/2018 1518   GLUCOSEU NEGATIVE 08/12/2018 1518   HGBUR NEGATIVE 08/12/2018 1518   BILIRUBINUR NEGATIVE 08/12/2018 1518   KETONESUR NEGATIVE 08/12/2018 1518   PROTEINUR NEGATIVE 08/12/2018 1518   NITRITE NEGATIVE 08/12/2018 1518   LEUKOCYTESUR NEGATIVE 08/12/2018 1518   Sepsis Labs Invalid input(s): PROCALCITONIN,  WBC,  LACTICIDVEN Microbiology Recent Results (from the past 240 hour(s))  Blood Culture (routine x 2)     Status: None   Collection Time: 08/12/18  3:10 PM  Result Value Ref  Range Status   Specimen Description BLOOD BLOOD RIGHT FOREARM  Final   Special Requests   Final    BOTTLES DRAWN AEROBIC AND ANAEROBIC Blood Culture adequate volume   Culture   Final    NO GROWTH 5 DAYS Performed at [facility], [place] [address] [place], [place] [address]    Report Status 08/17/2018 FINAL  Final  Blood Culture (routine x 2)     Status: None   Collection Time: 08/12/18  3:55 PM  Result Value Ref Range Status   Specimen Description BLOOD BLOOD RIGHT FOREARM  Final   Special Requests   Final    BOTTLES DRAWN AEROBIC AND ANAEROBIC Blood Culture adequate volume   Culture   Final    NO GROWTH 5 DAYS Performed at [facility], [place] [address], [place], [place] [address]    Report Status 08/17/2018 FINAL  Final  MRSA PCR Screening     Status: None   Collection Time: 08/12/18  6:59 PM  Result Value Ref Range Status   MRSA by PCR NEGATIVE NEGATIVE Final    Comment:        The GeneXpert MRSA Assay (FDA approved for NASAL specimens only), is one component of a comprehensive MRSA colonization surveillance program. It is not intended to diagnose MRSA infection nor to guide or monitor treatment for MRSA infections. Performed at [facility], [place] [address], [place],  [address]      Time coordinating discharge: 35 minutes  SIGNED:   Hosie Poisson, MD  [facility] 08/19/2018, 12:30 PM Pager   If 7PM-7AM, please contact night-coverage [email or web address] Password [email or web address]

## 2018-08-20 ENCOUNTER — Other Ambulatory Visit: Payer: Self-pay

## 2018-08-20 ENCOUNTER — Other Ambulatory Visit: Payer: Self-pay | Admitting: *Deleted

## 2018-08-20 DIAGNOSIS — I5033 Acute on chronic diastolic (congestive) heart failure: Secondary | ICD-10-CM | POA: Diagnosis not present

## 2018-08-20 DIAGNOSIS — J9601 Acute respiratory failure with hypoxia: Secondary | ICD-10-CM | POA: Diagnosis not present

## 2018-08-20 DIAGNOSIS — E43 Unspecified severe protein-calorie malnutrition: Secondary | ICD-10-CM | POA: Diagnosis not present

## 2018-08-20 DIAGNOSIS — I509 Heart failure, unspecified: Secondary | ICD-10-CM

## 2018-08-20 DIAGNOSIS — R627 Adult failure to thrive: Secondary | ICD-10-CM | POA: Diagnosis not present

## 2018-08-20 NOTE — Patient Outreach (Signed)
[facility] Care Management follow up.  Chart reviewed. Noted Jesus Daniel discharged to [facility] on 08/19/18. Will update [facility] team and make referral to [facility] for follow up while at [facility].   Marthenia Rolling, MSN-Ed, RN,BSN [facility] Liaison [phone]

## 2018-08-20 NOTE — Patient Outreach (Signed)
[facility]) Care Management  08/20/2018  Jesus Daniel 06/21/1946 [id number]  BSW received notification the patient has discharged from the hospital and transferred to [facility]. BSW will send a referral to CSW, Nat Christen, who covers this SNF. BSW to sign off at this time.  BSW will also notify patients [facility] RNCM, [name] of patients disposition.  Daneen Schick, BSW, CDP [facility] [phone]

## 2018-08-24 ENCOUNTER — Ambulatory Visit: Payer: Self-pay | Admitting: *Deleted

## 2018-08-25 ENCOUNTER — Other Ambulatory Visit: Payer: Self-pay | Admitting: *Deleted

## 2018-08-25 ENCOUNTER — Encounter: Payer: Self-pay | Admitting: *Deleted

## 2018-08-25 NOTE — Patient Outreach (Signed)
[facility]) Care Management  08/25/2018  Jesus Daniel [birth year] [id number]   CSW was able to make initial contact with patient today to perform the assessment, as well as assess and assist with social work needs and services, when Jesus Daniel met with patient at [facility], [place] where patient currently resides to receive short-term rehabilitative services.  CSW introduced self, explained role and types of services provided through [facility] ([place] [facility]).  CSW further explained to patient that CSW works with patient's RNCM, also with [facility], [name]. CSW then explained the reason for the visit, indicating that Jesus Daniel thought that patient would benefit from social work services and resources to assist with discharge planning needs and services from the skilled nursing facility.  CSW obtained two HIPAA compliant identifiers from patient, which included patient's name and date of birth.  Patient had just finished working with therapies (both physical and occupational) at the time of CSW's arrival.  Patient admitted to being in pain, after having walked for several feet, but denied asking for pain medication.  Patient indicated that he will be returning home to live independently at time of discharge from [facility], but definitely wants assistance with arranging in-home care services.  CSW provided patient with a list of home health agencies for him to review with his sons's, Jesus Daniel and Jesus Daniel, who patient reports are extremely supportive of him and will definitely want to be involved in the decision making process.  Patient indicated that both his son's live nearby and try to check in with him on a daily basis.  CSW agreed to follow-up with patient again next week (Tuesday, September 01, 2018 at 11:00AM) to assess and assist with discharge planning needs and  services.  Jesus Daniel, BSW, MSW, LCSW  Licensed Education officer, [facility]  Mailing [place] [address], [place], [place] [address] Physical Address-[address], [place], [place] [address] Toll Free Main # [phone] Fax # [phone] Cell # [phone]  Office # [phone] [email or web address]

## 2018-09-01 ENCOUNTER — Other Ambulatory Visit: Payer: Self-pay | Admitting: *Deleted

## 2018-09-01 NOTE — Patient Outreach (Signed)
[facility]) Care Management  09/01/2018  Jesus Daniel 1945-12-14 [id number]   CSW was able to meet with patient at [facility] today, [place] where patient currently resides to receive short-term rehabilitative services, to perform a routine visit.  Patient had just finished working with therapies, both physical and occupational, and was trying to get back into bed at the time of CSW's arrival.  Patient admitted to being in a great deal of pain, for which his attending nurse provided pain medication. Patient indicated that he is somewhat disappointed that he has not been able to discharge back home yet, but realizes that he will have to be able to perform all activities of daily living independently, prior to returning home to live alone.  Patient is agreeable to home health services being arranged for him, as well as durable medical equipment ordered, if recommended.  Patient reported that he relies a lot on his two sons, Jesus Daniel and Jesus Daniel, whom live within minutes of patient's current place of residence. Patient stated that both his sons make sure that he has at least one hot meal per day to eat, that he takes his medications properly and that he gets to and from all his physician appointments.  CSW agreed to follow-up with patient again next week, on Friday, September 11, 2018 at 1:00PM, to assess and assist with discharge planning needs and services, as well as to check patient's progress. Nat Christen, BSW, MSW, LCSW  Licensed Education officer, environmental Health System  Mailing [place] [address], [place], [place] [address] Physical Address[address], [place], [place] [address] Toll Free Main # [phone] Fax # [phone] Cell # [phone]  Office # [phone] [email or web address]

## 2018-09-09 DIAGNOSIS — I959 Hypotension, unspecified: Secondary | ICD-10-CM | POA: Diagnosis not present

## 2018-09-09 DIAGNOSIS — E876 Hypokalemia: Secondary | ICD-10-CM | POA: Diagnosis not present

## 2018-09-11 ENCOUNTER — Other Ambulatory Visit: Payer: Self-pay | Admitting: *Deleted

## 2018-09-11 DIAGNOSIS — E876 Hypokalemia: Secondary | ICD-10-CM | POA: Diagnosis not present

## 2018-09-11 DIAGNOSIS — I959 Hypotension, unspecified: Secondary | ICD-10-CM | POA: Diagnosis not present

## 2018-09-11 DIAGNOSIS — T8451XD Infection and inflammatory reaction due to internal right hip prosthesis, subsequent encounter: Secondary | ICD-10-CM | POA: Diagnosis not present

## 2018-09-11 DIAGNOSIS — F1010 Alcohol abuse, uncomplicated: Secondary | ICD-10-CM | POA: Diagnosis not present

## 2018-09-11 NOTE — Patient Outreach (Signed)
[facility]) Care Management  09/11/2018  Jesus Daniel 1946-04-14 [id number]  CSW was able to meet with patient today at [facility], [place] where patient currently resides to receive short-term rehabilitative services.  Patient appeared to be in good spirits today, lying in bed watching television at the time of CSW's arrival.  Patient was happy to inform CSW that he may be discharged home tomorrow (Saturday, September 12, 2018), or at least that is the plan at the present time.  Patient indicated that he has already made arrangements for his son, Jesus Daniel to pick him up from [facility] and transport him back home.  Home health services have been arranged for patient through [facility].  These services include a home health nurse, as well as a physical therapist.  Patient reports that he already has all the durable medical equipment he needs in the home, which consists of a wheelchair ramp, raised toilet seat, wheelchair, walker, nebulizer machine, grab bars in shower and hand-held shower hose.  Patient was pleased to inform CSW that he is able to ambulate with the assistance of his walker and that he actually took a shower independently this morning.  Patient's son's have agreed to check in with patient on a daily basis to ensure that patient is eating and taking his medications properly.  Patient reported that his son's are able to transport him to and from all his physician appointments and that he is able to afford to get his prescription medications filled.  CSW agreed to follow-up with patient again on Wednesday, September 16, 2018 to ensure that patient had a smooth transition back home.  CSW also agreed to contact [name], [facility], also with [facility], to ensure that she is aware of patient's discharge plans.  Nat Christen, BSW, MSW, LCSW  Licensed [facility]  Mailing [place] [address], [place], [place] [address] Physical Address-[address], [place], [place] [address] Toll Free Main # [phone] Fax # [phone] Cell # [phone]  Office # [phone] [email or web address]

## 2018-09-14 ENCOUNTER — Encounter: Payer: Self-pay | Admitting: Family Medicine

## 2018-09-14 ENCOUNTER — Other Ambulatory Visit: Payer: Self-pay

## 2018-09-14 ENCOUNTER — Ambulatory Visit (INDEPENDENT_AMBULATORY_CARE_PROVIDER_SITE_OTHER): Payer: PPO | Admitting: Family Medicine

## 2018-09-14 VITALS — BP 110/70 | HR 120 | Ht 68.0 in

## 2018-09-14 DIAGNOSIS — R627 Adult failure to thrive: Secondary | ICD-10-CM

## 2018-09-14 DIAGNOSIS — E611 Iron deficiency: Secondary | ICD-10-CM

## 2018-09-14 DIAGNOSIS — I10 Essential (primary) hypertension: Secondary | ICD-10-CM

## 2018-09-14 DIAGNOSIS — Z96649 Presence of unspecified artificial hip joint: Secondary | ICD-10-CM

## 2018-09-14 DIAGNOSIS — M1611 Unilateral primary osteoarthritis, right hip: Secondary | ICD-10-CM

## 2018-09-14 DIAGNOSIS — R000 Tachycardia, unspecified: Secondary | ICD-10-CM | POA: Diagnosis not present

## 2018-09-14 DIAGNOSIS — F1010 Alcohol abuse, uncomplicated: Secondary | ICD-10-CM

## 2018-09-14 DIAGNOSIS — T8459XD Infection and inflammatory reaction due to other internal joint prosthesis, subsequent encounter: Secondary | ICD-10-CM | POA: Diagnosis not present

## 2018-09-14 DIAGNOSIS — D649 Anemia, unspecified: Secondary | ICD-10-CM | POA: Diagnosis not present

## 2018-09-14 MED ORDER — PRO-STAT SUGAR FREE PO LIQD: 30.0000 mL | Freq: Three times a day (TID) | ORAL | 0 refills | Status: DC

## 2018-09-14 NOTE — Progress Notes (Signed)
Subjective:  Patient ID: Jesus Daniel, male    DOB: 1946/03/21  Age: 72 y.o. MRN: [id number]  CC: Establish Care   HPI CRIT OBREMSKI presents for follow-up of hypertension, hypomagnesemia, anemia, iron deficiency status post discharge from skilled nursing facility 2 days ago.  History of alcohol abuse/alcoholism.  He did drink today.  He has been hospitalized on 1 September for congestive heart failure.  He was discharged on the fourth to skilled nursing facility where he has remained until 2 days ago.  He was prescribed ongoing therapy with doxycycline.  History of anemia that has been macrocytic.  B12 and folate levels were normal.  Iron levels were low.  It is not clear to me whether or not he is been taking his doxycycline or iron.  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  He uses his walker at home but otherwise is wheelchair-bound.  He is accompanied today by his son and sister-in-law.  Patient's son tells me that his father's heart rate increases when he sees a doctor.  Recent TSH level drawn in the hospital was normal.  Outpatient Medications Prior to Visit  Medication Sig Dispense Refill  . acetaminophen (TYLENOL) 500 MG tablet Take 500 mg by mouth every 6 (six) hours as needed for mild pain.     Marland Kitchen albuterol (PROVENTIL) (2.5 MG/3ML) 0.083% nebulizer solution Take 2.5 mg by nebulization every 4 (four) hours as needed for wheezing or shortness of breath.     Marland Kitchen aspirin 81 MG chewable tablet Chew 1 tablet (81 mg total) by mouth daily. 30 tablet 0  . atorvastatin (LIPITOR) 10 MG tablet Take 10 mg by mouth daily.  3  . ferrous sulfate 325 (65 FE) MG tablet Take 1 tablet (325 mg total) by mouth 3 (three) times daily with meals.  3  . folic acid (FOLVITE) 1 MG tablet Take 1 tablet (1 mg total) by mouth daily.  3  . furosemide (LASIX) 40 MG tablet Take 40 mg by mouth 2 (two) times daily as needed for fluid.     .  magnesium oxide (MAG-OX) 400 MG tablet Take 1 tablet (400 mg total) by mouth daily.    . Multiple Vitamin (MULTIVITAMIN WITH MINERALS) TABS tablet Take 1 tablet by mouth daily with breakfast.    . oxyCODONE (OXY IR/ROXICODONE) 5 MG immediate release tablet Take 0.5 tablets (2.5 mg total) by mouth 2 (two) times daily as needed for moderate pain. *Hold for sedation* 3 tablet 0  . polyethylene glycol (MIRALAX / GLYCOLAX) packet Take 17 g by mouth daily as needed for mild constipation. 14 each 0  . potassium chloride (K-DUR) 10 MEQ tablet Take 1 tablet (10 mEq total) by mouth daily.    Marland Kitchen thiamine (VITAMIN B-1) 100 MG tablet Take 1 tablet (100 mg total) by mouth daily.    . Amino Acids-Protein Hydrolys (FEEDING SUPPLEMENT, PRO-STAT SUGAR FREE 64,) LIQD Take 30 mLs by mouth 3 (three) times daily with meals. 900 mL 0  . doxycycline (VIBRA-TABS) 100 MG tablet TAKE 1 TABLET (100 MG TOTAL) BY MOUTH 2 (TWO) TIMES DAILY. (Patient taking differently: Take 100 mg by mouth 2 (two) times daily. ) 180 tablet 2  . feeding supplement, ENSURE ENLIVE, (ENSURE ENLIVE) LIQD Take 237 mLs by mouth 2 (two) times daily between meals. (Patient not taking: Reported on 08/12/2018) 237 mL 12  . guaiFENesin-dextromethorphan (ROBITUSSIN DM) 100-10 MG/5ML syrup Take 5  mLs by mouth every 4 (four) hours as needed for cough. 118 mL 0  . hydrOXYzine (ATARAX/VISTARIL) 10 MG tablet Take 1 tablet (10 mg total) by mouth every 6 (six) hours as needed for itching. 30 tablet 0  . methocarbamol (ROBAXIN) 500 MG tablet Take 500 mg by mouth every 8 (eight) hours as needed for muscle spasms.     No facility-administered medications prior to visit.     ROS Review of Systems  Constitutional: Positive for fatigue and unexpected weight change. Negative for diaphoresis and fever.  HENT: Negative.   Eyes: Negative for photophobia and visual disturbance.  Respiratory: Negative.   Cardiovascular: Negative for chest pain.  Gastrointestinal: Positive  for constipation. Negative for abdominal distention, abdominal pain, anal bleeding and blood in stool.  Endocrine: Negative for polyphagia and polyuria.  Genitourinary: Negative for difficulty urinating.  Musculoskeletal: Positive for arthralgias and gait problem.  Skin: Positive for color change.  Neurological: Positive for weakness.  Hematological: Bruises/bleeds easily.  Psychiatric/Behavioral: Negative.     Objective:  BP 110/70   Pulse (!) 120   Ht 5\' 8"  (1.727 m)   SpO2 98%   BMI 30.26 kg/m   BP Readings from Last 3 Encounters:  09/14/18 110/70  08/19/18 110/68  07/17/18 119/74    Wt Readings from Last 3 Encounters:  08/19/18 199 lb (90.3 kg)  07/15/18 221 lb 5.5 oz (100.4 kg)  11/04/17 228 lb (103.4 kg)    Physical Exam  Constitutional: He appears well-developed and well-nourished. No distress.  HENT:  Head: Normocephalic and atraumatic.  Right Ear: External ear normal.  Left Ear: External ear normal.  Eyes: Right eye exhibits no discharge. Left eye exhibits no discharge. No scleral icterus.  Neck: No tracheal deviation present.  Cardiovascular: Regular rhythm. Tachycardia present.  Pulmonary/Chest: Effort normal and breath sounds normal.  Abdominal: Bowel sounds are normal.  Skin: He is not diaphoretic.    Lab Results  Component Value Date   WBC 6.1 08/17/2018   HGB 7.9 (L) 08/17/2018   HCT 25.9 (L) 08/17/2018   PLT 240 08/17/2018   GLUCOSE 94 08/19/2018   ALT 17 08/13/2018   AST 30 08/13/2018   NA 134 (L) 08/19/2018   K 3.6 08/19/2018   CL 92 (L) 08/19/2018   CREATININE 0.83 08/19/2018   BUN 20 08/19/2018   CO2 30 08/19/2018   TSH 3.525 08/14/2018   INR 1.10 08/12/2018    Dg Chest 1 View  Result Date: 08/12/2018 CLINICAL DATA:  Shortness of breath EXAM: CHEST  1 VIEW COMPARISON:  Chest radiograph 07/04/2015 FINDINGS: There is shallow lung inflation. There is mild cardiomegaly. There is no focal airspace consolidation or pulmonary edema. There  is no pleural effusion or pneumothorax. IMPRESSION: Shallow lung inflation and mild cardiomegaly. Electronically Signed   By: Ulyses Jarred M.D.   On: 08/12/2018 15:13    Assessment & Plan:   Octaviano was seen today for establish care.  Diagnoses and all orders for this visit:  Essential hypertension -     CBC -     Comprehensive metabolic panel  Alcohol abuse -     Comprehensive metabolic panel  Hypomagnesemia -     Magnesium  Arthritis of right hip  Iron deficiency -     CBC -     Iron, TIBC and Ferritin Panel  Anemia, unspecified type  Tachycardia  Infection of prosthetic hip joint, subsequent encounter  Adult failure to thrive -     Amino Acids-Protein Hydrolys (  FEEDING SUPPLEMENT, PRO-STAT SUGAR FREE 64,) LIQD; Take 30 mLs by mouth 3 (three) times daily with meals.   I have discontinued Sidonie Dickens. Seever's hydrOXYzine, methocarbamol, doxycycline, feeding supplement (ENSURE ENLIVE), and guaiFENesin-dextromethorphan. I am also having him maintain his furosemide, acetaminophen, atorvastatin, folic acid, thiamine, potassium chloride, magnesium oxide, albuterol, aspirin, ferrous sulfate, multivitamin with minerals, oxyCODONE, polyethylene glycol, and feeding supplement (PRO-STAT SUGAR FREE 64).  Meds ordered this encounter  Medications  . Amino Acids-Protein Hydrolys (FEEDING SUPPLEMENT, PRO-STAT SUGAR FREE 64,) LIQD    Sig: Take 30 mLs by mouth 3 (three) times daily with meals.    Dispense:  900 mL    Refill:  0   Encouraged patient to continue his furosemide and potassium supplementation.  He will continue his doxycycline.  He will use MiraLAX for constipation.  He is to continue iron supplementation.  Thanks we will see him back in 1 week for follow-up of today's lab work and his elevated heart rate.  Patient was advised to stop all alcohol.  I am hopeful with this was the source of his elevated heart rate today.  Follow-up: Return in about 1 week (around  09/21/2018).  Libby Maw, MD

## 2018-09-14 NOTE — Patient Outreach (Signed)
Transition of care:  Placed call to patient and explained reason for call. Patient reports that he is going to see his medical doctor today and that his son is taking him. Patient reports that he has spoken with [facility] and he will have a nurse and PT.    Reviewed reason for call and patient states that he is not sure if he wants [facility] services. States to call him back at the end of the week.  PLAN: will recontact patient again later in the week to see if patient is interested in continuing services. No care plan at this time as no goals were discussed.  [name], RN, BSN, CEN [facility] [employer] [phone]

## 2018-09-15 ENCOUNTER — Telehealth: Payer: Self-pay

## 2018-09-15 ENCOUNTER — Telehealth: Payer: Self-pay | Admitting: Family Medicine

## 2018-09-15 DIAGNOSIS — F339 Major depressive disorder, recurrent, unspecified: Secondary | ICD-10-CM | POA: Diagnosis not present

## 2018-09-15 DIAGNOSIS — N400 Enlarged prostate without lower urinary tract symptoms: Secondary | ICD-10-CM | POA: Diagnosis not present

## 2018-09-15 DIAGNOSIS — I110 Hypertensive heart disease with heart failure: Secondary | ICD-10-CM | POA: Diagnosis not present

## 2018-09-15 DIAGNOSIS — E43 Unspecified severe protein-calorie malnutrition: Secondary | ICD-10-CM | POA: Diagnosis not present

## 2018-09-15 DIAGNOSIS — I5033 Acute on chronic diastolic (congestive) heart failure: Secondary | ICD-10-CM | POA: Diagnosis not present

## 2018-09-15 LAB — COMPREHENSIVE METABOLIC PANEL
ALBUMIN: 3 g/dL — AB (ref 3.5–5.2)
ALK PHOS: 132 U/L — AB (ref 39–117)
ALT: 20 U/L (ref 0–53)
AST: 32 U/L (ref 0–37)
BUN: 11 mg/dL (ref 6–23)
CO2: 24 mEq/L (ref 19–32)
Calcium: 8.7 mg/dL (ref 8.4–10.5)
Chloride: 89 mEq/L — ABNORMAL LOW (ref 96–112)
Creatinine, Ser: 0.77 mg/dL (ref 0.40–1.50)
GFR: 105.75 mL/min (ref >60.00)
Glucose, Bld: 104 mg/dL — ABNORMAL HIGH (ref 70–99)
POTASSIUM: 4.4 meq/L (ref 3.5–5.1)
Sodium: 126 mEq/L — ABNORMAL LOW (ref 135–145)
TOTAL PROTEIN: 6.7 g/dL (ref 6.0–8.3)
Total Bilirubin: 0.6 mg/dL (ref 0.2–1.2)

## 2018-09-15 LAB — CBC
HEMATOCRIT: 28.1 % — AB (ref 39.0–52.0)
HEMOGLOBIN: 9.2 g/dL — AB (ref 13.0–17.0)
MCHC: 32.9 g/dL (ref 30.0–36.0)
MCV: 91.3 fl (ref 78.0–100.0)
PLATELETS: 267 10*3/uL (ref 150.0–400.0)
RBC: 3.08 Mil/uL — AB (ref 4.22–5.81)
RDW: 16 % — ABNORMAL HIGH (ref 11.5–15.5)
WBC: 9 10*3/uL (ref 4.0–10.5)

## 2018-09-15 LAB — IRON,TIBC AND FERRITIN PANEL
%SAT: 9 % — AB (ref 20–48)
Ferritin: 126 ng/mL (ref 24–380)
IRON: 21 ug/dL — AB (ref 50–180)
TIBC: 241 mcg/dL (calc) — ABNORMAL LOW (ref 250–425)

## 2018-09-15 LAB — MAGNESIUM: Magnesium: 1.5 mg/dL (ref 1.5–2.5)

## 2018-09-15 NOTE — Telephone Encounter (Signed)
Okay for verbals °

## 2018-09-15 NOTE — Telephone Encounter (Signed)
Okay 

## 2018-09-15 NOTE — Telephone Encounter (Signed)
Copied from [place] [phone] [phone]. Topic: General - Other >> Sep 15, 2018  3:11 PM Yvette Rack wrote: Reason for CRM: Langley Gauss with [facility] requests verbal orders for Physical Therapy, Occupational Therapy, Skilled Nursing, and Home Health Aid. Cb# [phone]

## 2018-09-15 NOTE — Telephone Encounter (Signed)
Copied from [place] [id number]. Topic: General - Other >> Sep 15, 2018  3:16 PM Mcneil, Ja-Kwan wrote: Reason for CRM: Langley Gauss with [facility] states she would like to make Dr Ethelene Hal aware that the pt just came home from rehab at [facility] and it looks as though the Rx for furosemide (LASIX) 40 MG tablet was changed to 40 mg in the morning and 20 mg at night. Langley Gauss also stated that the pt is taking Doxcycline 100 mg twice daily. Cb# [phone]

## 2018-09-15 NOTE — Telephone Encounter (Signed)
Okay for PT

## 2018-09-16 ENCOUNTER — Encounter: Payer: Self-pay | Admitting: *Deleted

## 2018-09-16 ENCOUNTER — Other Ambulatory Visit: Payer: Self-pay | Admitting: *Deleted

## 2018-09-16 NOTE — Patient Outreach (Signed)
[facility]) Care Management  09/16/2018  Jesus Daniel June 15, 1946 [id number]   CSW was able to make contact with patient today to follow-up regarding patient's recent discharge from [facility] [place] where patient was residing to receive short-term rehabilitative services.  Patient reports doing well since having been discharged back home.  Patient indicated that he has a great appetite, having just finished breakfast and cleaning up the kitchen.  Patient stated that home health services have been arranged for him through [facility], in the form of a nurse and physical therapist.  Patient reported that his son, [name] is able to transport him to and from all his physician appointments, makes sure that he has plenty of food to eat, assists with setting up his pill box, etc.  Patient went on to say that Mr. Hilligoss checks in with him on a daily basis to ensure that he has everything he needs.  Patient's other son, [name] is also available to assist patient in the home, if necessary, and is extremely supportive of patient.  Patient feels confident that he will be successful in the home.  CSW will perform a case closure on patient, as all goals of treatment have been met from social work standpoint and no additional social work needs have been identified at this time.  CSW will notify patient's RNCM with [place] [facility], [name] of CSW's plans to close patient's case.  [name] will continue to follow patient in the community for disease management services.  CSW will fax an update to patient's Primary Care Physician, Dr. Abelino Derrick to ensure that they are aware of CSW's involvement with patient's plan of care.   Nat Christen, BSW, MSW, LCSW  Licensed Education officer, [facility]  Mailing [place] [address] [place],  [place] [address] Physical Address-[address], [place], [place] [address] Toll Free Main # [phone] Fax # [phone] Cell # [phone]  Office # [phone] [email or web address]

## 2018-09-16 NOTE — Telephone Encounter (Signed)
Verbal given 

## 2018-09-18 ENCOUNTER — Other Ambulatory Visit: Payer: Self-pay

## 2018-09-18 ENCOUNTER — Telehealth: Payer: Self-pay | Admitting: Family Medicine

## 2018-09-18 NOTE — Telephone Encounter (Signed)
Copied from [place] [phone]. Topic: Quick Communication - Home Health Verbal Orders >> Sep 18, 2018  1:54 PM Berneta Levins wrote: Caller/Agency: Tommi Emery with [facility] Number: [phone], OK to leave a message Requesting OT/PT/Skilled Nursing/Social Work: OT for ADL, IADL, exercise for Dc1 goals met for maximum potential Frequency: 1x week for 2 weeks

## 2018-09-18 NOTE — Telephone Encounter (Signed)
Yes

## 2018-09-18 NOTE — Telephone Encounter (Signed)
Verbal given 

## 2018-09-18 NOTE — Patient Outreach (Signed)
Transition of care:  Placed call to patient per patients request. Patient reports that he has not yet decided if he wants to be involved with [facility]. I reviewed with patient that he saw Social worker while at the nursing facility. Patient reports that he is active with [facility].  I reviewed case management benefit of his insurance and patient request I call him back next week and he will make a decision.   PLAN: not opened as patient has not decided on interest in [facility] program at this time. Will call back in 5-7 days per patient request.  [name], RN, BSN, CEN [place] Coordinator [phone]

## 2018-09-18 NOTE — Telephone Encounter (Signed)
Okay for verbal 

## 2018-09-21 ENCOUNTER — Other Ambulatory Visit: Payer: Self-pay | Admitting: Family Medicine

## 2018-09-21 DIAGNOSIS — E43 Unspecified severe protein-calorie malnutrition: Secondary | ICD-10-CM | POA: Diagnosis not present

## 2018-09-21 DIAGNOSIS — I110 Hypertensive heart disease with heart failure: Secondary | ICD-10-CM | POA: Diagnosis not present

## 2018-09-21 DIAGNOSIS — I5033 Acute on chronic diastolic (congestive) heart failure: Secondary | ICD-10-CM | POA: Diagnosis not present

## 2018-09-21 DIAGNOSIS — N400 Enlarged prostate without lower urinary tract symptoms: Secondary | ICD-10-CM | POA: Diagnosis not present

## 2018-09-21 DIAGNOSIS — F339 Major depressive disorder, recurrent, unspecified: Secondary | ICD-10-CM | POA: Diagnosis not present

## 2018-09-21 NOTE — Telephone Encounter (Signed)
Patient needs to see pain management for this med.

## 2018-09-21 NOTE — Telephone Encounter (Signed)
Copied from [place] [phone]. Topic: Quick Communication - Rx Refill/Question >> Sep 21, 2018 10:50 AM Nils Flack wrote: Medication:  oxyCODONE (OXY IR/ROXICODONE) 5 MG immediate release tablet, and methocarbamol  Has the patient contacted their pharmacy? No. (Agent: If no, request that the patient contact the pharmacy for the refill.) (Agent: If yes, when and what did the pharmacy advise?) control   Preferred Pharmacy (with phone number or street name): [facility] [address]  Agent: Please be advised that RX refills may take up to 3 business days. We ask that you follow-up with your pharmacy.

## 2018-09-21 NOTE — Telephone Encounter (Signed)
Requested medication (s) are due for refill today: yes  Requested medication (s) are on the active medication list: yes  Last refill:  08/19/18  Future visit scheduled: y  Notes to clinic:  undelegated    Requested Prescriptions  Pending Prescriptions Disp Refills   oxyCODONE (OXY IR/ROXICODONE) 5 MG immediate release tablet 3 tablet 0    Sig: Take 0.5 tablets (2.5 mg total) by mouth 2 (two) times daily as needed for moderate pain. *Hold for sedation*     Not Delegated - Analgesics:  Opioid Agonists Failed - 09/21/2018 12:27 PM      Failed - This refill cannot be delegated      Failed - Urine Drug Screen completed in last 360 days.      Passed - Valid encounter within last 6 months    Recent Outpatient Visits          1 week ago Essential hypertension   [facility], Mortimer Fries, MD   6 years ago HTN (hypertension)   [facility], MD      Future Appointments            In 4 days Ethelene Hal Mortimer Fries, MD [facility] [place] [place]   In 9 months Johnnye Sima, Doroteo Bradford, MD [facility]

## 2018-09-22 NOTE — Telephone Encounter (Signed)
Patient states that he is not going back to pain mgt. He said that is where all his problems started. He would like to speak to a nurse. He is also requesting a refill on his methocarbamol (ROBAXIN) 500 MG DISCONTINUED>

## 2018-09-23 NOTE — Telephone Encounter (Signed)
I spoke with patient & updated him that we would discuss this during his visit on Friday, patient was very understanding about waiting & will be here on Friday at [place].

## 2018-09-24 ENCOUNTER — Other Ambulatory Visit: Payer: Self-pay

## 2018-09-24 NOTE — Patient Outreach (Signed)
Transition of care:  Placed call to patient again to discuss interest in [facility] services. Patient reports that he is still undecided.  Reports that he wants to talk to his doctor tomorrow at office visit and then he will decide.  PLAN: patient request return call on Monday 09/28/2018.  [name], RN, BSN, CEN [facility] [employer] [phone]

## 2018-09-25 ENCOUNTER — Encounter: Payer: Self-pay | Admitting: Family Medicine

## 2018-09-25 ENCOUNTER — Ambulatory Visit (INDEPENDENT_AMBULATORY_CARE_PROVIDER_SITE_OTHER): Payer: PPO | Admitting: Family Medicine

## 2018-09-25 VITALS — BP 126/80 | HR 75 | Ht 68.0 in

## 2018-09-25 DIAGNOSIS — E611 Iron deficiency: Secondary | ICD-10-CM

## 2018-09-25 DIAGNOSIS — T8459XD Infection and inflammatory reaction due to other internal joint prosthesis, subsequent encounter: Secondary | ICD-10-CM

## 2018-09-25 DIAGNOSIS — Z96649 Presence of unspecified artificial hip joint: Secondary | ICD-10-CM

## 2018-09-25 DIAGNOSIS — D649 Anemia, unspecified: Secondary | ICD-10-CM

## 2018-09-25 DIAGNOSIS — M25551 Pain in right hip: Secondary | ICD-10-CM | POA: Diagnosis not present

## 2018-09-25 MED ORDER — METHOCARBAMOL 500 MG PO TABS: 500.0000 mg | ORAL_TABLET | Freq: Three times a day (TID) | ORAL | 2 refills | Status: DC | PRN

## 2018-09-25 MED ORDER — OXYCODONE HCL 5 MG PO TABS: 2.5000 mg | ORAL_TABLET | Freq: Two times a day (BID) | ORAL | 0 refills | Status: DC | PRN

## 2018-09-25 NOTE — Progress Notes (Signed)
Subjective:  Patient ID: Jesus Daniel, male    DOB: December 03, 1946  Age: 72 y.o. MRN: [id number]  CC: Follow-up   HPI Jesus Daniel presents for pain management of right knee and right hip pain.  Pain is worse at night when he goes to lie down.  The pain starts in his knee and seems to radiate up into the right hip.  This is been well managed with 2.5 mg of oxycodone taking twice daily.  He also uses Robaxin 500 mg taken also twice daily.  He is currently working with a physical therapist who is helping to Jesus Daniel to improve his strength and range of motion.  He is hopeful that he will be able to move from using a wheelchair to a walker.  He is not currently drinking any alcohol and there is no alcohol in the house his son tells me.  He is currently having no chest pain shortness of breath nausea or vomiting.  Outpatient Medications Prior to Visit  Medication Sig Dispense Refill  . acetaminophen (TYLENOL) 500 MG tablet Take 500 mg by mouth every 6 (six) hours as needed for mild pain.     Marland Kitchen albuterol (PROVENTIL) (2.5 MG/3ML) 0.083% nebulizer solution Take 2.5 mg by nebulization every 4 (four) hours as needed for wheezing or shortness of breath.     . Amino Acids-Protein Hydrolys (FEEDING SUPPLEMENT, PRO-STAT SUGAR FREE 64,) LIQD Take 30 mLs by mouth 3 (three) times daily with meals. 900 mL 0  . aspirin 81 MG chewable tablet Chew 1 tablet (81 mg total) by mouth daily. 30 tablet 0  . atorvastatin (LIPITOR) 10 MG tablet Take 10 mg by mouth daily.  3  . ferrous sulfate 325 (65 FE) MG tablet Take 1 tablet (325 mg total) by mouth 3 (three) times daily with meals.  3  . folic acid (FOLVITE) 1 MG tablet Take 1 tablet (1 mg total) by mouth daily.  3  . furosemide (LASIX) 40 MG tablet Take 40 mg by mouth in the morning and 20 mg in the evening.     . magnesium oxide (MAG-OX) 400 MG tablet Take 1 tablet (400 mg total) by mouth daily.    . Multiple Vitamin (MULTIVITAMIN WITH MINERALS) TABS tablet Take 1 tablet by  mouth daily with breakfast.    . polyethylene glycol (MIRALAX / GLYCOLAX) packet Take 17 g by mouth daily as needed for mild constipation. 14 each 0  . potassium chloride (K-DUR) 10 MEQ tablet Take 1 tablet (10 mEq total) by mouth daily.    Marland Kitchen thiamine (VITAMIN B-1) 100 MG tablet Take 1 tablet (100 mg total) by mouth daily.    . methocarbamol (ROBAXIN) 500 MG tablet Take 500 mg by mouth every 8 (eight) hours as needed for muscle spasms.    Marland Kitchen oxyCODONE (OXY IR/ROXICODONE) 5 MG immediate release tablet Take 0.5 tablets (2.5 mg total) by mouth 2 (two) times daily as needed for moderate pain. *Hold for sedation* 3 tablet 0   No facility-administered medications prior to visit.     ROS Review of Systems  Constitutional: Negative for diaphoresis, fever and unexpected weight change.  HENT: Negative.   Respiratory: Negative.   Cardiovascular: Negative.   Gastrointestinal: Negative.   Endocrine: Negative for polyphagia and polyuria.  Musculoskeletal: Positive for arthralgias, gait problem and joint swelling.  Neurological: Positive for weakness. Negative for light-headedness.  Hematological: Does not bruise/bleed easily.  Psychiatric/Behavioral: Negative.     Objective:  BP 126/80  Pulse 75   Ht 5\' 8"  (1.727 m)   SpO2 98%   BMI 30.26 kg/m   BP Readings from Last 3 Encounters:  09/25/18 126/80  09/14/18 110/70  08/19/18 110/68    Wt Readings from Last 3 Encounters:  08/19/18 199 lb (90.3 kg)  07/15/18 221 lb 5.5 oz (100.4 kg)  11/04/17 228 lb (103.4 kg)    Physical Exam  Constitutional: He is oriented to person, place, and time. He appears well-developed and well-nourished. No distress.  HENT:  Head: Normocephalic and atraumatic.  Right Ear: External ear normal.  Left Ear: External ear normal.  Eyes: Right eye exhibits no discharge. Left eye exhibits no discharge. No scleral icterus.  Neck: No JVD present. No tracheal deviation present.  Cardiovascular: Normal rate and  regular rhythm.  Occasional extrasystoles are present.  Pulmonary/Chest: Effort normal and breath sounds normal.  Musculoskeletal: He exhibits edema.  Neurological: He is alert and oriented to person, place, and time.  Skin: Skin is warm and dry. He is not diaphoretic.  Psychiatric: He has a normal mood and affect. His behavior is normal.    Lab Results  Component Value Date   WBC 9.0 09/14/2018   HGB 9.2 (L) 09/14/2018   HCT 28.1 (L) 09/14/2018   PLT 267.0 09/14/2018   GLUCOSE 104 (H) 09/14/2018   ALT 20 09/14/2018   AST 32 09/14/2018   NA 126 (L) 09/14/2018   K 4.4 09/14/2018   CL 89 (L) 09/14/2018   CREATININE 0.77 09/14/2018   BUN 11 09/14/2018   CO2 24 09/14/2018   TSH 3.525 08/14/2018   INR 1.10 08/12/2018    Dg Chest 1 View  Result Date: 08/12/2018 CLINICAL DATA:  Shortness of breath EXAM: CHEST  1 VIEW COMPARISON:  Chest radiograph 07/04/2015 FINDINGS: There is shallow lung inflation. There is mild cardiomegaly. There is no focal airspace consolidation or pulmonary edema. There is no pleural effusion or pneumothorax. IMPRESSION: Shallow lung inflation and mild cardiomegaly. Electronically Signed   By: Ulyses Jarred M.D.   On: 08/12/2018 15:13    Assessment & Plan:   Jesus Daniel was seen today for follow-up.  Diagnoses and all orders for this visit:  Right hip pain -     methocarbamol (ROBAXIN) 500 MG tablet; Take 1 tablet (500 mg total) by mouth every 8 (eight) hours as needed for muscle spasms. -     oxyCODONE (OXY IR/ROXICODONE) 5 MG immediate release tablet; Take 0.5 tablets (2.5 mg total) by mouth 2 (two) times daily as needed for moderate pain. *Hold for sedation* -     Pain Mgmt, Profile 8 w/Conf, U  Infection of prosthetic hip joint, subsequent encounter  Iron deficiency  Anemia, unspecified type   I have changed Jesus Daniel methocarbamol. I am also having Jesus Daniel maintain his furosemide, acetaminophen, atorvastatin, folic acid, thiamine, potassium  chloride, magnesium oxide, albuterol, aspirin, ferrous sulfate, multivitamin with minerals, polyethylene glycol, feeding supplement (PRO-STAT SUGAR FREE 64), and oxyCODONE.  Meds ordered this encounter  Medications  . methocarbamol (ROBAXIN) 500 MG tablet    Sig: Take 1 tablet (500 mg total) by mouth every 8 (eight) hours as needed for muscle spasms.    Dispense:  60 tablet    Refill:  2  . oxyCODONE (OXY IR/ROXICODONE) 5 MG immediate release tablet    Sig: Take 0.5 tablets (2.5 mg total) by mouth 2 (two) times daily as needed for moderate pain. *Hold for sedation*    Dispense:  30 tablet  Refill:  0   Patient signed a pain contract today.  Told Jesus Daniel in no uncertain terms that if I ever were to smell alcohol on his breath, find it in his system or hear about an alcohol related admission, the pain medicine would stop.  He told me that he could agreed to do that.  We will recheck his iron levels in 1 month.  Follow-up: Return in about 1 month (around 10/26/2018), or return in one month to recheck iron levels. Libby Maw, MD

## 2018-09-28 ENCOUNTER — Other Ambulatory Visit: Payer: Self-pay

## 2018-09-28 DIAGNOSIS — I5033 Acute on chronic diastolic (congestive) heart failure: Secondary | ICD-10-CM | POA: Diagnosis not present

## 2018-09-28 DIAGNOSIS — F339 Major depressive disorder, recurrent, unspecified: Secondary | ICD-10-CM | POA: Diagnosis not present

## 2018-09-28 DIAGNOSIS — N400 Enlarged prostate without lower urinary tract symptoms: Secondary | ICD-10-CM | POA: Diagnosis not present

## 2018-09-28 DIAGNOSIS — E43 Unspecified severe protein-calorie malnutrition: Secondary | ICD-10-CM | POA: Diagnosis not present

## 2018-09-28 DIAGNOSIS — I110 Hypertensive heart disease with heart failure: Secondary | ICD-10-CM | POA: Diagnosis not present

## 2018-09-28 NOTE — Patient Outreach (Signed)
Case closure:  Placed call to patient today. Patient reports that he is not interested in [facility] services. He has been hesitant and undecided for weeks.  PLAN: will close case and send letter with my contact information in case he changes his mind in the future.    [name], RN, BSN, CEN [facility] [employer] [phone]

## 2018-09-29 ENCOUNTER — Telehealth: Payer: Self-pay

## 2018-09-29 LAB — PAIN MGMT, PROFILE 8 W/CONF, U
6 Acetylmorphine: NEGATIVE ng/mL (ref <10)
AMPHETAMINES: NEGATIVE ng/mL (ref <500)
Alcohol Metabolites: NEGATIVE ng/mL (ref <500)
BENZODIAZEPINES: NEGATIVE ng/mL (ref <100)
BUPRENORPHINE, URINE: NEGATIVE ng/mL (ref <5)
COCAINE METABOLITE: NEGATIVE ng/mL (ref <150)
CREATININE: 137.9 mg/dL
Codeine: NEGATIVE ng/mL (ref <50)
HYDROMORPHONE: 2452 ng/mL — AB (ref <50)
Hydrocodone: 1274 ng/mL — ABNORMAL HIGH (ref <50)
MDMA: NEGATIVE ng/mL (ref <500)
Marijuana Metabolite: NEGATIVE ng/mL (ref <20)
Morphine: NEGATIVE ng/mL (ref <50)
NOROXYCODONE: NEGATIVE ng/mL (ref <50)
Norhydrocodone: 833 ng/mL — ABNORMAL HIGH (ref <50)
OXYMORPHONE: 276 ng/mL — AB (ref <50)
Opiates: POSITIVE ng/mL — AB (ref <100)
Oxidant: NEGATIVE ug/mL (ref <200)
Oxycodone: NEGATIVE ng/mL (ref <50)
Oxycodone: POSITIVE ng/mL — AB (ref <100)
PH: 6.29 (ref 4.5–9.0)

## 2018-09-29 NOTE — Telephone Encounter (Signed)
Verbal ok?      Copied from [place] [phone]. Topic: General - Other >> Sep 28, 2018  3:56 PM Parke Poisson wrote: Reason for CRM: Langley Gauss from [place] would like approval for one additional nurse visit to help pt with medications

## 2018-09-29 NOTE — Telephone Encounter (Signed)
Verbal given 

## 2018-09-29 NOTE — Telephone Encounter (Signed)
Okay 

## 2018-10-05 ENCOUNTER — Other Ambulatory Visit: Payer: Self-pay | Admitting: Family Medicine

## 2018-10-05 DIAGNOSIS — R627 Adult failure to thrive: Secondary | ICD-10-CM

## 2018-10-05 DIAGNOSIS — I5033 Acute on chronic diastolic (congestive) heart failure: Secondary | ICD-10-CM | POA: Diagnosis not present

## 2018-10-05 DIAGNOSIS — M25551 Pain in right hip: Secondary | ICD-10-CM

## 2018-10-05 DIAGNOSIS — I110 Hypertensive heart disease with heart failure: Secondary | ICD-10-CM | POA: Diagnosis not present

## 2018-10-05 DIAGNOSIS — N400 Enlarged prostate without lower urinary tract symptoms: Secondary | ICD-10-CM | POA: Diagnosis not present

## 2018-10-05 DIAGNOSIS — E43 Unspecified severe protein-calorie malnutrition: Secondary | ICD-10-CM | POA: Diagnosis not present

## 2018-10-05 DIAGNOSIS — F339 Major depressive disorder, recurrent, unspecified: Secondary | ICD-10-CM | POA: Diagnosis not present

## 2018-10-05 NOTE — Telephone Encounter (Signed)
Copied from [place] [phone]. Topic: Quick Communication - Rx Refill/Question >> Oct 05, 2018  3:31 PM Leward Quan A wrote: Medication: furosemide (LASIX) 40 MG tablet,   magnesium oxide (MAG-OX) 400 MG tablet, potassium chloride (K-DUR) 10 MEQ tablet,  Amino Acids-Protein Hydrolys (FEEDING SUPPLEMENT, PRO-STAT SUGAR FREE 64,) LIQD,  atorvastatin (LIPITOR) 10 MG tablet, ferrous sulfate 325 (65 FE) MG tablet,  thiamine (VITAMIN B-1) 290 MG tablet, folic acid (FOLVITE) 1 MG tablet  Has the patient contacted their pharmacy? No.   Preferred Pharmacy (with phone number or street name): [facility] [place] [phone] (Phone) [phone] (Fax)    Agent: Please be advised that RX refills may take up to 3 business days. We ask that you follow-up with your pharmacy.

## 2018-10-05 NOTE — Telephone Encounter (Signed)
Copied from [place] #[id number]. Topic: Quick Communication - Rx Refill/Question >> Oct 05, 2018 11:09 AM Charlynn Court wrote: Medication: methocarbamol (ROBAXIN) 500 MG tablet  Has the patient contacted their pharmacy? Yes.   (Agent: If no, request that the patient contact the pharmacy for the refill.) (Agent: If yes, when and what did the pharmacy advise?)  Preferred Pharmacy (with phone number or street name): switching to mail order [email or web address], Main number - [phone]  fax: [phone]  Agent: Please be advised that RX refills may take up to 3 business days. We ask that you follow-up with your pharmacy.

## 2018-10-06 MED ORDER — FERROUS SULFATE 325 (65 FE) MG PO TABS: 325.0000 mg | ORAL_TABLET | Freq: Three times a day (TID) | ORAL | 3 refills | Status: DC

## 2018-10-06 MED ORDER — POTASSIUM CHLORIDE ER 10 MEQ PO TBCR: 10.0000 meq | EXTENDED_RELEASE_TABLET | Freq: Every day | ORAL | Status: DC

## 2018-10-06 MED ORDER — MAGNESIUM OXIDE 400 MG PO TABS: 400.0000 mg | ORAL_TABLET | Freq: Every day | ORAL | Status: DC

## 2018-10-06 MED ORDER — ATORVASTATIN CALCIUM 10 MG PO TABS: 10.0000 mg | ORAL_TABLET | Freq: Every day | ORAL | 1 refills | Status: DC

## 2018-10-06 MED ORDER — FOLIC ACID 1 MG PO TABS: 1.0000 mg | ORAL_TABLET | Freq: Every day | ORAL | 3 refills | Status: DC

## 2018-10-06 MED ORDER — METHOCARBAMOL 500 MG PO TABS: 500.0000 mg | ORAL_TABLET | Freq: Three times a day (TID) | ORAL | 2 refills | Status: DC | PRN

## 2018-10-06 MED ORDER — VITAMIN B-1 100 MG PO TABS: 100.0000 mg | ORAL_TABLET | Freq: Every day | ORAL | Status: DC

## 2018-10-06 MED ORDER — FUROSEMIDE 40 MG PO TABS: ORAL_TABLET | ORAL | 2 refills | Status: DC

## 2018-10-06 MED ORDER — PRO-STAT SUGAR FREE PO LIQD: 30.0000 mL | Freq: Three times a day (TID) | ORAL | 0 refills | Status: AC

## 2018-10-06 NOTE — Telephone Encounter (Signed)
Requested medication (s) are due for refill today: yes  Requested medication (s) are on the active medication list: yes  Last refill:  Last filled by different providers  Future visit scheduled: yes 10/30/18  Notes to clinic:  Pt requesting medications previously filled by a different provider. LOV 09/25/18    Requested Prescriptions  Pending Prescriptions Disp Refills   Amino Acids-Protein Hydrolys (FEEDING SUPPLEMENT, PRO-STAT SUGAR FREE 64,) LIQD 900 mL 0    Sig: Take 30 mLs by mouth 3 (three) times daily with meals.     There is no refill protocol information for this order     atorvastatin (LIPITOR) 10 MG tablet  3    Sig: Take 1 tablet (10 mg total) by mouth daily.     Cardiovascular:  Antilipid - Statins Failed - 10/05/2018  5:13 PM      Failed - Total Cholesterol in normal range and within 360 days    No results found for: CHOL, POCCHOL       Failed - LDL in normal range and within 360 days    No results found for: LDLCALC, LDLC, HIRISKLDL       Failed - HDL in normal range and within 360 days    No results found for: HDL       Failed - Triglycerides in normal range and within 360 days    No results found for: TRIG       Passed - Patient is not pregnant      Passed - Valid encounter within last 12 months    Recent Outpatient Visits          1 week ago Right hip pain   [facility], Mortimer Fries, MD   3 weeks ago Essential hypertension   [facility], Mortimer Fries, MD   6 years ago HTN (hypertension)   [facility] Care at [facility], Benn Moulder, MD      Future Appointments            In 3 weeks Libby Maw, MD [facility], [place]   In 9 months Johnnye Sima, Doroteo Bradford, MD [facility], [facility]          ferrous sulfate 325 (65 FE) MG tablet  3    Sig: Take 1 tablet (325 mg total) by mouth 3 (three) times daily with meals.     Endocrinology:   Minerals - Iron Supplementation Failed - 10/05/2018  5:13 PM      Failed - HGB in normal range and within 360 days    Hemoglobin  Date Value Ref Range Status  09/14/2018 9.2 (L) 13.0 - 17.0 g/dL Final         Failed - HCT in normal range and within 360 days    HCT  Date Value Ref Range Status  09/14/2018 28.1 (L) 39.0 - 52.0 % Final         Failed - RBC in normal range and within 360 days    RBC  Date Value Ref Range Status  09/14/2018 3.08 (L) 4.22 - 5.81 Mil/uL Final         Failed - Fe (serum) in normal range and within 360 days    Iron  Date Value Ref Range Status  09/14/2018 21 (L) 50 - 180 mcg/dL Final   %SAT  Date Value Ref Range Status  09/14/2018 9 (L) 20 - 48 % (calc) Final  Passed - Ferritin in normal range and within 360 days    Ferritin  Date Value Ref Range Status  09/14/2018 126 24 - 380 ng/mL Final         Passed - Valid encounter within last 12 months    Recent Outpatient Visits          1 week ago Right hip pain   [facility], Mortimer Fries, MD   3 weeks ago Essential hypertension   [facility], Mortimer Fries, MD   6 years ago HTN (hypertension)   Primary Care at [facility], Benn Moulder, MD      Future Appointments            In 3 weeks Ethelene Hal Mortimer Fries, MD [facility], [place]   In 9 months Johnnye Sima, Doroteo Bradford, MD [facility], [facility]          folic acid (FOLVITE) 1 MG tablet  3    Sig: Take 1 tablet (1 mg total) by mouth daily.     Endocrinology:  Vitamins Passed - 10/05/2018  5:13 PM      Passed - Valid encounter within last 12 months    Recent Outpatient Visits          1 week ago Right hip pain   [facility], Mortimer Fries, MD   3 weeks ago Essential hypertension   [facility], Mortimer Fries, MD   6 years ago HTN (hypertension)   Primary Care at  [facility], Benn Moulder, MD      Future Appointments            In 3 weeks Libby Maw, MD [facility], [place]   In 9 months Johnnye Sima, Doroteo Bradford, MD [facility] for Infectious Disease, [facility]          furosemide (LASIX) 40 MG tablet 30 tablet     Sig: Take 40 mg by mouth in the morning and 20 mg in the evening.     Cardiovascular:  Diuretics - Loop Failed - 10/05/2018  5:13 PM      Failed - Na in normal range and within 360 days    Sodium  Date Value Ref Range Status  09/14/2018 126 (L) 135 - 145 mEq/L Final  04/09/2016 139 137 - 147 mmol/L Final         Passed - K in normal range and within 360 days    Potassium  Date Value Ref Range Status  09/14/2018 4.4 3.5 - 5.1 mEq/L Final         Passed - Ca in normal range and within 360 days    Calcium  Date Value Ref Range Status  09/14/2018 8.7 8.4 - 10.5 mg/dL Final         Passed - Cr in normal range and within 360 days    Creat  Date Value Ref Range Status  03/09/2018 0.77 0.70 - 1.18 mg/dL Final    Comment:    For patients >71 years of age, the reference limit for Creatinine is approximately 13% higher for people identified as African-American. .    Creatinine, Ser  Date Value Ref Range Status  09/14/2018 0.77 0.40 - 1.50 mg/dL Final         Passed - Last BP in normal range    BP Readings from Last 1 Encounters:  09/25/18 126/80  Passed - Valid encounter within last 6 months    Recent Outpatient Visits          1 week ago Right hip pain   [facility], Mortimer Fries, MD   3 weeks ago Essential hypertension   [facility], Mortimer Fries, MD   6 years ago HTN (hypertension)   Primary Care at [facility], Benn Moulder, MD      Future Appointments            In 3 weeks Ethelene Hal Mortimer Fries, MD [facility], [place]   In 9 months Johnnye Sima, Doroteo Bradford, MD [facility] for  Infectious Disease, [facility]          magnesium oxide (MAG-OX) 400 MG tablet      Sig: Take 1 tablet (400 mg total) by mouth daily.     Endocrinology:  Minerals - Magnesium Supplementation Passed - 10/05/2018  5:13 PM      Passed - Mg Level in normal range and within 360 days    Magnesium  Date Value Ref Range Status  09/14/2018 1.5 1.5 - 2.5 mg/dL Final         Passed - Valid encounter within last 12 months    Recent Outpatient Visits          1 week ago Right hip pain   [facility], Mortimer Fries, MD   3 weeks ago Essential hypertension   [facility], Mortimer Fries, MD   6 years ago HTN (hypertension)   Primary Care at [facility], Benn Moulder, MD      Future Appointments            In 3 weeks Ethelene Hal Mortimer Fries, MD [facility], [place]   In 9 months Johnnye Sima, Doroteo Bradford, MD [facility] for Infectious Disease, [facility]          potassium chloride (K-DUR) 10 MEQ tablet      Sig: Take 1 tablet (10 mEq total) by mouth daily.     Endocrinology:  Minerals - Potassium Supplementation Passed - 10/05/2018  5:13 PM      Passed - K in normal range and within 360 days    Potassium  Date Value Ref Range Status  09/14/2018 4.4 3.5 - 5.1 mEq/L Final         Passed - Cr in normal range and within 360 days    Creat  Date Value Ref Range Status  03/09/2018 0.77 0.70 - 1.18 mg/dL Final    Comment:    For patients >58 years of age, the reference limit for Creatinine is approximately 13% higher for people identified as African-American. .    Creatinine, Ser  Date Value Ref Range Status  09/14/2018 0.77 0.40 - 1.50 mg/dL Final         Passed - Valid encounter within last 12 months    Recent Outpatient Visits          1 week ago Right hip pain   [facility], Mortimer Fries, MD   3 weeks ago Essential hypertension   [facility],  Mortimer Fries, MD   6 years ago HTN (hypertension)   Primary Care at [facility], Benn Moulder, MD      Future Appointments            In 3 weeks Ethelene Hal Mortimer Fries, MD [facility]  [employer], [place]   In 9 months Hat[place], Doroteo Bradford, MD [facility] for [facility], [facility]          thiamine (VITAMIN B-1) 100 MG tablet      Sig: Take 1 tablet (100 mg total) by mouth daily.     Off-Protocol Failed - 10/05/2018  5:13 PM      Failed - Medication not assigned to a protocol, review manually.      Passed - Valid encounter within last 12 months    Recent Outpatient Visits          1 week ago Right hip pain   [facility], Mortimer Fries, MD   3 weeks ago Essential hypertension   [facility], Mortimer Fries, MD   6 years ago HTN (hypertension)   Primary Care at [facility], Benn Moulder, MD      Future Appointments            In 3 weeks Ethelene Hal Mortimer Fries, MD [facility] [place], [place]   In 9 months Johnnye Sima, Doroteo Bradford, MD [facility] for Infectious Disease, [facility]

## 2018-10-06 NOTE — Telephone Encounter (Signed)
Requested medication (s) are due for refill today: yes  Requested medication (s) are on the active medication list: yes  Last refill:  09/25/18  Future visit scheduled: yes  Notes to clinic:  undelegated    Requested Prescriptions  Pending Prescriptions Disp Refills   methocarbamol (ROBAXIN) 500 MG tablet 60 tablet 2    Sig: Take 1 tablet (500 mg total) by mouth every 8 (eight) hours as needed for muscle spasms.     Not Delegated - Analgesics:  Muscle Relaxants Failed - 10/05/2018  2:22 PM      Failed - This refill cannot be delegated      Passed - Valid encounter within last 6 months    Recent Outpatient Visits          1 week ago Right hip pain   [facility], Mortimer Fries, MD   3 weeks ago Essential hypertension   [facility], Mortimer Fries, MD   6 years ago HTN (hypertension)   Primary Care at [facility], Benn Moulder, MD      Future Appointments            In 3 weeks Ethelene Hal Mortimer Fries, MD [facility] [place], [place]   In 9 months Johnnye Sima, Doroteo Bradford, MD [facility] for Infectious Disease, [facility]

## 2018-10-12 DIAGNOSIS — I110 Hypertensive heart disease with heart failure: Secondary | ICD-10-CM | POA: Diagnosis not present

## 2018-10-12 DIAGNOSIS — I5033 Acute on chronic diastolic (congestive) heart failure: Secondary | ICD-10-CM | POA: Diagnosis not present

## 2018-10-12 DIAGNOSIS — N400 Enlarged prostate without lower urinary tract symptoms: Secondary | ICD-10-CM | POA: Diagnosis not present

## 2018-10-12 DIAGNOSIS — E43 Unspecified severe protein-calorie malnutrition: Secondary | ICD-10-CM | POA: Diagnosis not present

## 2018-10-12 DIAGNOSIS — F339 Major depressive disorder, recurrent, unspecified: Secondary | ICD-10-CM | POA: Diagnosis not present

## 2018-10-15 NOTE — Telephone Encounter (Signed)
Ok to extend again.

## 2018-10-15 NOTE — Telephone Encounter (Signed)
Verbal given 

## 2018-10-15 NOTE — Telephone Encounter (Signed)
Verbal okay? 

## 2018-10-15 NOTE — Telephone Encounter (Signed)
Kumar calling with [facility] called and stated that he would like a verbal to extend Ot for 1x1 2x2 1x2. Please advise Cb#[phone]. Ok to leave a message. Please advise.

## 2018-10-20 ENCOUNTER — Telehealth: Payer: Self-pay

## 2018-10-20 DIAGNOSIS — M25551 Pain in right hip: Secondary | ICD-10-CM

## 2018-10-20 MED ORDER — METHOCARBAMOL 500 MG PO TABS: 500.0000 mg | ORAL_TABLET | Freq: Three times a day (TID) | ORAL | 2 refills | Status: AC | PRN

## 2018-10-20 MED ORDER — ATORVASTATIN CALCIUM 10 MG PO TABS: 10.0000 mg | ORAL_TABLET | Freq: Every day | ORAL | 1 refills | Status: AC

## 2018-10-20 MED ORDER — MAGNESIUM OXIDE 400 MG PO TABS: 400.0000 mg | ORAL_TABLET | Freq: Every day | ORAL | 1 refills | Status: AC

## 2018-10-20 MED ORDER — VITAMIN B-1 100 MG PO TABS: 100.0000 mg | ORAL_TABLET | Freq: Every day | ORAL | 1 refills | Status: DC

## 2018-10-20 MED ORDER — POTASSIUM CHLORIDE ER 10 MEQ PO TBCR: 10.0000 meq | EXTENDED_RELEASE_TABLET | Freq: Every day | ORAL | 1 refills | Status: DC

## 2018-10-20 MED ORDER — VITAMIN B-1 100 MG PO TABS: 100.0000 mg | ORAL_TABLET | Freq: Every day | ORAL | 1 refills | Status: AC

## 2018-10-20 MED ORDER — POTASSIUM CHLORIDE ER 10 MEQ PO TBCR: 10.0000 meq | EXTENDED_RELEASE_TABLET | Freq: Every day | ORAL | 1 refills | Status: AC

## 2018-10-20 MED ORDER — FUROSEMIDE 40 MG PO TABS: ORAL_TABLET | ORAL | 2 refills | Status: AC

## 2018-10-20 MED ORDER — FERROUS SULFATE 325 (65 FE) MG PO TABS: 325.0000 mg | ORAL_TABLET | Freq: Three times a day (TID) | ORAL | 1 refills | Status: AC

## 2018-10-20 MED ORDER — FOLIC ACID 1 MG PO TABS: 1.0000 mg | ORAL_TABLET | Freq: Every day | ORAL | 1 refills | Status: AC

## 2018-10-20 NOTE — Telephone Encounter (Signed)
All Rx's sent as requested to [facility].

## 2018-10-20 NOTE — Telephone Encounter (Signed)
Copied from [place] [phone]. Topic: Quick Communication - See Telephone Encounter >> Oct 20, 2018  2:00 PM Hewitt Shorts wrote: [place] is calling to say that the 8 medications [facility] need new rx's sent to them by fax [phone] [facility] [address] [facility] not transfer [facility] rxs over  The meds are potassium, thiamine, lipitor, furosemide , magnesium oxide, ferrous sulfate , folic acid, methocarbamol  Best number to [facility] [phone]

## 2018-10-26 ENCOUNTER — Other Ambulatory Visit: Payer: Self-pay | Admitting: Family Medicine

## 2018-10-26 DIAGNOSIS — M25551 Pain in right hip: Secondary | ICD-10-CM

## 2018-10-26 NOTE — Telephone Encounter (Signed)
Copied from [place] [phone]. Topic: Quick Communication - Rx Refill/Question >> Oct 26, 2018  3:03 PM Virl Axe D wrote: Medication: oxyCODONE (OXY IR/ROXICODONE) 5 MG immediate release tablet   Has the patient contacted their pharmacy? Yes.   (Agent: If no, request that the patient contact the pharmacy for the refill.) (Agent: If yes, when and what did the pharmacy advise?)  Preferred Pharmacy (with phone number or street name): [id number] [place], [place]. [phone] (Phone) [phone] (Fax)    Agent: Please be advised that RX refills may take up to 3 business days. We ask that you follow-up with your pharmacy.

## 2018-10-27 ENCOUNTER — Telehealth: Payer: Self-pay | Admitting: Behavioral Health

## 2018-10-27 ENCOUNTER — Other Ambulatory Visit: Payer: Self-pay | Admitting: Behavioral Health

## 2018-10-27 DIAGNOSIS — E43 Unspecified severe protein-calorie malnutrition: Secondary | ICD-10-CM | POA: Diagnosis not present

## 2018-10-27 DIAGNOSIS — Z96649 Presence of unspecified artificial hip joint: Secondary | ICD-10-CM

## 2018-10-27 DIAGNOSIS — N400 Enlarged prostate without lower urinary tract symptoms: Secondary | ICD-10-CM | POA: Diagnosis not present

## 2018-10-27 DIAGNOSIS — I110 Hypertensive heart disease with heart failure: Secondary | ICD-10-CM | POA: Diagnosis not present

## 2018-10-27 DIAGNOSIS — F339 Major depressive disorder, recurrent, unspecified: Secondary | ICD-10-CM | POA: Diagnosis not present

## 2018-10-27 DIAGNOSIS — I5033 Acute on chronic diastolic (congestive) heart failure: Secondary | ICD-10-CM | POA: Diagnosis not present

## 2018-10-27 DIAGNOSIS — T8459XD Infection and inflammatory reaction due to other internal joint prosthesis, subsequent encounter: Secondary | ICD-10-CM

## 2018-10-27 DIAGNOSIS — A4901 Methicillin susceptible Staphylococcus aureus infection, unspecified site: Secondary | ICD-10-CM

## 2018-10-27 MED ORDER — DOXYCYCLINE HYCLATE 100 MG PO TABS: ORAL_TABLET | ORAL | 0 refills | Status: AC

## 2018-10-27 NOTE — Telephone Encounter (Signed)
Please continue thanks

## 2018-10-27 NOTE — Telephone Encounter (Signed)
[place] was calling to see if it was ok to refill Doxycycline.  Patient does not have doxycycline active on his medication profile.  Last Doxycycline prescription stated take until surgery an it ended 09/14/2018. Per last note from Dr. Johnnye Sima it stated patient was to be on Doxycycline indefinitely.  Will reach out to Dr. Johnnye Sima for clarification. Pricilla Riffle RN

## 2018-10-27 NOTE — Telephone Encounter (Signed)
Requested medication (s) are due for refill today: yes  Requested medication (s) are on the active medication list: yes  Last refill:  09/25/18 #30  Future visit scheduled: yes  Notes to clinic:  Unable to refill per protocol    Requested Prescriptions  Pending Prescriptions Disp Refills   oxyCODONE (OXY IR/ROXICODONE) 5 MG immediate release tablet 30 tablet 0    Sig: Take 0.5 tablets (2.5 mg total) by mouth 2 (two) times daily as needed for moderate pain. *Hold for sedation*     Not Delegated - Analgesics:  Opioid Agonists Failed - 10/27/2018  4:04 PM      Failed - This refill cannot be delegated      Failed - Urine Drug Screen completed in last 360 days.      Passed - Valid encounter within last 6 months    Recent Outpatient Visits          1 month ago Right hip pain   [facility], Mortimer Fries, MD   1 month ago Essential hypertension   [facility] E[facility] Hal, Mortimer Fries, MD   6 years ago HTN (hypertension)   Primary Care at [facility], Benn Moulder, MD      Future Appointments            In 3 days Ethelene Hal Mortimer Fries, MD [facility] [place], [place]   In 8 months Johnnye Sima, Doroteo Bradford, MD [facility] for Infect[facility], [facility]

## 2018-10-27 NOTE — Telephone Encounter (Signed)
Pt called in to follow up on his medication refill. Advised pt to allow up to 3 business days, pt understood, and said he's completley out.

## 2018-10-28 NOTE — Telephone Encounter (Signed)
WK-Plz see refill req/thx dmf °

## 2018-10-30 ENCOUNTER — Ambulatory Visit (INDEPENDENT_AMBULATORY_CARE_PROVIDER_SITE_OTHER): Payer: PPO | Admitting: Family Medicine

## 2018-10-30 ENCOUNTER — Encounter: Payer: Self-pay | Admitting: Family Medicine

## 2018-10-30 VITALS — BP 126/80 | HR 81 | Ht 68.0 in

## 2018-10-30 DIAGNOSIS — Z96649 Presence of unspecified artificial hip joint: Secondary | ICD-10-CM | POA: Diagnosis not present

## 2018-10-30 DIAGNOSIS — M170 Bilateral primary osteoarthritis of knee: Secondary | ICD-10-CM | POA: Diagnosis not present

## 2018-10-30 DIAGNOSIS — G8929 Other chronic pain: Secondary | ICD-10-CM

## 2018-10-30 DIAGNOSIS — M25551 Pain in right hip: Secondary | ICD-10-CM

## 2018-10-30 DIAGNOSIS — T8459XD Infection and inflammatory reaction due to other internal joint prosthesis, subsequent encounter: Secondary | ICD-10-CM

## 2018-10-30 MED ORDER — OXYCODONE HCL 5 MG PO TABS: 2.5000 mg | ORAL_TABLET | Freq: Two times a day (BID) | ORAL | 0 refills | Status: AC | PRN
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
## 2018-11-02 LAB — PAIN MGMT, PROFILE 8 W/CONF, U
6 ACETYLMORPHINE: NEGATIVE ng/mL (ref <10)
ALCOHOL METABOLITES: NEGATIVE ng/mL (ref <500)
ALPHAHYDROXYMIDAZOLAM: NEGATIVE ng/mL (ref <50)
AMINOCLONAZEPAM: NEGATIVE ng/mL (ref <25)
Alphahydroxyalprazolam: NEGATIVE ng/mL (ref <25)
Alphahydroxytriazolam: NEGATIVE ng/mL (ref <50)
Amphetamines: NEGATIVE ng/mL (ref <500)
BENZODIAZEPINES: NEGATIVE ng/mL (ref <100)
Buprenorphine, Urine: NEGATIVE ng/mL (ref <5)
COCAINE METABOLITE: NEGATIVE ng/mL (ref <150)
Creatinine: 98.1 mg/dL
HYDROXYETHYLFLURAZEPAM: NEGATIVE ng/mL (ref <50)
LORAZEPAM: NEGATIVE ng/mL (ref <50)
MARIJUANA METABOLITE: NEGATIVE ng/mL (ref <20)
MDMA: NEGATIVE ng/mL (ref <500)
NOROXYCODONE: 244 ng/mL — AB (ref <50)
Nordiazepam: NEGATIVE ng/mL (ref <50)
OXIDANT: NEGATIVE ug/mL (ref <200)
OXYCODONE: 128 ng/mL — AB (ref <50)
Opiates: NEGATIVE ng/mL (ref <100)
Oxazepam: NEGATIVE ng/mL (ref <50)
Oxycodone: POSITIVE ng/mL — AB (ref <100)
Oxymorphone: 358 ng/mL — ABNORMAL HIGH (ref <50)
TEMAZEPAM: NEGATIVE ng/mL (ref <50)
pH: 6.14 (ref 4.5–9.0)

## 2018-11-04 DIAGNOSIS — F339 Major depressive disorder, recurrent, unspecified: Secondary | ICD-10-CM | POA: Diagnosis not present

## 2018-11-04 DIAGNOSIS — E43 Unspecified severe protein-calorie malnutrition: Secondary | ICD-10-CM | POA: Diagnosis not present

## 2018-11-04 DIAGNOSIS — I5033 Acute on chronic diastolic (congestive) heart failure: Secondary | ICD-10-CM | POA: Diagnosis not present

## 2018-11-04 DIAGNOSIS — I110 Hypertensive heart disease with heart failure: Secondary | ICD-10-CM | POA: Diagnosis not present

## 2018-11-04 DIAGNOSIS — N400 Enlarged prostate without lower urinary tract symptoms: Secondary | ICD-10-CM | POA: Diagnosis not present

## 2018-11-10 ENCOUNTER — Ambulatory Visit (INDEPENDENT_AMBULATORY_CARE_PROVIDER_SITE_OTHER): Payer: PPO | Admitting: Family Medicine

## 2018-11-10 ENCOUNTER — Ambulatory Visit (INDEPENDENT_AMBULATORY_CARE_PROVIDER_SITE_OTHER): Payer: PPO

## 2018-11-10 VITALS — BP 110/60 | HR 102 | Temp 98.2°F

## 2018-11-10 DIAGNOSIS — M170 Bilateral primary osteoarthritis of knee: Secondary | ICD-10-CM
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
## 2018-11-10 NOTE — Patient Instructions (Signed)
Nice to meet you  Please ice the knees. 20 minutes at a time for 3-4 times daily  Please try to stay active  Please let me know if the injection doesn't help and we can try gel injections.  Please see me back in 3-4 weeks if no better

## 2018-11-11 DIAGNOSIS — F339 Major depressive disorder, recurrent, unspecified: Secondary | ICD-10-CM | POA: Diagnosis not present

## 2018-11-11 DIAGNOSIS — I5033 Acute on chronic diastolic (congestive) heart failure: Secondary | ICD-10-CM | POA: Diagnosis not present

## 2018-11-11 DIAGNOSIS — N400 Enlarged prostate without lower urinary tract symptoms: Secondary | ICD-10-CM | POA: Diagnosis not present

## 2018-11-11 DIAGNOSIS — I110 Hypertensive heart disease with heart failure: Secondary | ICD-10-CM | POA: Diagnosis not present

## 2018-11-11 DIAGNOSIS — E43 Unspecified severe protein-calorie malnutrition: Secondary | ICD-10-CM | POA: Diagnosis not present

## 2018-11-11 NOTE — Assessment & Plan Note (Signed)
Acute on chronic pain that is likely related to degenerative changes. No inciting event.  - b/l injections today  - counseled on supportive care - can consider gel injections and updated imaging

## 2018-12-02 DIAGNOSIS — R0681 Apnea, not elsewhere classified: Secondary | ICD-10-CM | POA: Diagnosis not present

## 2018-12-02 DIAGNOSIS — R4020 Unspecified coma: Secondary | ICD-10-CM | POA: Diagnosis not present

## 2018-12-02 DIAGNOSIS — I499 Cardiac arrhythmia, unspecified: Secondary | ICD-10-CM | POA: Diagnosis not present

## 2018-12-03 ENCOUNTER — Telehealth: Payer: Self-pay | Admitting: Internal Medicine

## 2018-12-03 NOTE — Telephone Encounter (Signed)
Dr. 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 He had been feeling ill x a few days and death not suspicious. AVS from 07/2018 asking Dr. Ethelene Hal to fill out death certificate so pt can be cremated   Agreed to this for him if pt seen w/in the last 1 year last seen 07/2018

## 2018-12-09 DIAGNOSIS — 419620001 Death: Secondary | SNOMED CT | POA: Diagnosis not present

## 2018-12-09 DEATH — deceased

## 2018-12-21 ENCOUNTER — Ambulatory Visit: Payer: PPO | Admitting: Family Medicine

## 2019-01-01 ENCOUNTER — Ambulatory Visit: Payer: PPO | Admitting: Family Medicine

## 2019-06-29 ENCOUNTER — Encounter: Payer: Self-pay | Admitting: Gastroenterology

## 2019-07-13 ENCOUNTER — Ambulatory Visit: Payer: PPO | Admitting: Infectious Diseases
